# Patient Record
Sex: Female | Born: 1995 | Race: White | Hispanic: No | Marital: Married | Smoking: Former smoker
Health system: Southern US, Community
[De-identification: ages and names within clinical notes are randomized; demographics above are authoritative.]

## PROBLEM LIST (undated history)

## (undated) ENCOUNTER — Inpatient Hospital Stay: Payer: Self-pay

## (undated) DIAGNOSIS — D649 Anemia, unspecified: Secondary | ICD-10-CM

## (undated) DIAGNOSIS — R519 Headache, unspecified: Secondary | ICD-10-CM

## (undated) DIAGNOSIS — G90A Postural orthostatic tachycardia syndrome (POTS): Secondary | ICD-10-CM

## (undated) DIAGNOSIS — Z8489 Family history of other specified conditions: Secondary | ICD-10-CM

## (undated) DIAGNOSIS — O139 Gestational [pregnancy-induced] hypertension without significant proteinuria, unspecified trimester: Secondary | ICD-10-CM

## (undated) DIAGNOSIS — G35D Multiple sclerosis, unspecified: Secondary | ICD-10-CM

## (undated) DIAGNOSIS — F419 Anxiety disorder, unspecified: Secondary | ICD-10-CM

## (undated) DIAGNOSIS — E7800 Pure hypercholesterolemia, unspecified: Secondary | ICD-10-CM

## (undated) DIAGNOSIS — F329 Major depressive disorder, single episode, unspecified: Secondary | ICD-10-CM

## (undated) DIAGNOSIS — Z227 Latent tuberculosis: Secondary | ICD-10-CM

## (undated) DIAGNOSIS — G35 Multiple sclerosis: Secondary | ICD-10-CM

## (undated) HISTORY — PX: NO PAST SURGERIES: SHX2092

## (undated) HISTORY — DX: Gestational (pregnancy-induced) hypertension without significant proteinuria, unspecified trimester: O13.9

---
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
## 2018-01-07 ENCOUNTER — Emergency Department: Payer: Self-pay

## 2018-01-07 ENCOUNTER — Emergency Department
Admission: EM | Admit: 2018-01-07 | Discharge: 2018-01-07 | Disposition: A | Discharge status: Home / Self Care | Payer: Self-pay | Attending: Emergency Medicine | Admitting: Emergency Medicine

## 2018-01-07 DIAGNOSIS — Z87891 Personal history of nicotine dependence: Secondary | ICD-10-CM | POA: Insufficient documentation

## 2018-01-07 DIAGNOSIS — Z79899 Other long term (current) drug therapy: Secondary | ICD-10-CM | POA: Insufficient documentation

## 2018-01-07 DIAGNOSIS — R1031 Right lower quadrant pain: Secondary | ICD-10-CM | POA: Insufficient documentation

## 2018-01-07 DIAGNOSIS — I10 Essential (primary) hypertension: Secondary | ICD-10-CM | POA: Insufficient documentation

## 2018-01-07 LAB — COMPREHENSIVE METABOLIC PANEL
ALT: 29 U/L (ref 14–54)
AST: 23 U/L (ref 15–41)
Albumin: 4.1 g/dL (ref 3.5–5.0)
Alkaline Phosphatase: 75 U/L (ref 38–126)
Anion gap: 7 (ref 5–15)
BILIRUBIN TOTAL: 0.6 mg/dL (ref 0.3–1.2)
BUN: 9 mg/dL (ref 6–20)
CALCIUM: 8.8 mg/dL — AB (ref 8.9–10.3)
CHLORIDE: 106 mmol/L (ref 101–111)
CO2: 25 mmol/L (ref 22–32)
CREATININE: 0.55 mg/dL (ref 0.44–1.00)
Glucose, Bld: 98 mg/dL (ref 65–99)
Potassium: 3.6 mmol/L (ref 3.5–5.1)
Sodium: 138 mmol/L (ref 135–145)
TOTAL PROTEIN: 7.6 g/dL (ref 6.5–8.1)

## 2018-01-07 LAB — URINALYSIS, COMPLETE (UACMP) WITH MICROSCOPIC
Bilirubin Urine: NEGATIVE
GLUCOSE, UA: NEGATIVE mg/dL
HGB URINE DIPSTICK: NEGATIVE
KETONES UR: NEGATIVE mg/dL
NITRITE: NEGATIVE
PH: 6 (ref 5.0–8.0)
PROTEIN: NEGATIVE mg/dL
Specific Gravity, Urine: 1.011 (ref 1.005–1.030)

## 2018-01-07 LAB — CBC
HCT: 39.8 % (ref 35.0–47.0)
Hemoglobin: 13.1 g/dL (ref 12.0–16.0)
MCH: 27.5 pg (ref 26.0–34.0)
MCHC: 33 g/dL (ref 32.0–36.0)
MCV: 83.3 fL (ref 80.0–100.0)
PLATELETS: 295 10*3/uL (ref 150–440)
RBC: 4.77 MIL/uL (ref 3.80–5.20)
RDW: 13.1 % (ref 11.5–14.5)
WBC: 13.2 10*3/uL — AB (ref 3.6–11.0)

## 2018-01-07 LAB — WET PREP, GENITAL
Clue Cells Wet Prep HPF POC: NONE SEEN
SPERM: NONE SEEN
Trich, Wet Prep: NONE SEEN
YEAST WET PREP: NONE SEEN

## 2018-01-07 LAB — LIPASE, BLOOD: LIPASE: 42 U/L (ref 11–51)

## 2018-01-07 LAB — POCT PREGNANCY, URINE: Preg Test, Ur: NEGATIVE

## 2018-01-07 MED ORDER — TRAMADOL HCL 50 MG PO TABS: 50.0000 mg | ORAL_TABLET | Freq: Four times a day (QID) | ORAL | 0 refills | Status: AC | PRN

## 2018-01-07 MED ORDER — IOPAMIDOL (ISOVUE-300) INJECTION 61%
30.0000 mL | Freq: Once | INTRAVENOUS | Status: AC | PRN
  Administered 2018-01-07: 30 mL via ORAL
  Filled 2018-01-07: qty 30

## 2018-01-07 MED ORDER — IOPAMIDOL (ISOVUE-300) INJECTION 61%
100.0000 mL | Freq: Once | INTRAVENOUS | Status: AC | PRN
  Administered 2018-01-07: 100 mL via INTRAVENOUS
  Filled 2018-01-07: qty 100

## 2018-01-07 NOTE — ED Notes (Signed)
Patient transported to CT 

## 2018-01-07 NOTE — ED Provider Notes (Signed)
[facility] Emergency Department Provider Note   ____________________________________________    I have reviewed the triage vital signs and the nursing notes.   HISTORY  Chief Complaint Abdominal Pain     HPI Brenda Fuentes is a 22 y.o. female who presents with complaints of right lower quadrant abdominal pain which began gradually and has been relatively constant over the last 2 weeks but is been mild, today it seemed to be more moderate so the patient presented to the emergency department.  She reports normal stools.  No nausea or vomiting.  No history of abdominal surgery.  No vaginal discharge or dysuria.  Has not taken anything for this.   Past Medical History:  Diagnosis Date  . Anxiety   . Depression 2016  . Hypercholesteremia 2009  . Hypertension     Patient Active Problem List   Diagnosis Date Noted  . Indication for care in labor and delivery, antepartum 07/04/2016  . Indication for care in labor or delivery 07/02/2016  . Gestational hypertension w/o significant proteinuria in 3rd trimester 06/16/2016  . Abdominal pain in pregnancy 04/07/2016    Past Surgical History:  Procedure Laterality Date  . NO PAST SURGERIES      Prior to Admission medications   Medication Sig Start Date End Date Taking? Authorizing Provider  diclofenac (VOLTAREN) 75 MG EC tablet Take 1 tablet (75 mg total) by mouth 2 (two) times daily. 12/12/16   Menshew, Charlesetta Ivory, PA-C  ferrous sulfate 325 (65 FE) MG tablet Take 1 tablet (325 mg total) by mouth daily with breakfast. 07/06/16   Sigmon, Scarlette Slice, CNM  ibuprofen (ADVIL,MOTRIN) 600 MG tablet Take 1 tablet (600 mg total) by mouth every 6 (six) hours. 07/06/16   Karena Addison, CNM  oxyCODONE-acetaminophen (PERCOCET/ROXICET) 5-325 MG tablet Take 1-2 tablets by mouth every 4 (four) hours as needed (pain scale > 7). 07/06/16   Sigmon, Scarlette Slice, CNM  traMADol (ULTRAM) 50 MG tablet Take 1 tablet (50 mg  total) by mouth every 6 (six) hours as needed. 01/07/18 01/07/19  Jene Every, MD     Allergies Penicillins  History reviewed. No pertinent family history.  Social History Social History   Tobacco Use  . Smoking status: Former Smoker    Packs/day: 0.50  . Smokeless tobacco: Never Used  Substance Use Topics  . Alcohol use: No  . Drug use: No    Review of Systems  Constitutional: No fever/chills Eyes: No visual changes.  ENT: No sore throat. Cardiovascular: Denies chest pain. Respiratory: Denies shortness of breath. Gastrointestinal: As described above Genitourinary: Negative for dysuria. Musculoskeletal: Negative for back pain. Skin: Negative for rash. Neurological: Negative for headaches    ____________________________________________   PHYSICAL EXAM:  VITAL SIGNS: ED Triage Vitals  Enc Vitals Group     BP 01/07/18 1743 (!) 146/99     Pulse Rate 01/07/18 1743 (!) 106     Resp 01/07/18 1743 12     Temp 01/07/18 1743 98.5 F (36.9 C)     Temp Source 01/07/18 1743 Oral     SpO2 01/07/18 1743 98 %     Weight 01/07/18 1744 92.1 kg (203 lb)     Height 01/07/18 1744 1.676 m (5\' 6" )     Head Circumference --      Peak Flow --      Pain Score 01/07/18 1744 7     Pain Loc --      Pain Edu? --  Excl. in GC? --     Constitutional: Alert and oriented. No acute distress. Pleasant and interactive Eyes: Conjunctivae are normal.  Head: Atraumatic. Nose: No congestion/rhinnorhea. Mouth/Throat: Mucous membranes are moist.   Neck:  Painless ROM Cardiovascular: Normal rate, regular rhythm.   Good peripheral circulation. Respiratory: Normal respiratory effort.  No retractions.  Gastrointestinal: Tenderness palpation over McBurney's point which is mild, abdomen is soft no distention.  . Genitourinary: Pelvic exam unremarkable, no CMT or cervicitis Musculoskeletal:  Warm and well perfused Neurologic:  Normal speech and language. No gross focal neurologic deficits  are appreciated.  Skin:  Skin is warm, dry and intact. No rash noted. Psychiatric: Mood and affect are normal. Speech and behavior are normal.  ____________________________________________   LABS (all labs ordered are listed, but only abnormal results are displayed)  Labs Reviewed  WET PREP, GENITAL - Abnormal; Notable for the following components:      Result Value   WBC, Wet Prep HPF POC FEW (*)    All other components within normal limits  COMPREHENSIVE METABOLIC PANEL - Abnormal; Notable for the following components:   Calcium 8.8 (*)    All other components within normal limits  CBC - Abnormal; Notable for the following components:   WBC 13.2 (*)    All other components within normal limits  URINALYSIS, COMPLETE (UACMP) WITH MICROSCOPIC - Abnormal; Notable for the following components:   Color, Urine YELLOW (*)    Leukocytes, UA SMALL (*)    Bacteria, UA RARE (*)    Squamous Epithelial / LPF 0-5 (*)    All other components within normal limits  LIPASE, BLOOD  POC URINE PREG, ED  POCT PREGNANCY, URINE   ____________________________________________  EKG  None ____________________________________________  RADIOLOGY  CT abdomen pelvis negative for appendicitis ____________________________________________   PROCEDURES  Procedure(s) performed: No  Procedures   Critical Care performed: No ____________________________________________   INITIAL IMPRESSION / ASSESSMENT AND PLAN / ED COURSE  Pertinent labs & imaging results that were available during my care of the patient were reviewed by me and considered in my medical decision making (see chart for details).  Patient presented with right lower quadrant abdominal pain, time course not critically consistent with appendicitis however she did have mild tenderness to palpation at McBurney's point so CT scan performed which was unremarkable.  Pelvic exam reassuring as well.  Lab work normal.  Will treat with p.o.  analgesics and the patient follow-up with PCP for further workup as needed.  Return precautions discussed.  Mildly elevated white blood cell count nonspecific    ____________________________________________   FINAL CLINICAL IMPRESSION(S) / ED DIAGNOSES  Final diagnoses:  Right lower quadrant abdominal pain        Note:  This document was prepared using Dragon voice recognition software and may include unintentional dictation errors.    Jene Every, MD 01/07/18 2242

## 2018-01-07 NOTE — ED Triage Notes (Signed)
Pt presents via POV c/o RLQ pain x2 weeks. Reports some nausea, denies emesis.

## 2018-12-12 NOTE — L&D Delivery Note (Signed)
Delivery Note At  1041 am a viable and healthy female "[name]"  was delivered via  (Presentation:ROA ;  ).  APGAR: 8,9 ; weight pending skin-to-skin  .   Placenta status: delivered intact with 3 vessel  Cord:  with the following complications: none  Anesthesia:  epidural Episiotomy:  none Lacerations:  none Suture Repair: NA Est. Blood Loss (mL):    Mom to postpartum.  Baby to Couplet care / Skin to Skin.  Melody N Shambley 08/20/2019, 10:55 AM

## 2019-02-04 ENCOUNTER — Ambulatory Visit (INDEPENDENT_AMBULATORY_CARE_PROVIDER_SITE_OTHER): Payer: Medicaid Other | Admitting: Certified Nurse Midwife

## 2019-02-04 ENCOUNTER — Other Ambulatory Visit: Payer: Self-pay | Admitting: Certified Nurse Midwife

## 2019-02-04 VITALS — BP 121/81 | HR 87 | Ht 66.0 in | Wt 208.1 lb

## 2019-02-04 DIAGNOSIS — Z3401 Encounter for supervision of normal first pregnancy, first trimester: Secondary | ICD-10-CM

## 2019-02-04 LAB — OB RESULTS CONSOLE VARICELLA ZOSTER ANTIBODY, IGG: Varicella: NON-IMMUNE/NOT IMMUNE

## 2019-02-04 MED ORDER — DOXYLAMINE-PYRIDOXINE 10-10 MG PO TBEC: DELAYED_RELEASE_TABLET | ORAL | 1 refills | Status: DC

## 2019-02-04 NOTE — Progress Notes (Signed)
Brenda Fuentes presents for NOB nurse interview visit. Pregnancy confirmation done [facility] __1/20/20_______. G-2. P-1001. Pregnancy education material explained and given. __1__ cats in home. NOB labs ordered. TSH/HbgA1c ordered due to BMI 30 or greater. Sickle cell ordered due to patient's race. HIV labs and drug screen were explained and ordered. PNV encouraged. Genetic screening options discussed. Genetic testing:Unsure. Patient may discuss with the provider. Patient to follow up with provider in __2__ weeks for NOB physical. All questions answered. Patient wants to see the midwife side. Patient had hypertension during last pregnancy. She delivered 36 weeks.

## 2019-02-05 LAB — PARVOVIRUS B19 ANTIBODY, IGG AND IGM
Parvovirus B19 IgG: 0.3 index (ref 0.0–0.8)
Parvovirus B19 IgM: 0.2 index (ref 0.0–0.8)

## 2019-02-05 LAB — MONITOR DRUG PROFILE 14(MW)
AMPHETAMINE SCREEN URINE: NEGATIVE ng/mL
BARBITURATE SCREEN URINE: NEGATIVE ng/mL
BENZODIAZEPINE SCREEN, URINE: NEGATIVE ng/mL
BUPRENORPHINE, URINE: NEGATIVE ng/mL
CANNABINOIDS UR QL SCN: NEGATIVE ng/mL
COCAINE(METAB.)SCREEN, URINE: NEGATIVE ng/mL
Creatinine(Crt), U: 178 mg/dL (ref 20.0–300.0)
FENTANYL, URINE: NEGATIVE pg/mL
MEPERIDINE SCREEN, URINE: NEGATIVE ng/mL
Methadone Screen, Urine: NEGATIVE ng/mL
OPIATE SCREEN URINE: NEGATIVE ng/mL
OXYCODONE+OXYMORPHONE UR QL SCN: NEGATIVE ng/mL
PHENCYCLIDINE QUANTITATIVE URINE: NEGATIVE ng/mL
PROPOXYPHENE SCREEN URINE: NEGATIVE ng/mL
Ph of Urine: 6.2 (ref 4.5–8.9)
SPECIFIC GRAVITY: 1.023
TRAMADOL SCREEN, URINE: NEGATIVE ng/mL

## 2019-02-05 LAB — NICOTINE SCREEN, URINE: Cotinine Ql Scrn, Ur: NEGATIVE ng/mL

## 2019-02-05 LAB — URINALYSIS, ROUTINE W REFLEX MICROSCOPIC
BILIRUBIN UA: NEGATIVE
GLUCOSE, UA: NEGATIVE
Ketones, UA: NEGATIVE
LEUKOCYTES UA: NEGATIVE
Nitrite, UA: NEGATIVE
PH UA: 6.5 (ref 5.0–7.5)
RBC UA: NEGATIVE
Specific Gravity, UA: 1.026 (ref 1.005–1.030)
UUROB: 0.2 mg/dL (ref 0.2–1.0)

## 2019-02-05 LAB — CBC
HEMOGLOBIN: 12.8 g/dL (ref 11.1–15.9)
Hematocrit: 39.1 % (ref 34.0–46.6)
MCH: 27.6 pg (ref 26.6–33.0)
MCHC: 32.7 g/dL (ref 31.5–35.7)
MCV: 84 fL (ref 79–97)
PLATELETS: 294 10*3/uL (ref 150–450)
RBC: 4.63 x10E6/uL (ref 3.77–5.28)
RDW: 12.8 % (ref 11.7–15.4)
WBC: 8.6 10*3/uL (ref 3.4–10.8)

## 2019-02-05 LAB — ANTIBODY SCREEN: Antibody Screen: NEGATIVE

## 2019-02-05 LAB — RUBELLA SCREEN: RUBELLA: 1.38 {index} (ref >0.99)

## 2019-02-05 LAB — HEMOGLOBIN A1C
ESTIMATED AVERAGE GLUCOSE: 100 mg/dL
HEMOGLOBIN A1C: 5.1 % (ref 4.8–5.6)

## 2019-02-05 LAB — GC/CHLAMYDIA PROBE AMP
Chlamydia trachomatis, NAA: NEGATIVE
NEISSERIA GONORRHOEAE BY PCR: NEGATIVE

## 2019-02-05 LAB — ABO AND RH: RH TYPE: POSITIVE

## 2019-02-05 LAB — VARICELLA ZOSTER ANTIBODY, IGG: Varicella zoster IgG: <135 index — ABNORMAL LOW (ref >165)

## 2019-02-05 LAB — HEPATITIS B SURFACE ANTIGEN: Hepatitis B Surface Ag: NEGATIVE

## 2019-02-05 LAB — RPR: RPR: NONREACTIVE

## 2019-02-05 LAB — HIV ANTIBODY (ROUTINE TESTING W REFLEX): HIV Screen 4th Generation wRfx: NONREACTIVE

## 2019-02-06 LAB — CULTURE, OB URINE

## 2019-02-06 LAB — URINE CULTURE, OB REFLEX

## 2019-02-18 ENCOUNTER — Ambulatory Visit (INDEPENDENT_AMBULATORY_CARE_PROVIDER_SITE_OTHER): Payer: Medicaid Other | Admitting: Certified Nurse Midwife

## 2019-02-18 ENCOUNTER — Other Ambulatory Visit (HOSPITAL_COMMUNITY)
Admission: RE | Admit: 2019-02-18 | Discharge: 2019-02-18 | Disposition: A | Discharge status: Home / Self Care | Payer: Medicaid Other | Source: Ambulatory Visit | Attending: Certified Nurse Midwife | Admitting: Certified Nurse Midwife

## 2019-02-18 ENCOUNTER — Encounter: Payer: Self-pay | Admitting: Certified Nurse Midwife

## 2019-02-18 VITALS — BP 121/82 | HR 83 | Wt 205.2 lb

## 2019-02-18 DIAGNOSIS — Z8751 Personal history of pre-term labor: Secondary | ICD-10-CM | POA: Insufficient documentation

## 2019-02-18 DIAGNOSIS — Z3401 Encounter for supervision of normal first pregnancy, first trimester: Secondary | ICD-10-CM | POA: Diagnosis present

## 2019-02-18 DIAGNOSIS — O09291 Supervision of pregnancy with other poor reproductive or obstetric history, first trimester: Secondary | ICD-10-CM

## 2019-02-18 DIAGNOSIS — O09211 Supervision of pregnancy with history of pre-term labor, first trimester: Secondary | ICD-10-CM

## 2019-02-18 DIAGNOSIS — Z3A10 10 weeks gestation of pregnancy: Secondary | ICD-10-CM | POA: Diagnosis not present

## 2019-02-18 DIAGNOSIS — O09899 Supervision of other high risk pregnancies, unspecified trimester: Secondary | ICD-10-CM | POA: Insufficient documentation

## 2019-02-18 DIAGNOSIS — Z8759 Personal history of other complications of pregnancy, childbirth and the puerperium: Secondary | ICD-10-CM | POA: Insufficient documentation

## 2019-02-18 LAB — POCT URINALYSIS DIPSTICK OB
BILIRUBIN UA: NEGATIVE
Glucose, UA: NEGATIVE
Leukocytes, UA: NEGATIVE
NITRITE UA: NEGATIVE
PROTEIN: NEGATIVE
SPEC GRAV UA: 1.025 (ref 1.010–1.025)
Urobilinogen, UA: 0.2 E.U./dL
pH, UA: 5 (ref 5.0–8.0)

## 2019-02-18 NOTE — Progress Notes (Signed)
NEW OB HISTORY AND PHYSICAL  SUBJECTIVE:       Brenda Fuentes is a 23 y.o. [id number] female, Patient's last menstrual period was 12/04/2018., Estimated Date of Delivery: 09/10/19, [redacted]w[redacted]d, presents today for establishment of Prenatal Care. She has no unusual complaints    Gynecologic History Patient's last menstrual period was 12/04/2018. Normal Contraception: none Last Pap: n/a.  Body mass index is 33.12 kg/m.  Obstetric History OB History  Gravida Para Term Preterm AB Living  2 1   1   1   SAB TAB Ectopic Multiple Live Births        0 1    # Outcome Date GA Lbr Len/2nd Weight Sex Delivery Anes PTL Lv  2 Current           1 Preterm 07/04/16 [redacted]w[redacted]d 14:25 / 00:23 5 lb 10.3 oz (2.56 kg) M Vag-Spont EPI  LIV    Past Medical History:  Diagnosis Date  . Anxiety   . Depression 2016  . Hypercholesteremia 2009  . Hypertension     Past Surgical History:  Procedure Laterality Date  . NO PAST SURGERIES      Current Outpatient Medications on File Prior to Visit  Medication Sig Dispense Refill  . Doxylamine-Pyridoxine (DICLEGIS) 10-10 MG TBEC Take one daily for nausea. 60 tablet 1  . Prenatal Vit-Fe Fumarate-FA (PRENATAL MULTIVITAMIN) TABS tablet Take 1 tablet by mouth daily at 12 noon.     No current facility-administered medications on file prior to visit.     Allergies  Allergen Reactions  . Penicillins Hives    Social History   Socioeconomic History  . Marital status: Married    Spouse name: Not on file  . Number of children: Not on file  . Years of education: Not on file  . Highest education level: Not on file  Occupational History  . Not on file  Social Needs  . Financial resource strain: Not on file  . Food insecurity:    Worry: Not on file    Inability: Not on file  . Transportation needs:    Medical: Not on file    Non-medical: Not on file  Tobacco Use  . Smoking status: Former Smoker    Packs/day: 0.50  . Smokeless tobacco: Never Used  Substance and  Sexual Activity  . Alcohol use: No  . Drug use: No  . Sexual activity: Yes  Lifestyle  . Physical activity:    Days per week: Not on file    Minutes per session: Not on file  . Stress: Not on file  Relationships  . Social connections:    Talks on phone: Not on file    Gets together: Not on file    Attends religious service: Not on file    Active member of club or organization: Not on file    Attends meetings of clubs or organizations: Not on file    Relationship status: Not on file  . Intimate partner violence:    Fear of current or ex partner: Not on file    Emotionally abused: Not on file    Physically abused: Not on file    Forced sexual activity: Not on file  Other Topics Concern  . Not on file  Social History Narrative  . Not on file    Family History  Problem Relation Age of Onset  . Hyperlipidemia Mother   . Heart failure Father   . Diabetes Father     The following portions of the  patient's history were reviewed and updated as appropriate: allergies, current medications, past OB history, past medical history, past surgical history, past family history, past social history, and problem list.    OBJECTIVE: Initial Physical Exam (New OB)  GENERAL APPEARANCE: alert, well appearing HEAD: normocephalic, atraumatic MOUTH: mucous membranes moist, pharynx normal without lesions THYROID: no thyromegaly or masses present BREASTS: no masses noted, no significant tenderness, no palpable axillary nodes, no skin changes LUNGS: clear to auscultation, no wheezes, rales or rhonchi, symmetric air entry HEART: regular rate and rhythm, no murmurs ABDOMEN: soft, nontender, nondistended, no abnormal masses, no epigastric pain EXTREMITIES: no redness or tenderness in the calves or thighs SKIN: normal coloration and turgor, no rashes LYMPH NODES: no adenopathy palpable NEUROLOGIC: alert, oriented, normal speech, no focal findings or movement disorder noted  PELVIC EXAM EXTERNAL  GENITALIA: normal appearing vulva with no masses, tenderness or lesions VAGINA: no abnormal discharge or lesions CERVIX: no lesions or cervical motion tenderness, pap collected. Contact bleeding  UTERUS: gravid, FHT 160 ADNEXA: no masses palpable and nontender OB EXAM PELVIMETRY: appears adequate RECTUM: exam not indicated  ASSESSMENT: Normal pregnancy  PLAN: New OB counseling: The patient has been given an overview regarding routine prenatal care. Recommendations regarding diet, weight gain, and exercise in pregnancy were given. Prenatal testing, optional genetic testing, carrier screening and ultrasound use in pregnancy were reviewed. Panarama testing today. Discussed 17 p injections, Asprin @ 16 wks, early glucose screening. U/s ordered for dating and viability. Benefits of Breast Feeding were discussed. The patient is encouraged to consider nursing her baby post partum.Follow up 4 wks.   Doreene Burke, CNM

## 2019-02-18 NOTE — Patient Instructions (Signed)

## 2019-02-19 LAB — TSH: TSH: 2.74 u[IU]/mL (ref 0.450–4.500)

## 2019-02-20 LAB — CYTOLOGY - PAP: DIAGNOSIS: NEGATIVE

## 2019-02-21 ENCOUNTER — Encounter: Payer: Self-pay | Admitting: *Deleted

## 2019-02-21 ENCOUNTER — Other Ambulatory Visit: Payer: Self-pay | Admitting: Certified Nurse Midwife

## 2019-02-21 ENCOUNTER — Emergency Department: Payer: Medicaid Other

## 2019-02-21 ENCOUNTER — Emergency Department
Admission: EM | Admit: 2019-02-21 | Discharge: 2019-02-21 | Disposition: A | Discharge status: Home / Self Care | Payer: Medicaid Other | Attending: Emergency Medicine | Admitting: Emergency Medicine

## 2019-02-21 ENCOUNTER — Telehealth: Payer: Self-pay | Admitting: Certified Nurse Midwife

## 2019-02-21 ENCOUNTER — Other Ambulatory Visit: Payer: Self-pay

## 2019-02-21 ENCOUNTER — Ambulatory Visit
Admission: RE | Admit: 2019-02-21 | Discharge: 2019-02-21 | Disposition: A | Discharge status: Home / Self Care | Payer: Medicaid Other | Source: Ambulatory Visit | Attending: Certified Nurse Midwife | Admitting: Certified Nurse Midwife

## 2019-02-21 DIAGNOSIS — R079 Chest pain, unspecified: Secondary | ICD-10-CM | POA: Insufficient documentation

## 2019-02-21 DIAGNOSIS — Z3401 Encounter for supervision of normal first pregnancy, first trimester: Secondary | ICD-10-CM

## 2019-02-21 DIAGNOSIS — Z87891 Personal history of nicotine dependence: Secondary | ICD-10-CM | POA: Insufficient documentation

## 2019-02-21 DIAGNOSIS — Z3A11 11 weeks gestation of pregnancy: Secondary | ICD-10-CM | POA: Diagnosis not present

## 2019-02-21 DIAGNOSIS — Z79899 Other long term (current) drug therapy: Secondary | ICD-10-CM | POA: Diagnosis not present

## 2019-02-21 DIAGNOSIS — O219 Vomiting of pregnancy, unspecified: Secondary | ICD-10-CM | POA: Insufficient documentation

## 2019-02-21 DIAGNOSIS — O26891 Other specified pregnancy related conditions, first trimester: Secondary | ICD-10-CM | POA: Insufficient documentation

## 2019-02-21 LAB — CBC
HEMATOCRIT: 39.4 % (ref 36.0–46.0)
HEMOGLOBIN: 12.9 g/dL (ref 12.0–15.0)
MCH: 27.6 pg (ref 26.0–34.0)
MCHC: 32.7 g/dL (ref 30.0–36.0)
MCV: 84.2 fL (ref 80.0–100.0)
Platelets: 295 10*3/uL (ref 150–400)
RBC: 4.68 MIL/uL (ref 3.87–5.11)
RDW: 12.5 % (ref 11.5–15.5)
WBC: 14.3 10*3/uL — ABNORMAL HIGH (ref 4.0–10.5)
nRBC: 0 % (ref 0.0–0.2)

## 2019-02-21 LAB — BASIC METABOLIC PANEL
Anion gap: 10 (ref 5–15)
BUN: 6 mg/dL (ref 6–20)
CALCIUM: 9.1 mg/dL (ref 8.9–10.3)
CO2: 20 mmol/L — ABNORMAL LOW (ref 22–32)
CREATININE: 0.4 mg/dL — AB (ref 0.44–1.00)
Chloride: 106 mmol/L (ref 98–111)
GFR calc Af Amer: >60 mL/min (ref >60)
GLUCOSE: 98 mg/dL (ref 70–99)
Potassium: 3.8 mmol/L (ref 3.5–5.1)
Sodium: 136 mmol/L (ref 135–145)

## 2019-02-21 LAB — TROPONIN I
Troponin I: <0.03 ng/mL (ref <0.03)
Troponin I: <0.03 ng/mL (ref <0.03)

## 2019-02-21 LAB — FIBRIN DERIVATIVES D-DIMER (ARMC ONLY): Fibrin derivatives D-dimer (ARMC): 284.53 ng/mL (FEU) (ref 0.00–499.00)

## 2019-02-21 MED ORDER — SODIUM CHLORIDE 0.9% FLUSH: 3.0000 mL | Freq: Once | INTRAVENOUS | Status: DC

## 2019-02-21 MED ORDER — LIDOCAINE VISCOUS HCL 2 % MT SOLN
15.0000 mL | Freq: Once | OROMUCOSAL | Status: AC
  Administered 2019-02-21: 15 mL via ORAL
  Filled 2019-02-21: qty 15

## 2019-02-21 MED ORDER — ALUM & MAG HYDROXIDE-SIMETH 200-200-20 MG/5ML PO SUSP
30.0000 mL | Freq: Once | ORAL | Status: AC
Start: 2019-02-21 — End: 2019-02-21
  Administered 2019-02-21: 30 mL via ORAL
  Filled 2019-02-21: qty 30

## 2019-02-21 MED ORDER — ALUM & MAG HYDROXIDE-SIMETH 400-400-40 MG/5ML PO SUSP: 5.0000 mL | Freq: Four times a day (QID) | ORAL | 0 refills | Status: DC | PRN

## 2019-02-21 MED ORDER — FAMOTIDINE 20 MG PO TABS: 20.0000 mg | ORAL_TABLET | Freq: Two times a day (BID) | ORAL | 1 refills | Status: DC

## 2019-02-21 NOTE — Discharge Instructions (Addendum)

## 2019-02-21 NOTE — ED Notes (Signed)
Pt signed hard copy for discharge 

## 2019-02-21 NOTE — ED Provider Notes (Signed)
[facility] Emergency Department Provider Note   ____________________________________________   I have reviewed the triage vital signs and the nursing notes.   HISTORY  Chief Complaint Chest Pain   History limited by: Not Limited   HPI Brenda Fuentes is a 23 y.o. female who presents to the emergency department today because of concerns for chest pain.  The patient states that the pain started this morning.  Located in the center part of her chest.  She describes as pressure-like.  She did vomit this morning with some relief of the pain.  The pain however then returned.  She states it is worse when she lies flat.  Somewhat relieved when she sits up.  The patient denies any fevers.  Denies any leg swelling.  Denies similar pain in the past.  Is roughly [redacted] weeks pregnant.   Per medical record review patient has a history of depression, anxiety.  Past Medical History:  Diagnosis Date  . Anxiety   . Depression 2016  . Gestational hypertension   . Hypercholesteremia 2009    Patient Active Problem List   Diagnosis Date Noted  . History of preterm delivery 02/18/2019  . History of gestational hypertension 02/18/2019    Past Surgical History:  Procedure Laterality Date  . NO PAST SURGERIES      Prior to Admission medications   Medication Sig Start Date End Date Taking? Authorizing Provider  Doxylamine-Pyridoxine (DICLEGIS) 10-10 MG TBEC Take one daily for nausea. 02/04/19   Doreene Burke, CNM  Prenatal Vit-Fe Fumarate-FA (PRENATAL MULTIVITAMIN) TABS tablet Take 1 tablet by mouth daily at 12 noon.    [provider]    Allergies Penicillins  Family History  Problem Relation Age of Onset  . Hyperlipidemia Mother   . Heart failure Father   . Diabetes Father     Social History Social History   Tobacco Use  . Smoking status: Former Smoker    Packs/day: 0.50  . Smokeless tobacco: Never Used  Substance Use Topics  . Alcohol use: No   . Drug use: No    Review of Systems Constitutional: No fever/chills Eyes: No visual changes. ENT: No sore throat. Cardiovascular: Positive for chest pain. Respiratory: Positive for shortness of breath. Gastrointestinal: No abdominal pain. Positive for emesis.  Genitourinary: Negative for dysuria. Musculoskeletal: Negative for back pain. Skin: Negative for rash. Neurological: Negative for headaches, focal weakness or numbness.  ____________________________________________   PHYSICAL EXAM:  VITAL SIGNS: ED Triage Vitals  Enc Vitals Group     BP 02/21/19 1119 132/79     Pulse Rate 02/21/19 1119 87     Resp 02/21/19 1119 16     Temp 02/21/19 1119 98 F (36.7 C)     Temp Source 02/21/19 1119 Oral     SpO2 02/21/19 1119 98 %     Weight 02/21/19 1120 205 lb (93 kg)     Height 02/21/19 1120 5\' 6"  (1.676 m)     Head Circumference --      Peak Flow --      Pain Score 02/21/19 1120 6    Constitutional: Alert and oriented.  Eyes: Conjunctivae are normal.  ENT      Head: Normocephalic and atraumatic.      Nose: No congestion/rhinnorhea.      Mouth/Throat: Mucous membranes are moist.      Neck: No stridor. Hematological/Lymphatic/Immunilogical: No cervical lymphadenopathy. Cardiovascular: Normal rate, regular rhythm.  No murmurs, rubs, or gallops.  Respiratory: Normal respiratory effort  without tachypnea nor retractions. Breath sounds are clear and equal bilaterally. No wheezes/rales/rhonchi. Gastrointestinal: Soft and non tender. No rebound. No guarding.  Genitourinary: Deferred Musculoskeletal: Normal range of motion in all extremities. No lower extremity edema. Neurologic:  Normal speech and language. No gross focal neurologic deficits are appreciated.  Skin:  Skin is warm, dry and intact. No rash noted. Psychiatric: Mood and affect are normal. Speech and behavior are normal. Patient exhibits appropriate insight and  judgment.  ____________________________________________    LABS (pertinent positives/negatives)  Trop <0.03 CBC wbc 14.3, hgb 12.9, plt 295 BMP wnl except co2 20, cr 0.40  ____________________________________________   EKG  I, Phineas Semen, attending physician, personally viewed and interpreted this EKG  EKG Time: 1126 Rate: 92 Rhythm: normal sinus rhythm Axis: normal Intervals: qtc 432 QRS: narrow ST changes: no st elevation, t wave inversion III, V1 Impression: abnormal ekg   ____________________________________________    RADIOLOGY  CXR No acute findings ____________________________________________   PROCEDURES  Procedures  ____________________________________________   INITIAL IMPRESSION / ASSESSMENT AND PLAN / ED COURSE  Pertinent labs & imaging results that were available during my care of the patient were reviewed by me and considered in my medical decision making (see chart for details).   Patient presented to the emergency department today because of concerns for shortness of breath and chest pain which she describes as pressure.  Differential would be broad including esophagitis, gastritis, PE, pneumonia, ACS amongst other etiologies. Discussed some of the concerning diagnosis with the patient. The patient was initially given a GI cocktail without any relief. CXR was performed which did not show any pneumonia or enlarged heart. Blood work without elevation of troponin. Mild leukocytosis. Discussed possibility of blood clot with patient. Given shortness of breath and chest pain will send off d-dimer. Did discuss with patient increased radiation with further testing if positive.    ____________________________________________   FINAL CLINICAL IMPRESSION(S) / ED DIAGNOSES  Chest pain  Shortness of breath  Note: This dictation was prepared with Dragon dictation. Any transcriptional errors that result from this process are unintentional      Phineas Semen, MD 02/21/19 1501

## 2019-02-21 NOTE — ED Notes (Signed)
Pt resting quietly in the bed. Family at the bedside. No concerns at this time. Will continue to assess.

## 2019-02-21 NOTE — ED Provider Notes (Signed)
_________________________ 4:04 PM on 02/21/2019 -----------------------------------------  Assumed care of this patient from Dr. Derrill Kay at 3 PM.  Plan to follow-up results of d-dimer and repeat troponin.  D-dimer is negative.  Repeat troponin is negative.  Patient reports improvement of her symptoms.  At this time she is stable for discharge home.  Discussed standard return precautions and close follow-up with her OB and primary care doctor.   Don Perking, [place], MD 02/21/19 [phone]

## 2019-02-21 NOTE — ED Triage Notes (Signed)
Pt is here for CP.  Pt states that she woke up this am and her chest feels "uncomfortable to lay down and it feels like a huge cinder block is on my chest and it feels like its harder to take a breath".  Pt appears anxious.  No recent travel.  States that she is [redacted] weeks pregnant, she sees OBGYN with [facility].

## 2019-02-21 NOTE — ED Notes (Signed)
Pt reports that she started this am with chest pain and shortness of breath - she reports that she vomited x1 and that seemed to relieve the chest pain - she reports hx of anxiety but not treated currently due to [redacted] wk pregnant - pt reports runny nose, nasal congestion, and diarrhea for the last week - her son had a fever last week - denies any travel or exposure to anyone sick other than son

## 2019-02-21 NOTE — ED Notes (Signed)
Blue top sent to lab with extra label

## 2019-02-21 NOTE — Telephone Encounter (Signed)
The patient called and stated that she needs to be seen today the patient is experiencing elevated BP, a head cold, and her chest is heavy. The patient did not disclose any other information. Please advise.

## 2019-02-27 ENCOUNTER — Telehealth: Payer: Self-pay

## 2019-02-27 ENCOUNTER — Other Ambulatory Visit: Payer: Self-pay | Admitting: Certified Nurse Midwife

## 2019-02-27 ENCOUNTER — Telehealth: Payer: Self-pay | Admitting: Obstetrics and Gynecology

## 2019-02-27 MED ORDER — RANITIDINE HCL 75 MG PO TABS: 75.0000 mg | ORAL_TABLET | Freq: Two times a day (BID) | ORAL | 3 refills | Status: DC

## 2019-02-27 NOTE — Progress Notes (Signed)
Pt having significant heartburn and reflux. Orders placed for zantac.   Doreene Burke, CNM

## 2019-02-27 NOTE — Telephone Encounter (Signed)
Jasmine December , will you let her know I order her zantac.   Thanks,  Pattricia Boss

## 2019-02-27 NOTE — Telephone Encounter (Signed)
The patient states she went to the ER on Thursday and followed up with her PCP, but not her OB, and she states the Pepcid is not working and the Maalox is not working.  Still having chest pain and burning, and wants to speak to her nurse about options, please advise, thanks.

## 2019-02-27 NOTE — Telephone Encounter (Signed)
mychart message sent - ATs order

## 2019-02-27 NOTE — Telephone Encounter (Signed)
pls advise

## 2019-02-28 ENCOUNTER — Telehealth: Payer: Self-pay

## 2019-02-28 MED ORDER — ONDANSETRON 4 MG PO TBDP: 4.0000 mg | ORAL_TABLET | Freq: Three times a day (TID) | ORAL | 1 refills | Status: DC | PRN

## 2019-02-28 NOTE — Telephone Encounter (Signed)
I sent some zofran in for you. Also we got a fax from [facility] the lab that did the genetic testing and there was not enough fetal DNA in the sample we submitted for testing so they will reach out to you to repeat another draw.

## 2019-03-18 ENCOUNTER — Telehealth: Payer: Self-pay

## 2019-03-18 NOTE — Telephone Encounter (Signed)
Coronavirus (COVID-19) Are you at risk?  Are you at risk for the Coronavirus (COVID-19)?  To be considered HIGH RISK for Coronavirus (COVID-19), you have to meet the following criteria:  . Traveled to [place], [place], [place], [place] or [place]; or in the [place] to [place], [place], [place], or [place]; and have fever, cough, and shortness of breath within the last 2 weeks of travel OR . Been in close contact with a person diagnosed with COVID-19 within the last 2 weeks and have fever, cough, and shortness of breath . IF YOU DO NOT MEET THESE CRITERIA, YOU ARE CONSIDERED LOW RISK FOR COVID-19.  What to do if you are HIGH RISK for COVID-19?  . If you are having a medical emergency, call 911. . Seek medical care right away. Before you go to a doctor's office, urgent care or emergency department, call ahead and tell them about your recent travel, contact with someone diagnosed with COVID-19, and your symptoms. You should receive instructions from your physician's office regarding next steps of care.  . When you arrive at healthcare provider, tell the healthcare staff immediately you have returned from visiting [place], [place], [place], [place] or [place]; or traveled in the [place] to [place], [place], [place], or [place]; in the last two weeks or you have been in close contact with a person diagnosed with COVID-19 in the last 2 weeks.   . Tell the health care staff about your symptoms: fever, cough and shortness of breath. . After you have been seen by a medical provider, you will be either: o Tested for (COVID-19) and discharged home on quarantine except to seek medical care if symptoms worsen, and asked to  - Stay home and avoid contact with others until you get your results (4-5 days)  - Avoid travel on public transportation if possible (such as bus, train, or airplane) or o Sent to the Emergency Department by EMS for evaluation, COVID-19 testing, and possible  admission depending on your condition and test results.  What to do if you are LOW RISK for COVID-19?  Reduce your risk of any infection by using the same precautions used for avoiding the common cold or flu:  . Wash your hands often with soap and warm water for at least 20 seconds.  If soap and water are not readily available, use an alcohol-based hand sanitizer with at least 60% alcohol.  . If coughing or sneezing, cover your mouth and nose by coughing or sneezing into the elbow areas of your shirt or coat, into a tissue or into your sleeve (not your hands). . Avoid shaking hands with others and consider head nods or verbal greetings only. . Avoid touching your eyes, nose, or mouth with unwashed hands.  . Avoid close contact with people who are Brenda Fuentes. . Avoid places or events with large numbers of people in one location, like concerts or sporting events. . Carefully consider travel plans you have or are making. . If you are planning any travel outside or inside the [place], visit the CDC's Travelers' Health webpage for the latest health notices. . If you have some symptoms but not all symptoms, continue to monitor at home and seek medical attention if your symptoms worsen. . If you are having a medical emergency, call 911. 03/18/19 SCREENING NEG SLS  ADDITIONAL HEALTHCARE OPTIONS FOR PATIENTS  [facility] / e-Visit: [email or web address]         [facility]: [phone]    [facility]: [phone]                   [facility]: [phone]  

## 2019-03-18 NOTE — Telephone Encounter (Signed)
covid

## 2019-03-19 ENCOUNTER — Other Ambulatory Visit: Payer: Self-pay

## 2019-03-19 ENCOUNTER — Ambulatory Visit (INDEPENDENT_AMBULATORY_CARE_PROVIDER_SITE_OTHER): Payer: Medicaid Other | Admitting: Obstetrics and Gynecology

## 2019-03-19 ENCOUNTER — Encounter: Payer: Self-pay | Admitting: Obstetrics and Gynecology

## 2019-03-19 ENCOUNTER — Other Ambulatory Visit: Payer: Medicaid Other

## 2019-03-19 VITALS — BP 122/80 | HR 96 | Wt 203.2 lb

## 2019-03-19 DIAGNOSIS — Z3492 Encounter for supervision of normal pregnancy, unspecified, second trimester: Secondary | ICD-10-CM

## 2019-03-19 LAB — POCT URINALYSIS DIPSTICK OB
Bilirubin, UA: NEGATIVE
Blood, UA: NEGATIVE
Glucose, UA: NEGATIVE
Ketones, UA: NEGATIVE
Nitrite, UA: NEGATIVE
POC,PROTEIN,UA: NEGATIVE
Spec Grav, UA: 1.015 (ref 1.010–1.025)
Urobilinogen, UA: 0.2 E.U./dL
pH, UA: 6 (ref 5.0–8.0)

## 2019-03-19 NOTE — Progress Notes (Signed)
ROB- pt is doing well 

## 2019-03-19 NOTE — Patient Instructions (Signed)
                                                                                                                 FREQUENTLY ASKED QUESTIONS FOR OBSTETRICS/PEDIATRICS    Q: Why are visitor restrictions different for maternity care areas?  [facility] is restricting visitors for the duration of the patient's hospitalization. The birth of a child involves the mother, considered the patient, and a birthing partner. These are unprecedented times and we are making the exception to allow a birthing partner to be a part of the patient unit. No other guests will be allowed in our [facility] at [facility] and at  [facility].   Q: Are credentialed doulas allowed to support their existing patients?  We acknowledge the value these doula partnerships offer our care teams and many birthing families in our communities. Each laboring mother is allowed one birthing partner of the patient's choosing for her entire hospitalization.   Q: Are visitor restrictions different for hospitalized children?  Pediatric patients (infants and children under 17 years of age), such as those in the Children's Unit, Pediatric ICU and NICU, will be allowed two visitors (parents or legal guardians)   Q: Are pregnant women at an increased risk for COVID-19?  The American College of Obstetricians and Gynecologists (ACOG) is monitoring closely the coronavirus pandemic. With the limited information available, data does not indicate pregnant women are at an increased risk. However, pregnant women are known to be at greater risk for respiratory infections like flu. With that in mind, expectant mothers are considered an at-risk population for COVID-19, according to ACOG.   Q: Are newborns at an increased risk for COVID-19?  A limited sample of COVID-19 data with newborns indicates the virus is not transferred to the infant during pregnancy. However, postpartum separation is recommended by the Centers for  Disease Control (CDC). As a result [facility] recommends and strongly encourages temporary separation of moms and babies who test positive for COVID-19 or are awaiting results to rule out COVID-19 based on CDC guidelines.   Q: If you have a suspected case of COVID-19, is the NICU couplet care room an option?  No. If either patient is considered at-risk for having COVID-19, the [facility] at [facility] will not use the NICU couplet care rooms for that family.   Q: [facility] is urging that elective procedures be postponed. What is considered elective for women's and children's service line?  NOT ELECTIVE: Obstetric procedures, even those with an element of choice on timing, are not considered elective. Circumcisions are considered elective procedures, however, these do not deplete blood products and other resources, which is the spirit in which the COVID-19 postponement of elective procedures was intended. Therefore, circumcisions will be allowed.   ELECTIVE: Postpartum tubal ligations are considered elective and should be postponed. Q&A for Obstetricians, Gynecologists and Pediatricians  Published March 01, 2019   [facility] supports as much as possible the medical care   team working with the patient's individual needs to address timing during these unprecedented times. We seek the support of our medical care team in preserving needed resources throughout our crisis response to COVID-19.   Q: How does COVID-19 impact breastfeeding?  Breastmilk is safe for your baby - even if the mother has tested positive for COVID-19. If a COVID-19+ mother decides to breastfeed while inpatient and after discharge, we suggest proper protective equipment be worn and hand hygiene be performed before and after feeding the infant. The new mother also has the option to pump her milk and have a healthy family member feed the baby to protect the baby from getting the virus.   Q: Should we urge  patients to avoid baby showers and large gatherings?  Yes. As has been recommended for all citizens in our communities, gatherings of 10 or more should be avoided - pregnant or not. Seek creative options for "hosting" baby showers through electronic means that honor the request for social distancing during this time of heightened awareness.   Q: Should patients miss their prenatal appointments?  No. Prenatal visits are NOT elective. While we want to limit contact and exposure, prenatal care is vital right now. Contact your physician's office if you have concerns about your visits. We are limiting outpatient office visits to the patient and one guest in order to reduce the potential for exposure.   Q: What if a pregnant woman feels sick? Should she miss her prenatal visit then?  A pregnant woman experiencing coronavirus-like symptoms (i.e., cough, fever, difficulty breathing, shortness of breath, gastrointestinal issues) should contact her pregnancy care provider by phone. Her medical professional can best determine whether she should use a video visit or possibly go to a collection site to be tested for COVID-19. Contacting her primary care provider or her pregnancy care provider is her first step.   Q: What can I do about childbirth education? All the classes are cancelled.  The [facility] will offer online learning to support mothers on their journey. We currently offer Understanding Childbirth, Understanding Breastfeeding and Understanding Newborn Care as an online class. Please visit our website, [email or web address], to register for an online class.   Q: How can I keep from getting COVID-19? Q&A for Obstetricians, Gynecologists and Pediatricians  Published March 01, 2019   Together, we can reduce the risk of exposure to the virus and help you and your family remain healthy and safe. One of the best ways to protect yourself is to wash your hands frequently using soap and  water. Also, you should avoid touching your eyes, nose and mouth with unwashed hands, avoid physical contact with others and practice social distancing.   Q: How are employees being informed about what to do?  [facility] leaders receive a daily COVID-19 update and share relevant information with their teams. This is a time when health care professionals are called on to lead within our community. We appreciate our staff's engagement with our COVID-19 updates and encourage them to share best practices on reducing the spread of the virus with our patients and community. We are prepared to provide the exceptional COVID-19 care and coordination our community needs, expects and deserves.   Q: Who's in charge of this issue at [facility]?  The leadership structure and process established to address COVID-19 includes Chief Physician Executive Bruce Swords, MD; Infection Prevention Medical Director Cynthia Snider, MD; and Infection Prevention Interim Director Sara Wall, MSN, RN, CIC, CSPDT. A team   of [facility] experts reflecting a broad spectrum of our workforce is meeting daily to evaluate new information we receive about COVID-19 and to adapt policies and practices accordingly.                         Published March 01, 2019    

## 2019-03-19 NOTE — Progress Notes (Signed)
ROB- doing well, dicussed covid-19 & pregnancy, will do glucola @ next visit as lab tech out today. 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 Lives with son and spouse, declined 17-OHP.anatomy scan next visit.

## 2019-03-29 ENCOUNTER — Other Ambulatory Visit: Payer: Medicaid Other

## 2019-04-17 ENCOUNTER — Telehealth: Payer: Self-pay

## 2019-04-17 NOTE — Telephone Encounter (Signed)
Coronavirus (COVID-19) Are you at risk?  Are you at risk for the Coronavirus (COVID-19)?  To be considered HIGH RISK for Coronavirus (COVID-19), you have to meet the following criteria:  . Traveled to [place], [place], [place], [place] or [place]; or in the [place] to [place], [place], [place], or [place]; and have fever, cough, and shortness of breath within the last 2 weeks of travel OR . Been in close contact with a person diagnosed with COVID-19 within the last 2 weeks and have fever, cough, and shortness of breath . IF YOU DO NOT MEET THESE CRITERIA, YOU ARE CONSIDERED LOW RISK FOR COVID-19.  What to do if you are HIGH RISK for COVID-19?  . If you are having a medical emergency, call 911. . Seek medical care right away. Before you go to a doctor's office, urgent care or emergency department, call ahead and tell them about your recent travel, contact with someone diagnosed with COVID-19, and your symptoms. You should receive instructions from your physician's office regarding next steps of care.  . When you arrive at healthcare provider, tell the healthcare staff immediately you have returned from visiting [place], [place], [place], [place] or [place]; or traveled in the [place] to [place], [place], [place], or [place]; in the last two weeks or you have been in close contact with a person diagnosed with COVID-19 in the last 2 weeks.   . Tell the health care staff about your symptoms: fever, cough and shortness of breath. . After you have been seen by a medical provider, you will be either: o Tested for (COVID-19) and discharged home on quarantine except to seek medical care if symptoms worsen, and asked to  - Stay home and avoid contact with others until you get your results (4-5 days)  - Avoid travel on public transportation if possible (such as bus, train, or airplane) or o Sent to the Emergency Department by EMS for evaluation, COVID-19 testing, and possible  admission depending on your condition and test results.  What to do if you are LOW RISK for COVID-19?  Reduce your risk of any infection by using the same precautions used for avoiding the common cold or flu:  . Wash your hands often with soap and warm water for at least 20 seconds.  If soap and water are not readily available, use an alcohol-based hand sanitizer with at least 60% alcohol.  . If coughing or sneezing, cover your mouth and nose by coughing or sneezing into the elbow areas of your shirt or coat, into a tissue or into your sleeve (not your hands). . Avoid shaking hands with others and consider head nods or verbal greetings only. . Avoid touching your eyes, nose, or mouth with unwashed hands.  . Avoid close contact with people who are sick. . Avoid places or events with large numbers of people in one location, like concerts or sporting events. . Carefully consider travel plans you have or are making. . If you are planning any travel outside or inside the [place], visit the CDC's Travelers' Health webpage for the latest health notices. . If you have some symptoms but not all symptoms, continue to monitor at home and seek medical attention if your symptoms worsen. . If you are having a medical emergency, call 911.   ADDITIONAL HEALTHCARE OPTIONS FOR PATIENTS  [facility] / e-Visit: [email or web address]         [facility]: [phone]  [facility]: [phone]                   [facility]: [phone]   Pre-screen negative, DM.   

## 2019-04-18 ENCOUNTER — Encounter: Payer: Self-pay | Admitting: Obstetrics and Gynecology

## 2019-04-18 ENCOUNTER — Other Ambulatory Visit: Payer: Self-pay

## 2019-04-18 ENCOUNTER — Ambulatory Visit (INDEPENDENT_AMBULATORY_CARE_PROVIDER_SITE_OTHER): Payer: Medicaid Other | Admitting: Obstetrics and Gynecology

## 2019-04-18 VITALS — BP 116/86 | HR 98 | Wt 202.4 lb

## 2019-04-18 DIAGNOSIS — F419 Anxiety disorder, unspecified: Secondary | ICD-10-CM

## 2019-04-18 DIAGNOSIS — F32A Depression, unspecified: Secondary | ICD-10-CM

## 2019-04-18 DIAGNOSIS — O99342 Other mental disorders complicating pregnancy, second trimester: Secondary | ICD-10-CM

## 2019-04-18 DIAGNOSIS — Z3492 Encounter for supervision of normal pregnancy, unspecified, second trimester: Secondary | ICD-10-CM

## 2019-04-18 DIAGNOSIS — F329 Major depressive disorder, single episode, unspecified: Secondary | ICD-10-CM

## 2019-04-18 LAB — POCT URINALYSIS DIPSTICK OB
Bilirubin, UA: NEGATIVE
Blood, UA: NEGATIVE
Glucose, UA: NEGATIVE
Ketones, UA: NEGATIVE
Nitrite, UA: NEGATIVE
Spec Grav, UA: 1.01 (ref 1.010–1.025)
Urobilinogen, UA: 0.2 E.U./dL
pH, UA: 7.5 (ref 5.0–8.0)

## 2019-04-18 MED ORDER — CITALOPRAM HYDROBROMIDE 20 MG PO TABS: 20.0000 mg | ORAL_TABLET | Freq: Every day | ORAL | 12 refills | Status: DC

## 2019-04-18 NOTE — Progress Notes (Signed)
Patient here to discuss starting medication to help with anxiety and depression. Previously on Citalopram, stopped taking 06/2018.

## 2019-04-18 NOTE — Progress Notes (Signed)
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  Reports increased stressors with school (got behind in work) and two deaths in the family, and spouse.  Reports fatigue, procrastination, can't sleep, decreasing ADLs, appetite wavering daily, increased agitation/tearfulness.  States spouse not very supportive. States was on citalopram (took about a month or more) before pregnancy, and also took zoloft in the past (but the zoloft made her feel like a zombie).   Is open to counselor (gave phone number for Kathreen Cosier). Restarted citalopram and will do close interval follow up. Counseled on SSRI use in pregnancy.  Depression screen PHQ 2/9 04/18/2019  Decreased Interest 2  Down, Depressed, Hopeless 2  PHQ - 2 Score 4  Altered sleeping 3  Tired, decreased energy 3  Change in appetite 1  Feeling bad or failure about yourself  2  Trouble concentrating 3  Moving slowly or fidgety/restless 1  Suicidal thoughts 1  PHQ-9 Score 18  Difficult doing work/chores Very difficult   GAD 7 : Generalized Anxiety Score 04/18/2019  Nervous, Anxious, on Edge 3  Control/stop worrying 3  Worry too much - different things 3  Trouble relaxing 3  Restless 1  Easily annoyed or irritable 3  Afraid - awful might happen 1  Total GAD 7 Score 17  Anxiety Difficulty Somewhat difficult   Psychiatric Specialty Exam: Physical Exam  ROS  Blood pressure 116/86, pulse 98, weight 202 lb 6.4 oz (91.8 kg), last menstrual period 12/04/2018, unknown if currently breastfeeding.Body mass index is 32.67 kg/m.  General Appearance: Fairly Groomed  Eye Contact:  Good  Speech:  Clear and Coherent and Normal Rate  Volume:  Decreased  Mood:  Worthless and depressed  Affect:  Appropriate  Thought Process:  Disorganized  Orientation:  Full (Time, Place, and Person)  Thought Content:  Logical  Suicidal Thoughts:  No  Homicidal Thoughts:  No  Memory:  Immediate;   Good Recent;   Good Remote;   Good  Judgement:  Fair  Insight:  Fair   Psychomotor Activity:  Normal  Concentration:  Concentration: Good and Attention Span: Good  Recall:  Good  Fund of Knowledge:  Good  Language:  Good  Akathisia:  Negative  Handed:  Right  AIMS (if indicated):     Assets:  Communication Skills Desire for Improvement Financial Resources/Insurance Resilience Transportation Vocational/Educational  ADL's:  Impaired  Cognition:  WNL  Sleep:

## 2019-04-18 NOTE — Patient Instructions (Addendum)
Panic Attack A panic attack is a sudden episode of severe anxiety, fear, or discomfort that causes physical and emotional symptoms. The attack may be in response to something frightening, or it may occur for no known reason. Symptoms of a panic attack can be similar to symptoms of a heart attack or stroke. It is important to see your health care provider when you have a panic attack so that these conditions can be ruled out. A panic attack is a symptom of another condition. Most panic attacks go away with treatment of the underlying problem. If you have panic attacks often, you may have a condition called panic disorder. What are the causes? A panic attack may be caused by:  An extreme, life-threatening situation, such as a war or natural disaster.  An anxiety disorder, such as post-traumatic stress disorder.  Depression.  Certain medical conditions, including heart problems, neurological conditions, and infections.  Certain over-the-counter and prescription medicines.  Illegal drugs that increase heart rate and blood pressure, such as methamphetamine.  Alcohol.  Supplements that increase anxiety.  Panic disorder. What increases the risk? You are more likely to develop this condition if:  You have an anxiety disorder.  You have another mental health condition.  You take certain medicines.  You use alcohol, illegal drugs, or other substances.  You are under extreme stress.  A life event is causing increased feelings of anxiety and depression. What are the signs or symptoms? A panic attack starts suddenly, usually lasts about 20 minutes, and occurs with one or more of the following:  A pounding heart.  A feeling that your heart is beating irregularly or faster than normal (palpitations).  Sweating.  Trembling or shaking.  Shortness of breath or feeling smothered.  Feeling choked.  Chest pain or discomfort.  Nausea or a strange feeling in your  stomach.  Dizziness, feeling lightheaded, or feeling like you might faint.  Chills or hot flashes.  Numbness or tingling in your lips, hands, or feet.  Feeling confused, or feeling that you are not yourself.  Fear of losing control or being emotionally unstable.  Fear of dying. How is this diagnosed? A panic attack is diagnosed with an assessment by your health care provider. During the assessment your health care provider will ask questions about:  Your history of anxiety, depression, and panic attacks.  Your medical history.  Whether you drink alcohol, use illegal drugs, take supplements, or take medicines. Be honest about your substance use. Your health care provider may also:  Order blood tests or other kinds of tests to rule out serious medical conditions.  Refer you to a mental health professional for further evaluation. How is this treated? Treatment depends on the cause of the panic attack:  If the cause is a medical problem, your health care provider will either treat that problem or refer you to a specialist.  If the cause is emotional, you may be given anti-anxiety medicines or referred to a counselor. These medicines may reduce how often attacks happen, reduce how severe the attacks are, and lower anxiety.  If the cause is a medicine, your health care provider may tell you to stop the medicine, change your dose, or take a different medicine.  If the cause is a drug, treatment may involve letting the drug wear off and taking medicine to help the drug leave your body or to counteract its effects. Attacks caused by drug abuse may continue even if you stop using the drug. Follow these instructions   at home:  Take over-the-counter and prescription medicines only as told by your health care provider.  If you feel anxious, limit your caffeine intake.  Take good care of your physical and mental health by: ? Eating a balanced diet that includes plenty of fresh fruits and  vegetables, whole grains, lean meats, and low-fat dairy. ? Getting plenty of rest. Try to get 7-8 hours of uninterrupted sleep each night. ? Exercising regularly. Try to get 30 minutes of physical activity at least 5 days a week. ? Not smoking. Talk to your health care provider if you need help quitting. ? Limiting alcohol intake to no more than 1 drink a day for nonpregnant women and 2 drinks a day for men. One drink equals 12 oz of beer, 5 oz of wine, or 1 oz of hard liquor.  Keep all follow-up visits as told by your health care provider. This is important. Panic attacks may have underlying physical or emotional problems that take time to accurately diagnose. Contact a health care provider if:  Your symptoms do not improve, or they get worse.  You are not able to take your medicine as prescribed because of side effects. Get help right away if:  You have serious thoughts about hurting yourself or others.  You have symptoms of a panic attack. Do not drive yourself to the hospital. Have someone else drive you or call an ambulance. If you ever feel like you may hurt yourself or others, or you have thoughts about taking your own life, get help right away. You can go to your nearest emergency department or call:  Your local emergency services (911 in the [place]  A suicide crisis helpline, such as the National Suicide Prevention Lifeline at 805-172-5360. This is open 24 hours a day. Summary  A panic attack is a sign of a serious health or mental health condition. Get help right away. Do not drive yourself to the hospital. Have someone else drive you or call an ambulance.  Always see a health care provider to have the reasons for the panic attack correctly diagnosed.  If your panic attack was caused by a physical problem, follow your health care provider's suggestions for medicine, referral to a specialist, and lifestyle changes.  If your panic attack was caused by an emotional problem,  follow through with counseling from a qualified mental health specialist.  If you feel like you may hurt yourself or others, call 911 and get help right away. This information is not intended to replace advice given to you by your health care provider. Make sure you discuss any questions you have with your health care provider. Document Released: 11/28/2005 Document Revised: 01/06/2017 Document Reviewed: 01/06/2017 Elsevier Interactive Patient Education  2019 [employer]  Perinatal Depression When a woman feels excessive sadness, anger, or anxiety during pregnancy or during the first 12 months after she gives birth, she has a condition called perinatal depression. Depression can interfere with work, school, relationships, and other everyday activities. If it is not managed properly, it can also cause problems in the mother and her baby. Sometimes, perinatal depression is left untreated because symptoms are thought to be normal mood swings during and right after pregnancy. If you have symptoms of depression, it is important to talk with your health care provider. What are the causes? The exact cause of this condition is not known. Hormonal changes during and after pregnancy may play a role in causing perinatal depression. What increases the risk? You are more  likely to develop this condition if:  You have a personal or family history of depression, anxiety, or mood disorders.  You experience a stressful life event during pregnancy, such as the death of a loved one.  You have a lot of regular life stress.  You do not have support from family members or loved ones, or you are in an abusive relationship. What are the signs or symptoms? Symptoms of this condition include:  Feeling sad or hopeless.  Feelings of guilt.  Feeling irritable or overwhelmed.  Changes in your appetite.  Lack of energy or motivation.  Sleep problems.  Difficulty concentrating or completing tasks.  Loss of  interest in hobbies or relationships.  Headaches or stomach problems that do not go away. How is this diagnosed? This condition is diagnosed based on a physical exam and mental evaluation. In some cases, your health care provider may use a depression screening tool. These tools include a list of questions that can help a health care provider diagnose depression. Your health care provider may refer you to a mental health expert who specializes in depression. How is this treated? This condition may be treated with:  Medicines. Your health care provider will only give you medicines that have been proven safe for pregnancy and breastfeeding.  Talk therapy with a mental health professional to help change your patterns of thinking (cognitive behavioral therapy).  Support groups.  Brain stimulation or light therapies.  Stress reduction therapies, such as mindfulness. Follow these instructions at home: Lifestyle  Do not use any products that contain nicotine or tobacco, such as cigarettes and e-cigarettes. If you need help quitting, ask your health care provider.  Do not use alcohol when you are pregnant. After your baby is born, limit alcohol intake to no more than 1 drink a day. One drink equals 12 oz of beer, 5 oz of wine, or 1 oz of hard liquor.  Consider joining a support group for new mothers. Ask your health care provider for recommendations.  Take good care of yourself. Make sure you: ? Get plenty of sleep. If you are having trouble sleeping, talk with your health care provider. ? Eat a healthy diet. This includes plenty of fruits and vegetables, whole grains, and lean proteins. ? Exercise regularly, as told by your health care provider. Ask your health care provider what exercises are safe for you. General instructions  Take over-the-counter and prescription medicines only as told by your health care provider.  Talk with your partner or family members about your feelings during  pregnancy. Share any concerns or anxieties that you may have.  Ask for help with tasks or chores when you need it. Ask friends and family members to provide meals, watch your children, or help with cleaning.  Keep all follow-up visits as told by your health care provider. This is important. Contact a health care provider if:  You (or people close to you) notice that you have any symptoms of depression.  You have depression and your symptoms get worse.  You experience side effects from medicines, such as nausea or sleep problems. Get help right away if:  You feel like hurting yourself, your baby, or someone else. If you ever feel like you may hurt yourself or others, or have thoughts about taking your own life, get help right away. You can go to your nearest emergency department or call:  Your local emergency services (911 in the [place]  A suicide crisis helpline, such as the [place] Suicide  Prevention Lifeline at 731-517-3943. This is open 24 hours a day. Summary  Perinatal depression is when a woman feels excessive sadness, anger, or anxiety during pregnancy or during the first 12 months after she gives birth.  If perinatal depression is not treated, it can lead to health problems for the mother and her baby.  This condition is treated with medicines, talk therapy, stress reduction therapies, or a combination of two or more treatments.  Talk with your partner or family members about your feelings. Do not be afraid to ask for help. This information is not intended to replace advice given to you by your health care provider. Make sure you discuss any questions you have with your health care provider. Document Released: 01/25/2017 Document Revised: 01/25/2017 Document Reviewed: 01/25/2017 Elsevier Interactive Patient Education  [employer].

## 2019-04-22 ENCOUNTER — Telehealth: Payer: Self-pay | Admitting: *Deleted

## 2019-04-22 NOTE — Telephone Encounter (Signed)
Coronavirus (COVID-19) Are you at risk?  Are you at risk for the Coronavirus (COVID-19)?  To be considered HIGH RISK for Coronavirus (COVID-19), you have to meet the following criteria:  . Traveled to [place], [place], [place], [place] or [place]; or in the [place] to [place], [place], [place], or [place]; and have fever, cough, and shortness of breath within the last 2 weeks of travel OR . Been in close contact with a person diagnosed with COVID-19 within the last 2 weeks and have fever, cough, and shortness of breath . IF YOU DO NOT MEET THESE CRITERIA, YOU ARE CONSIDERED LOW RISK FOR COVID-19.  What to do if you are HIGH RISK for COVID-19?  . If you are having a medical emergency, call 911. . Seek medical care right away. Before you go to a doctor's office, urgent care or emergency department, call ahead and tell them about your recent travel, contact with someone diagnosed with COVID-19, and your symptoms. You should receive instructions from your physician's office regarding next steps of care.  . When you arrive at healthcare provider, tell the healthcare staff immediately you have returned from visiting [place], [place], [place], [place] or [place]; or traveled in the [place] to [place], [place], [place], or [place]; in the last two weeks or you have been in close contact with a person diagnosed with COVID-19 in the last 2 weeks.   . Tell the health care staff about your symptoms: fever, cough and shortness of breath. . After you have been seen by a medical provider, you will be either: o Tested for (COVID-19) and discharged home on quarantine except to seek medical care if symptoms worsen, and asked to  - Stay home and avoid contact with others until you get your results (4-5 days)  - Avoid travel on public transportation if possible (such as bus, train, or airplane) or o Sent to the Emergency Department by EMS for evaluation, COVID-19 testing, and possible  admission depending on your condition and test results.  What to do if you are LOW RISK for COVID-19?  Reduce your risk of any infection by using the same precautions used for avoiding the common cold or flu:  . Wash your hands often with soap and warm water for at least 20 seconds.  If soap and water are not readily available, use an alcohol-based hand sanitizer with at least 60% alcohol.  . If coughing or sneezing, cover your mouth and nose by coughing or sneezing into the elbow areas of your shirt or coat, into a tissue or into your sleeve (not your hands). . Avoid shaking hands with others and consider head nods or verbal greetings only. . Avoid touching your eyes, nose, or mouth with unwashed hands.  . Avoid close contact with people who are sick. . Avoid places or events with large numbers of people in one location, like concerts or sporting events. . Carefully consider travel plans you have or are making. . If you are planning any travel outside or inside the [place], visit the CDC's Travelers' Health webpage for the latest health notices. . If you have some symptoms but not all symptoms, continue to monitor at home and seek medical attention if your symptoms worsen. . If you are having a medical emergency, call 911.   ADDITIONAL HEALTHCARE OPTIONS FOR PATIENTS  [facility] / e-Visit: [email or web address]         [facility]: [phone]  [facility]: [phone]                   [facility]: [phone]   Spoke with pt denies any sx.  Tanice Petre, CMA 

## 2019-04-23 ENCOUNTER — Encounter: Payer: Self-pay | Admitting: Certified Nurse Midwife

## 2019-04-23 ENCOUNTER — Other Ambulatory Visit: Payer: Medicaid Other

## 2019-04-23 ENCOUNTER — Ambulatory Visit (INDEPENDENT_AMBULATORY_CARE_PROVIDER_SITE_OTHER): Payer: Medicaid Other

## 2019-04-23 ENCOUNTER — Ambulatory Visit (INDEPENDENT_AMBULATORY_CARE_PROVIDER_SITE_OTHER): Payer: Medicaid Other | Admitting: Certified Nurse Midwife

## 2019-04-23 ENCOUNTER — Other Ambulatory Visit: Payer: Self-pay

## 2019-04-23 VITALS — BP 124/77 | HR 89 | Wt 202.0 lb

## 2019-04-23 DIAGNOSIS — F419 Anxiety disorder, unspecified: Secondary | ICD-10-CM

## 2019-04-23 DIAGNOSIS — Z3401 Encounter for supervision of normal first pregnancy, first trimester: Secondary | ICD-10-CM

## 2019-04-23 DIAGNOSIS — F32A Depression, unspecified: Secondary | ICD-10-CM

## 2019-04-23 DIAGNOSIS — O09899 Supervision of other high risk pregnancies, unspecified trimester: Secondary | ICD-10-CM | POA: Insufficient documentation

## 2019-04-23 DIAGNOSIS — Z363 Encounter for antenatal screening for malformations: Secondary | ICD-10-CM | POA: Diagnosis not present

## 2019-04-23 DIAGNOSIS — F329 Major depressive disorder, single episode, unspecified: Secondary | ICD-10-CM | POA: Insufficient documentation

## 2019-04-23 DIAGNOSIS — Z3492 Encounter for supervision of normal pregnancy, unspecified, second trimester: Secondary | ICD-10-CM

## 2019-04-23 DIAGNOSIS — O2242 Hemorrhoids in pregnancy, second trimester: Secondary | ICD-10-CM

## 2019-04-23 DIAGNOSIS — O99210 Obesity complicating pregnancy, unspecified trimester: Secondary | ICD-10-CM | POA: Insufficient documentation

## 2019-04-23 DIAGNOSIS — O99342 Other mental disorders complicating pregnancy, second trimester: Secondary | ICD-10-CM

## 2019-04-23 DIAGNOSIS — Z3689 Encounter for other specified antenatal screening: Secondary | ICD-10-CM

## 2019-04-23 DIAGNOSIS — Z283 Underimmunization status: Secondary | ICD-10-CM

## 2019-04-23 DIAGNOSIS — Z2839 Other underimmunization status: Secondary | ICD-10-CM | POA: Insufficient documentation

## 2019-04-23 DIAGNOSIS — Z3A20 20 weeks gestation of pregnancy: Secondary | ICD-10-CM

## 2019-04-23 LAB — POCT URINALYSIS DIPSTICK OB
Bilirubin, UA: NEGATIVE
Blood, UA: NEGATIVE
Glucose, UA: NEGATIVE
Nitrite, UA: NEGATIVE
Spec Grav, UA: 1.02 (ref 1.010–1.025)
Urobilinogen, UA: 0.2 E.U./dL
pH, UA: 6 (ref 5.0–8.0)

## 2019-04-23 MED ORDER — ASPIRIN EC 81 MG PO TBEC: 81.0000 mg | DELAYED_RELEASE_TABLET | Freq: Every day | ORAL | 2 refills | Status: DC

## 2019-04-23 NOTE — Progress Notes (Signed)
ROB-Reports intermittent back pain and rectal discomfort due to hemorrhoids. Discussed home treatment measures. Early glucola today. Started Celexa on Saturday and taking daily, see screening below. Appointment scheduled with Kathreen Cosier on Thursday. Anatomy scan today INCOMPLETE, but normal. Anticipatory guidance regarding course of prenatal care. Reviewed red flag symptoms and when to call. RTC x 4 weeks for follow up anatomy scan and ROB or sooner if needed.   Depression screen [facility] 2/9 04/23/2019 04/18/2019  Decreased Interest 1 2  Down, Depressed, Hopeless 1 2  PHQ - 2 Score 2 4  Altered sleeping 3 3  Tired, decreased energy 3 3  Change in appetite 3 1  Feeling bad or failure about yourself  2 2  Trouble concentrating 2 3  Moving slowly or fidgety/restless 1 1  Suicidal thoughts 0 1  PHQ-9 Score 16 18  Difficult doing work/chores Very difficult Very difficult   GAD 7 : Generalized Anxiety Score 04/23/2019 04/18/2019  Nervous, Anxious, on Edge 3 3  Control/stop worrying 3 3  Worry too much - different things 3 3  Trouble relaxing 3 3  Restless 1 1  Easily annoyed or irritable 1 3  Afraid - awful might happen 1 1  Total GAD 7 Score 15 17  Anxiety Difficulty Somewhat difficult Somewhat difficult   ULTRASOUND REPORT  Location: [facility] Date of Service: 04/23/2019   Indications:Anatomy Ultrasound Findings:  Singleton intrauterine pregnancy is visualized with FHR at 161 BPM. Biometrics give an (U/S) Gestational age of [redacted]w[redacted]d and an (U/S) EDD of 09/09/2019; this correlates with the clinically established Estimated Date of Delivery: 09/10/19  Fetal presentation is Breech.  EFW: 336 g ( 12 oz ). Placenta: anterior. Grade: 1 AFI: subjectively normal.  Anatomic survey is incomplete for Spine; Gender - surprise.    Right Ovary is normal in appearance. Left Ovary is normal appearance. Survey of the adnexa demonstrates no adnexal masses. There is no free peritoneal fluid in the  cul de sac.  Impression: 1. [redacted]w[redacted]d Viable Singleton Intrauterine pregnancy by U/S. 2. (U/S) EDD is consistent with Clinically established Estimated Date of Delivery: 09/10/19 . 3. Anatomic survey is incomplete for Spine.  Recommendations: 1.Clinical correlation with the patient's History and Physical Exam.

## 2019-04-23 NOTE — Progress Notes (Signed)
ROB-Patient c/o intermittent lower back pain "for a while". Also c/o painful hemorrhoids, using wipes and cream with no relief.

## 2019-04-23 NOTE — Patient Instructions (Signed)
Hemorrhoids Hemorrhoids are swollen veins in and around the rectum or anus. There are two types of hemorrhoids:  Internal hemorrhoids. These occur in the veins that are just inside the rectum. They may poke through to the outside and become irritated and painful.  External hemorrhoids. These occur in the veins that are outside the anus and can be felt as a painful swelling or hard lump near the anus. Most hemorrhoids do not cause serious problems, and they can be managed with home treatments such as diet and lifestyle changes. If home treatments do not help the symptoms, procedures can be done to shrink or remove the hemorrhoids. What are the causes? This condition is caused by increased pressure in the anal area. This pressure may result from various things, including:  Constipation.  Straining to have a bowel movement.  Diarrhea.  Pregnancy.  Obesity.  Sitting for long periods of time.  Heavy lifting or other activity that causes you to strain.  Anal sex.  Riding a bike for a long period of time. What are the signs or symptoms? Symptoms of this condition include:  Pain.  Anal itching or irritation.  Rectal bleeding.  Leakage of stool (feces).  Anal swelling.  One or more lumps around the anus. How is this diagnosed? This condition can often be diagnosed through a visual exam. Other exams or tests may also be done, such as:  An exam that involves feeling the rectal area with a gloved hand (digital rectal exam).  An exam of the anal canal that is done using a small tube (anoscope).  A blood test, if you have lost a significant amount of blood.  A test to look inside the colon using a flexible tube with a camera on the end (sigmoidoscopy or colonoscopy). How is this treated? This condition can usually be treated at home. However, various procedures may be done if dietary changes, lifestyle changes, and other home treatments do not help your symptoms. These  procedures can help make the hemorrhoids smaller or remove them completely. Some of these procedures involve surgery, and others do not. Common procedures include:  Rubber band ligation. Rubber bands are placed at the base of the hemorrhoids to cut off their blood supply.  Sclerotherapy. Medicine is injected into the hemorrhoids to shrink them.  Infrared coagulation. A type of light energy is used to get rid of the hemorrhoids.  Hemorrhoidectomy surgery. The hemorrhoids are surgically removed, and the veins that supply them are tied off.  Stapled hemorrhoidopexy surgery. The surgeon staples the base of the hemorrhoid to the rectal wall. Follow these instructions at home: Eating and drinking   Eat foods that have a lot of fiber in them, such as whole grains, beans, nuts, fruits, and vegetables.  Ask your health care provider about taking products that have added fiber (fiber supplements).  Reduce the amount of fat in your diet. You can do this by eating low-fat dairy products, eating less red meat, and avoiding processed foods.  Drink enough fluid to keep your urine pale yellow. Managing pain and swelling   Take warm sitz baths for 20 minutes, 3-4 times a day to ease pain and discomfort. You may do this in a bathtub or using a portable sitz bath that fits over the toilet.  If directed, apply ice to the affected area. Using ice packs between sitz baths may be helpful. ? Put ice in a plastic bag. ? Place a towel between your skin and the bag. ? Leave  the ice on for 20 minutes, 2-3 times a day. General instructions  Take over-the-counter and prescription medicines only as told by your health care provider.  Use medicated creams or suppositories as told.  Get regular exercise. Ask your health care provider how much and what kind of exercise is best for you. In general, you should do moderate exercise for at least 30 minutes on most days of the week (150 minutes each week). This can  include activities such as walking, biking, or yoga.  Go to the bathroom when you have the urge to have a bowel movement. Do not wait.  Avoid straining to have bowel movements.  Keep the anal area dry and clean. Use wet toilet paper or moist towelettes after a bowel movement.  Do not sit on the toilet for long periods of time. This increases blood pooling and pain.  Keep all follow-up visits as told by your health care provider. This is important. Contact a health care provider if you have:  Increasing pain and swelling that are not controlled by treatment or medicine.  Difficulty having a bowel movement, or you are unable to have a bowel movement.  Pain or inflammation outside the area of the hemorrhoids. Get help right away if you have:  Uncontrolled bleeding from your rectum. Summary  Hemorrhoids are swollen veins in and around the rectum or anus.  Most hemorrhoids can be managed with home treatments such as diet and lifestyle changes.  Taking warm sitz baths can help ease pain and discomfort.  In severe cases, procedures or surgery can be done to shrink or remove the hemorrhoids. This information is not intended to replace advice given to you by your health care provider. Make sure you discuss any questions you have with your health care provider. Document Released: 11/25/2000 Document Revised: 04/19/2018 Document Reviewed: 04/19/2018 Elsevier Interactive Patient Education  2019 [place]. Back Pain in Pregnancy Back pain during pregnancy is common. Back pain may be caused by several factors that are related to changes during your pregnancy. Follow these instructions at home: Managing pain, stiffness, and swelling      If directed, for sudden (acute) back pain, put ice on the painful area. ? Put ice in a plastic bag. ? Place a towel between your skin and the bag. ? Leave the ice on for 20 minutes, 2-3 times per day.  If directed, apply heat to the affected area  before you exercise. Use the heat source that your health care provider recommends, such as a moist heat pack or a heating pad. ? Place a towel between your skin and the heat source. ? Leave the heat on for 20-30 minutes. ? Remove the heat if your skin turns bright red. This is especially important if you are unable to feel pain, heat, or cold. You may have a greater risk of getting burned.  If directed, massage the affected area. Activity  Exercise as told by your health care provider. Gentle exercise is the best way to prevent or manage back pain.  Listen to your body when lifting. If lifting hurts, ask for help or bend your knees. This uses your leg muscles instead of your back muscles.  Squat down when picking up something from the floor. Do not bend over.  Only use bed rest for short periods as told by your health care provider. Bed rest should only be used for the most severe episodes of back pain. Standing, sitting, and lying down  Do not stand  in one place for long periods of time.  Use good posture when sitting. Make sure your head rests over your shoulders and is not hanging forward. Use a pillow on your lower back if necessary.  Try sleeping on your side, preferably the left side, with a pregnancy support pillow or 1-2 regular pillows between your legs. ? If you have back pain after a night's rest, your bed may be too soft. ? A firm mattress may provide more support for your back during pregnancy. General instructions  Do not wear high heels.  Eat a healthy diet. Try to gain weight within your health care provider's recommendations.  Use a maternity girdle, elastic sling, or back brace as told by your health care provider.  Take over-the-counter and prescription medicines only as told by your health care provider.  Work with a physical therapist or massage therapist to find ways to manage back pain. Acupuncture or massage therapy may be helpful.  Keep all follow-up  visits as told by your health care provider. This is important. Contact a health care provider if:  Your back pain interferes with your daily activities.  You have increasing pain in other parts of your body. Get help right away if:  You develop numbness, tingling, weakness, or problems with the use of your arms or legs.  You develop severe back pain that is not controlled with medicine.  You have a change in bowel or bladder control.  You develop shortness of breath, dizziness, or you faint.  You develop nausea, vomiting, or sweating.  You have back pain that is a rhythmic, cramping pain similar to labor pains. Labor pain is usually 1-2 minutes apart, lasts for about 1 minute, and involves a bearing down feeling or pressure in your pelvis.  You have back pain and your water breaks or you have vaginal bleeding.  You have back pain or numbness that travels down your leg.  Your back pain developed after you fell.  You develop pain on one side of your back.  You see blood in your urine.  You develop skin blisters in the area of your back pain. Summary  Back pain may be caused by several factors that are related to changes during your pregnancy.  Follow instructions as told by your health care provider for managing pain, stiffness, and swelling.  Exercise as told by your health care provider. Gentle exercise is the best way to prevent or manage back pain.  Take over-the-counter and prescription medicines only as told by your health care provider.  Keep all follow-up visits as told by your health care provider. This is important. This information is not intended to replace advice given to you by your health care provider. Make sure you discuss any questions you have with your health care provider. Document Released: 03/08/2006 Document Revised: 05/16/2018 Document Reviewed: 05/16/2018 Elsevier Interactive Patient Education  2019 [place]. Round Ligament Pain  The round  ligament is a cord of muscle and tissue that helps support the uterus. It can become a source of pain during pregnancy if it becomes stretched or twisted as the baby grows. The pain usually begins in the second trimester (13-28 weeks) of pregnancy, and it can come and go until the baby is delivered. It is not a serious problem, and it does not cause harm to the baby. Round ligament pain is usually a short, sharp, and pinching pain, but it can also be a dull, lingering, and aching pain. The pain is felt  in the lower side of the abdomen or in the groin. It usually starts deep in the groin and moves up to the outside of the hip area. The pain may occur when you:  Suddenly change position, such as quickly going from a sitting to standing position.  Roll over in bed.  Cough or sneeze.  Do physical activity. Follow these instructions at home:   Watch your condition for any changes.  When the pain starts, relax. Then try any of these methods to help with the pain: ? Sitting down. ? Flexing your knees up to your abdomen. ? Lying on your side with one pillow under your abdomen and another pillow between your legs. ? Sitting in a warm bath for 15-20 minutes or until the pain goes away.  Take over-the-counter and prescription medicines only as told by your health care provider.  Move slowly when you sit down or stand up.  Avoid long walks if they cause pain.  Stop or reduce your physical activities if they cause pain.  Keep all follow-up visits as told by your health care provider. This is important. Contact a health care provider if:  Your pain does not go away with treatment.  You feel pain in your back that you did not have before.  Your medicine is not helping. Get help right away if:  You have a fever or chills.  You develop uterine contractions.  You have vaginal bleeding.  You have nausea or vomiting.  You have diarrhea.  You have pain when you urinate. Summary  Round  ligament pain is felt in the lower abdomen or groin. It is usually a short, sharp, and pinching pain. It can also be a dull, lingering, and aching pain.  This pain usually begins in the second trimester (13-28 weeks). It occurs because the uterus is stretching with the growing baby, and it is not harmful to the baby.  You may notice the pain when you suddenly change position, when you cough or sneeze, or during physical activity.  Relaxing, flexing your knees to your abdomen, lying on one side, or taking a warm bath may help to get rid of the pain.  Get help from your health care provider if the pain does not go away or if you have vaginal bleeding, nausea, vomiting, diarrhea, or painful urination. This information is not intended to replace advice given to you by your health care provider. Make sure you discuss any questions you have with your health care provider. Document Released: 09/06/2008 Document Revised: 05/16/2018 Document Reviewed: 05/16/2018 Elsevier Interactive Patient Education  2019 [employer].   Common Medications Safe in Pregnancy  Acne:      Constipation:  Benzoyl Peroxide     Colace  Clindamycin      Dulcolax Suppository  Topica Erythromycin     Fibercon  Salicylic Acid      Metamucil         Miralax AVOID:        Senakot   Accutane    Cough:  Retin-A       Cough Drops  Tetracycline      Phenergan w/ Codeine if Rx  Minocycline      Robitussin (Plain & DM)  Antibiotics:     Crabs/Lice:  Ceclor       RID  Cephalosporins    AVOID:  E-Mycins      Kwell  Keflex  Macrobid/Macrodantin   Diarrhea:  Penicillin      Kao-Pectate  Zithromax  Imodium AD         PUSH FLUIDS AVOID:       Cipro     Fever:  Tetracycline      Tylenol (Regular or Extra  Minocycline       Strength)  Levaquin      Extra Strength-Do not          Exceed 8 tabs/24 hrs Caffeine:        <274m/day (equiv. To 1 cup of coffee or  approx. 3 12 oz sodas)         Gas: Cold/Hayfever:        Gas-X  Benadryl      Mylicon  Claritin       Phazyme  **Claritin-D        Chlor-Trimeton    Headaches:  Dimetapp      ASA-Free Excedrin  Drixoral-Non-Drowsy     Cold Compress  Mucinex (Guaifenasin)     Tylenol (Regular or Extra  Sudafed/Sudafed-12 Hour     Strength)  **Sudafed PE Pseudoephedrine   Tylenol Cold & Sinus     Vicks Vapor Rub  Zyrtec  **AVOID if Problems With Blood Pressure         Heartburn: Avoid lying down for at least 1 hour after meals  Aciphex      Maalox     Rash:  Milk of Magnesia     Benadryl    Mylanta       1% Hydrocortisone Cream  Pepcid  Pepcid Complete   Sleep Aids:  Prevacid      Ambien   Prilosec       Benadryl  Rolaids       Chamomile Tea  Tums (Limit 4/day)     Unisom         Tylenol PM         Warm milk-add vanilla or  Hemorrhoids:       Sugar for taste  Anusol/Anusol H.C.  (RX: Analapram 2.5%)  Sugar Substitutes:  Hydrocortisone OTC     Ok in moderation  Preparation H      Tucks        Vaseline lotion applied to tissue with wiping    Herpes:     Throat:  Acyclovir      Oragel  Famvir  Valtrex     Vaccines:         Flu Shot Leg Cramps:       *Gardasil  Benadryl      Hepatitis A         Hepatitis B Nasal Spray:       Pneumovax  Saline Nasal Spray     Polio Booster         Tetanus Nausea:       Tuberculosis test or PPD  Vitamin B6 25 mg TID   AVOID:    Dramamine      *Gardasil  Emetrol       Live Poliovirus  Ginger Root 250 mg QID    MMR (measles, mumps &  High Complex Carbs @ Bedtime    rebella)  Sea Bands-Accupressure    Varicella (Chickenpox)  Unisom 1/2 tab TID     *No known complications           If received before Pain:         Known pregnancy;   Darvocet       Resume series after  Lortab        Delivery  Percocet  Yeast:   Tramadol      Femstat  Tylenol 3      Gyne-lotrimin  Ultram       Monistat  Vicodin           MISC:         All Sunscreens           Hair Coloring/highlights          Insect  Repellant's          (Including DEET)         Mystic Tans

## 2019-04-24 LAB — GLUCOSE TOLERANCE, 1 HOUR: Glucose, 1Hr PP: 108 mg/dL (ref 65–199)

## 2019-05-02 DIAGNOSIS — F411 Generalized anxiety disorder: Secondary | ICD-10-CM | POA: Insufficient documentation

## 2019-05-02 DIAGNOSIS — F331 Major depressive disorder, recurrent, moderate: Secondary | ICD-10-CM | POA: Insufficient documentation

## 2019-05-07 ENCOUNTER — Telehealth: Payer: Self-pay

## 2019-05-07 NOTE — Telephone Encounter (Signed)
[facility]TRC for screening 

## 2019-05-07 NOTE — Telephone Encounter (Signed)
Coronavirus (COVID-19) Are you at risk?  Are you at risk for the Coronavirus (COVID-19)?  To be considered HIGH RISK for Coronavirus (COVID-19), you have to meet the following criteria:  . Traveled to [place], [place], [place], [place] or [place]; or in the [place] to [place], [place], [place], or [place]; and have fever, cough, and shortness of breath within the last 2 weeks of travel OR . Been in close contact with a person diagnosed with COVID-19 within the last 2 weeks and have fever, cough, and shortness of breath . IF YOU DO NOT MEET THESE CRITERIA, YOU ARE CONSIDERED LOW RISK FOR COVID-19.  What to do if you are HIGH RISK for COVID-19?  . If you are having a medical emergency, call 911. . Seek medical care right away. Before you go to a doctor's office, urgent care or emergency department, call ahead and tell them about your recent travel, contact with someone diagnosed with COVID-19, and your symptoms. You should receive instructions from your physician's office regarding next steps of care.  . When you arrive at healthcare provider, tell the healthcare staff immediately you have returned from visiting [place], [place], [place], [place] or [place]; or traveled in the [place] to [place], [place], [place], or [place]; in the last two weeks or you have been in close contact with a person diagnosed with COVID-19 in the last 2 weeks.   . Tell the health care staff about your symptoms: fever, cough and shortness of breath. . After you have been seen by a medical provider, you will be either: o Tested for (COVID-19) and discharged home on quarantine except to seek medical care if symptoms worsen, and asked to  - Stay home and avoid contact with others until you get your results (4-5 days)  - Avoid travel on public transportation if possible (such as bus, train, or airplane) or o Sent to the Emergency Department by EMS for evaluation, COVID-19 testing, and possible  admission depending on your condition and test results.  What to do if you are LOW RISK for COVID-19?  Reduce your risk of any infection by using the same precautions used for avoiding the common cold or flu:  . Wash your hands often with soap and warm water for at least 20 seconds.  If soap and water are not readily available, use an alcohol-based hand sanitizer with at least 60% alcohol.  . If coughing or sneezing, cover your mouth and nose by coughing or sneezing into the elbow areas of your shirt or coat, into a tissue or into your sleeve (not your hands). . Avoid shaking hands with others and consider head nods or verbal greetings only. . Avoid touching your eyes, nose, or mouth with unwashed hands.  . Avoid close contact with people who are Brenda Fuentes. . Avoid places or events with large numbers of people in one location, like concerts or sporting events. . Carefully consider travel plans you have or are making. . If you are planning any travel outside or inside the [place], visit the CDC's Travelers' Health webpage for the latest health notices. . If you have some symptoms but not all symptoms, continue to monitor at home and seek medical attention if your symptoms worsen. . If you are having a medical emergency, call 911.  05/07/19 SCREENING NEG [facility] ADDITIONAL HEALTHCARE OPTIONS FOR PATIENTS  [facility] / e-Visit: [email or web address]         [facility]: [phone]    [facility]: [phone]                   [facility]: [phone]  

## 2019-05-08 ENCOUNTER — Encounter: Payer: Self-pay | Admitting: Certified Nurse Midwife

## 2019-05-08 ENCOUNTER — Ambulatory Visit (INDEPENDENT_AMBULATORY_CARE_PROVIDER_SITE_OTHER): Payer: Medicaid Other | Admitting: Certified Nurse Midwife

## 2019-05-08 ENCOUNTER — Other Ambulatory Visit: Payer: Self-pay

## 2019-05-08 VITALS — BP 125/81 | HR 98 | Wt 202.0 lb

## 2019-05-08 DIAGNOSIS — Z3492 Encounter for supervision of normal pregnancy, unspecified, second trimester: Secondary | ICD-10-CM

## 2019-05-08 LAB — POCT URINALYSIS DIPSTICK OB
Bilirubin, UA: NEGATIVE
Blood, UA: NEGATIVE
Glucose, UA: NEGATIVE
Ketones, UA: NEGATIVE
Nitrite, UA: NEGATIVE
POC,PROTEIN,UA: NEGATIVE
Spec Grav, UA: 1.015 (ref 1.010–1.025)
Urobilinogen, UA: 0.2 E.U./dL
pH, UA: 6 (ref 5.0–8.0)

## 2019-05-08 NOTE — Patient Instructions (Addendum)
Round Ligament Pain  The round ligament is a cord of muscle and tissue that helps support the uterus. It can become a source of pain during pregnancy if it becomes stretched or twisted as the baby grows. The pain usually begins in the second trimester (13-28 weeks) of pregnancy, and it can come and go until the baby is delivered. It is not a serious problem, and it does not cause harm to the baby. Round ligament pain is usually a short, sharp, and pinching pain, but it can also be a dull, lingering, and aching pain. The pain is felt in the lower side of the abdomen or in the groin. It usually starts deep in the groin and moves up to the outside of the hip area. The pain may occur when you:  Suddenly change position, such as quickly going from a sitting to standing position.  Roll over in bed.  Cough or sneeze.  Do physical activity. Follow these instructions at home:   Watch your condition for any changes.  When the pain starts, relax. Then try any of these methods to help with the pain: ? Sitting down. ? Flexing your knees up to your abdomen. ? Lying on your side with one pillow under your abdomen and another pillow between your legs. ? Sitting in a warm bath for 15-20 minutes or until the pain goes away.  Take over-the-counter and prescription medicines only as told by your health care provider.  Move slowly when you sit down or stand up.  Avoid long walks if they cause pain.  Stop or reduce your physical activities if they cause pain.  Keep all follow-up visits as told by your health care provider. This is important. Contact a health care provider if:  Your pain does not go away with treatment.  You feel pain in your back that you did not have before.  Your medicine is not helping. Get help right away if:  You have a fever or chills.  You develop uterine contractions.  You have vaginal bleeding.  You have nausea or vomiting.  You have diarrhea.  You have pain  when you urinate. Summary  Round ligament pain is felt in the lower abdomen or groin. It is usually a short, sharp, and pinching pain. It can also be a dull, lingering, and aching pain.  This pain usually begins in the second trimester (13-28 weeks). It occurs because the uterus is stretching with the growing baby, and it is not harmful to the baby.  You may notice the pain when you suddenly change position, when you cough or sneeze, or during physical activity.  Relaxing, flexing your knees to your abdomen, lying on one side, or taking a warm bath may help to get rid of the pain.  Get help from your health care provider if the pain does not go away or if you have vaginal bleeding, nausea, vomiting, diarrhea, or painful urination. This information is not intended to replace advice given to you by your health care provider. Make sure you discuss any questions you have with your health care provider. Document Released: 09/06/2008 Document Revised: 05/16/2018 Document Reviewed: 05/16/2018 Elsevier Interactive Patient Education  2019 [employer]  Hypokalemia Hypokalemia means that the amount of potassium in the blood is lower than normal.Potassium is a chemical that helps regulate the amount of fluid in the body (electrolyte). It also stimulates muscle tightening (contraction) and helps nerves work properly.Normally, most of the body's potassium is inside of cells, and only  a very small amount is in the blood. Because the amount in the blood is so small, minor changes to potassium levels in the blood can be life-threatening. What are the causes? This condition may be caused by:  Antibiotic medicine.  Diarrhea or vomiting. Taking too much of a medicine that helps you have a bowel movement (laxative) can cause diarrhea and lead to hypokalemia.  Chronic kidney disease (CKD).  Medicines that help the body get rid of excess fluid (diuretics).  Eating disorders, such as bulimia.  Low  magnesium levels in the body.  Sweating a lot. What are the signs or symptoms? Symptoms of this condition include:  Weakness.  Constipation.  Fatigue.  Muscle cramps.  Mental confusion.  Skipped heartbeats or irregular heartbeat (palpitations).  Tingling or numbness. How is this diagnosed? This condition is diagnosed with a blood test. How is this treated? Hypokalemia can be treated by taking potassium supplements by mouth or adjusting the medicines that you take. Treatment may also include eating more foods that contain a lot of potassium. If your potassium level is very low, you may need to get potassium through an IV tube in one of your veins and be monitored in the hospital. Follow these instructions at home:   Take over-the-counter and prescription medicines only as told by your health care provider. This includes vitamins and supplements.  Eat a healthy diet. A healthy diet includes fresh fruits and vegetables, whole grains, healthy fats, and lean proteins.  If instructed, eat more foods that contain a lot of potassium, such as: ? Nuts, such as peanuts and pistachios. ? Seeds, such as sunflower seeds and pumpkin seeds. ? Peas, lentils, and lima beans. ? Whole grain and bran cereals and breads. ? Fresh fruits and vegetables, such as apricots, avocado, bananas, cantaloupe, kiwi, oranges, tomatoes, asparagus, and potatoes. ? Orange juice. ? Tomato juice. ? Red meats. ? Yogurt.  Keep all follow-up visits as told by your health care provider. This is important. Contact a health care provider if:  You have weakness that gets worse.  You feel your heart pounding or racing.  You vomit.  You have diarrhea.  You have diabetes (diabetes mellitus) and you have trouble keeping your blood sugar (glucose) in your target range. Get help right away if:  You have chest pain.  You have shortness of breath.  You have vomiting or diarrhea that lasts for more than 2 days.   You faint. This information is not intended to replace advice given to you by your health care provider. Make sure you discuss any questions you have with your health care provider. Document Released: 11/28/2005 Document Revised: 07/16/2016 Document Reviewed: 07/16/2016 Elsevier Interactive Patient Education  2019 [employer].

## 2019-05-08 NOTE — Progress Notes (Signed)
Patient here to follow-up on 5/23 ER visit, c/o still having numbness in arms and legs intermittently.

## 2019-05-08 NOTE — Progress Notes (Signed)
PT presents today for follow up after ED visit in [place]  For numbness and tingling in hands and legs. Reviewed Carpal Tunnel syndrome and sciatic back pain in pregnancy . Reassurance given. Discussed esercises for back and hands. Handouts given. Discussed referral to chiropractor if needed. Pt states that they told her her calcium and potassium were low. She was given an IV at the hospital . Not sure if it was with potassium or not. CMP today. Will follow up with result. Return as scheduled with me in 2 wks.   Doreene Burke, CNM

## 2019-05-09 LAB — COMPREHENSIVE METABOLIC PANEL
ALT: 9 IU/L (ref 0–32)
AST: 8 IU/L (ref 0–40)
Albumin/Globulin Ratio: 1.5 (ref 1.2–2.2)
Albumin: 3.6 g/dL — ABNORMAL LOW (ref 3.9–5.0)
Alkaline Phosphatase: 73 IU/L (ref 39–117)
BUN/Creatinine Ratio: 15 (ref 9–23)
BUN: 5 mg/dL — ABNORMAL LOW (ref 6–20)
Bilirubin Total: <0.2 mg/dL (ref 0.0–1.2)
CO2: 19 mmol/L — ABNORMAL LOW (ref 20–29)
Calcium: 9 mg/dL (ref 8.7–10.2)
Chloride: 107 mmol/L — ABNORMAL HIGH (ref 96–106)
Creatinine, Ser: 0.34 mg/dL — ABNORMAL LOW (ref 0.57–1.00)
GFR calc Af Amer: 182 mL/min/{1.73_m2} (ref >59)
GFR calc non Af Amer: 158 mL/min/{1.73_m2} (ref >59)
Globulin, Total: 2.4 g/dL (ref 1.5–4.5)
Glucose: 81 mg/dL (ref 65–99)
Potassium: 4.2 mmol/L (ref 3.5–5.2)
Sodium: 142 mmol/L (ref 134–144)
Total Protein: 6 g/dL (ref 6.0–8.5)

## 2019-05-21 ENCOUNTER — Other Ambulatory Visit: Payer: Self-pay

## 2019-05-21 ENCOUNTER — Encounter: Payer: Self-pay | Admitting: Certified Nurse Midwife

## 2019-05-21 ENCOUNTER — Ambulatory Visit (INDEPENDENT_AMBULATORY_CARE_PROVIDER_SITE_OTHER): Payer: Medicaid Other | Admitting: Certified Nurse Midwife

## 2019-05-21 ENCOUNTER — Ambulatory Visit (INDEPENDENT_AMBULATORY_CARE_PROVIDER_SITE_OTHER): Payer: Medicaid Other

## 2019-05-21 VITALS — BP 117/68 | HR 83 | Wt 203.1 lb

## 2019-05-21 DIAGNOSIS — Z362 Encounter for other antenatal screening follow-up: Secondary | ICD-10-CM | POA: Diagnosis not present

## 2019-05-21 DIAGNOSIS — Z3480 Encounter for supervision of other normal pregnancy, unspecified trimester: Secondary | ICD-10-CM

## 2019-05-21 DIAGNOSIS — Z3689 Encounter for other specified antenatal screening: Secondary | ICD-10-CM

## 2019-05-21 DIAGNOSIS — Z3492 Encounter for supervision of normal pregnancy, unspecified, second trimester: Secondary | ICD-10-CM

## 2019-05-21 NOTE — Patient Instructions (Signed)

## 2019-05-21 NOTE — Progress Notes (Signed)
ROB and u/s for completion of anatomy scan ( see below) results reviewed. PHq9 16, increased from last visit. Reviwed with pt. Pt is seeing a consoler. . Discussed increasing medication dose. She would like to re evaluate at next visit. Discussed exercise 3 x wk to improve mood and eating healthy diet. She verbalizes and agrees to plan. Follow up 4 wks or sooner if needed. Glucose screen next visit -reviewed. Follow up 4 wks with Melody .   Philip Aspen, CNM   Patient Name: Brenda Fuentes DOB: [birth year] MRN: [id number] ULTRASOUND REPORT  Location: [facility] Date of Service: 05/21/2019   Indications: Anatomy follow up ultrasound Findings:  Nelda Marseille intrauterine pregnancy is visualized with FHR at 143 BPM.  Fetal presentation is Cephalic.  Placenta: anterior. Grade: 1 AFI: subjectively normal.  Anatomic survey is complete.   There is no free peritoneal fluid in the cul de sac.  Impression: 1. [redacted]w[redacted]d Viable Singleton Intrauterine pregnancy previously established criteria. 2. Normal Anatomy Scan is now complete  Recommendations: 1.Clinical correlation with the patient's History and Physical Exam.   Jenine M. Albertine Grates    RDMS

## 2019-06-06 DIAGNOSIS — F411 Generalized anxiety disorder: Secondary | ICD-10-CM

## 2019-06-06 DIAGNOSIS — F331 Major depressive disorder, recurrent, moderate: Secondary | ICD-10-CM

## 2019-06-10 ENCOUNTER — Ambulatory Visit: Payer: Medicaid Other | Admitting: Licensed Clinical Social Worker

## 2019-06-10 ENCOUNTER — Other Ambulatory Visit: Payer: Self-pay

## 2019-06-10 DIAGNOSIS — F331 Major depressive disorder, recurrent, moderate: Secondary | ICD-10-CM

## 2019-06-10 DIAGNOSIS — F411 Generalized anxiety disorder: Secondary | ICD-10-CM

## 2019-06-10 NOTE — Progress Notes (Signed)
Counselor/Therapist Progress Note  Patient ID: Brenda Fuentes, MRN: [id number],    Date: 06/10/2019  Time Spent: 45 minutes   Treatment Type: Psychotherapy  Reported Symptoms: depressed mood, anxiousness   Mental Status Exam:  Appearance:   NA     Behavior:  Appropriate  Motor:  NA  Speech/Language:   Normal Rate  Affect:  Depressed  Mood:  anxious and depressed  Thought process:  normal  Thought content:    WNL  Sensory/Perceptual disturbances:    WNL  Orientation:  oriented to person, place and time/date  Attention:  Good  Concentration:  Good  Memory:  WNL  Fund of knowledge:   Good  Insight:    Good  Judgment:   Good  Impulse Control:  Good   Risk Assessment: Danger to Self:  No Self-injurious Behavior: No Danger to Others: No Duty to Warn:no Physical Aggression / Violence:No  Access to Firearms a concern: No  Gang Involvement:No   Subjective: Patient was engaged and cooperative throughout the session using time to process stressors. She continues to voice depressive symptoms and anxiety. Patient reports that sessions are beneficial and remains motivated to decrease symptoms and improve functioning.    Interventions: Cognitive Behavioral Therapy, Assertiveness/Communication and Mindfulness Meditation. Engaged patient in processing current psychosocial stressors, validating patient's feelings. Reviewed previous session regarding the need for self-care while living within values. Using MI discussed assertiveness communication rolling with resistance. Reviewed mindfulness breathing exercises and encouraged patient to download [facility] - phone mindfulness app. Discussed medication compliance. Provided support through active listening, validation of feelings, and highlighted patient's strengths.     Diagnosis:   ICD-10-CM   1. Generalized anxiety disorder  F41.1   2. Major depressive disorder, recurrent episode, moderate (HCC)  F33.1     Plan: Patient to continue  current cadence of every 2 weeks. Continue CBTs.   Milton Ferguson, LCSW

## 2019-06-20 ENCOUNTER — Other Ambulatory Visit: Payer: Self-pay

## 2019-06-20 ENCOUNTER — Other Ambulatory Visit: Payer: Medicaid Other

## 2019-06-20 ENCOUNTER — Ambulatory Visit (INDEPENDENT_AMBULATORY_CARE_PROVIDER_SITE_OTHER): Payer: Medicaid Other | Admitting: Obstetrics and Gynecology

## 2019-06-20 VITALS — BP 115/77 | HR 95 | Wt 205.2 lb

## 2019-06-20 DIAGNOSIS — O9989 Other specified diseases and conditions complicating pregnancy, childbirth and the puerperium: Secondary | ICD-10-CM

## 2019-06-20 DIAGNOSIS — M25551 Pain in right hip: Secondary | ICD-10-CM

## 2019-06-20 DIAGNOSIS — Z3480 Encounter for supervision of other normal pregnancy, unspecified trimester: Secondary | ICD-10-CM

## 2019-06-20 DIAGNOSIS — Z23 Encounter for immunization: Secondary | ICD-10-CM

## 2019-06-20 LAB — POCT URINALYSIS DIPSTICK OB
Bilirubin, UA: NEGATIVE
Blood, UA: NEGATIVE
Glucose, UA: NEGATIVE
Ketones, UA: NEGATIVE
Leukocytes, UA: NEGATIVE
Nitrite, UA: NEGATIVE
POC,PROTEIN,UA: NEGATIVE
Spec Grav, UA: 1.025 (ref 1.010–1.025)
Urobilinogen, UA: 0.2 E.U./dL
pH, UA: 7 (ref 5.0–8.0)

## 2019-06-20 MED ORDER — TETANUS-DIPHTH-ACELL PERTUSSIS 5-2.5-18.5 LF-MCG/0.5 IM SUSP
0.5000 mL | Freq: Once | INTRAMUSCULAR | Status: AC
  Administered 2019-06-20: 0.5 mL via INTRAMUSCULAR
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
## 2019-06-21 LAB — CBC
Hematocrit: 33.4 % — ABNORMAL LOW (ref 34.0–46.6)
Hemoglobin: 11 g/dL — ABNORMAL LOW (ref 11.1–15.9)
MCH: 26.7 pg (ref 26.6–33.0)
MCHC: 32.9 g/dL (ref 31.5–35.7)
MCV: 81 fL (ref 79–97)
Platelets: 259 10*3/uL (ref 150–450)
RBC: 4.12 x10E6/uL (ref 3.77–5.28)
RDW: 13.8 % (ref 11.7–15.4)
WBC: 9.4 10*3/uL (ref 3.4–10.8)

## 2019-06-21 LAB — GLUCOSE, 1 HOUR GESTATIONAL: Gestational Diabetes Screen: 157 mg/dL — ABNORMAL HIGH (ref 65–139)

## 2019-06-21 LAB — RPR: RPR Ser Ql: NONREACTIVE

## 2019-06-24 ENCOUNTER — Ambulatory Visit: Payer: Medicaid Other | Admitting: Licensed Clinical Social Worker

## 2019-06-24 ENCOUNTER — Other Ambulatory Visit: Payer: Self-pay

## 2019-06-24 ENCOUNTER — Other Ambulatory Visit: Payer: Medicaid Other

## 2019-06-24 DIAGNOSIS — Z3480 Encounter for supervision of other normal pregnancy, unspecified trimester: Secondary | ICD-10-CM

## 2019-06-25 LAB — GESTATIONAL GLUCOSE TOLERANCE
Glucose, Fasting: 85 mg/dL (ref 65–94)
Glucose, GTT - 1 Hour: 147 mg/dL (ref 65–179)
Glucose, GTT - 2 Hour: 113 mg/dL (ref 65–154)
Glucose, GTT - 3 Hour: 118 mg/dL (ref 65–139)

## 2019-06-27 ENCOUNTER — Other Ambulatory Visit: Payer: Self-pay

## 2019-06-27 ENCOUNTER — Ambulatory Visit: Payer: Medicaid Other | Attending: Obstetrics and Gynecology

## 2019-06-27 DIAGNOSIS — M62838 Other muscle spasm: Secondary | ICD-10-CM | POA: Diagnosis not present

## 2019-06-27 DIAGNOSIS — R293 Abnormal posture: Secondary | ICD-10-CM | POA: Diagnosis present

## 2019-06-27 NOTE — Therapy (Addendum)
[facility] [address] [place], [place], [address] Phone: [phone]   Fax:  [phone]  Physical Therapy Evaluation  The patient has been informed of current processes in place at Outpatient Rehab to protect patients from Covid-19 exposure including social distancing, schedule modifications, and new cleaning procedures. After discussing their particular risk with a therapist based on the patient's personal risk factors, the patient has decided to proceed with in-person therapy.   Patient Details  Name: Brenda Fuentes MRN: [id number] Date of Birth: [birth year] Referring Provider (PT): CisneShambley, [place]   Encounter Date: 06/27/2019  PT End of Session - 06/28/19 0918    Visit Number  1    Number of Visits  10    Date for PT Re-Evaluation  09/06/19    Authorization Type  MCA[employer]    Authorization Time Period  07/18/2019    Authorization - Visit Number  1    Authorization - Number of Visits  4    PT Start Time  1035    PT Stop Time  1130    PT Time Calculation (min)  55 min       Past Medical History:  Diagnosis Date  . Anxiety   . Depression 2016  . Gestational hypertension   . Hypercholesteremia 2009    Past Surgical History:  Procedure Laterality Date  . NO PAST SURGERIES      There were no vitals filed for this visit.    Pelvic Floor Physical Therapy Evaluation and Assessment  SCREENING  Falls in last 6 mo: no     Red Flags:  Have you had any night sweats? no Unexplained weight loss? no Saddle anesthesia? no Unexplained changes in bowel or bladder habits? no  SUBJECTIVE  Patient reports: Has had pain for ~ 2 months, nothing seems to make it feel much better, including tylenol. Has tried stretches, not getting relief, if gets worse when walking for long distances, cleaning, laying in bed.  Precautions:  [redacted] weeks pregnant  Social/Family/Vocational History:   Full time mommy  Recent  Procedures/Tests/Findings:  none  Obstetrical History: G2P1, [redacted] weeks pregnant  Gynecological History: no  Urinary History: Frequency ~ 10 min apart. Feels small amounts of leakage.  Gastrointestinal History: Has a BM ~ 7-8 times per day and it is diarrhea.   Sexual activity/pain: Pain with deep thrusting radiating into R hip  Location of pain: R hip Current pain:  6/10  Max pain:  10/10 Least pain:  5/10 Nature of pain: stabbing  Patient Goals: Get relief from pain   OBJECTIVE  Posture/Observations:  Sitting: WNL Standing: L facing pelvis, posterior pelvic tilt/ kyphosis. R PSIS high Supine: L [place] high?  Supine to long sit: L long in sitting, even in lying   (R up-slip)  Palpation/Segmental Motion/Joint Play: TTP through R abdominals, B hip-flexors.  Special tests:   Leg-length: L:87cm, R 86.5   Range of Motion/Flexibilty:  Spine: L SB WNL for motion but tight and painful on R, R SB WNL Hips:   Strength/MMT: Deferred to follow-up LE MMT  LE MMT Left Right  Hip flex:  (L2) /5 /5  Hip ext: /5 /5  Hip abd: /5 /5  Hip add: /5 /5  Hip IR /5 /5  Hip ER /5 /5     Abdominal:  Palpation: TTP through R Hip-flexors and Pectineus. Diastasis:none  Pelvic Floor External Exam: Deferred Until deemed appropriate Introitus Appears:  Skin integrity:  Palpation: Cough: Prolapse  visible?: Scar mobility:  Internal Vaginal Exam: Strength (PERF):  Symmetry: Palpation: Prolapse:   Internal Rectal Exam: Strength (PERF): Symmetry: Palpation: Prolapse:   Gait Analysis: Deferred to follow up   Pelvic Floor Outcome Measures: PDI: 47/60 (78%), PGQ: 55/75 (73%)  INTERVENTIONS THIS SESSION: Self-care: Educated on the structure and function of the pelvic floor in relation to their symptoms as well as the POC, and initial HEP in order to set patient expectations and understanding from which we will build on in the future sessions. Educated on and given  heel-lift to decrease pain and improve pelvic stability and alignment.   Total time: 55 min.      [facility] PT Assessment - 06/28/19 0001      Assessment   Medical Diagnosis  Hip Pain in Pregnancy    Referring Provider (PT)  Aura CampsShambley, Melody    Prior Therapy  none      Precautions   Precautions  --   pregnant     Restrictions   Weight Bearing Restrictions  No      Balance Screen   Has the patient fallen in the past 6 months  No      Home Environment   Living Environment  Private residence    Living Arrangements  Spouse/significant other;Children    Type of Home  Mobile home    Home Access  Stairs to enter    Entrance Stairs-Number of Steps  4    Entrance Stairs-Rails  Right;Left    Home Layout  One level      Prior Function   Level of Independence  Independent      Cognition   Overall Cognitive Status  Within Functional Limits for tasks assessed                Objective measurements completed on examination: See above findings.                PT Short Term Goals - 06/28/19 1702      PT SHORT TERM GOAL #1   Title  Patient will demonstrate improved pelvic alignment and balance of musculature surrounding the pelvis to facilitate decreased PFM spasms and decrease pelvic pain.    Baseline  R up-slip and long LE, spasms surrounding pelvis    Time  5    Period  Weeks    Status  New    Target Date  08/02/19      PT SHORT TERM GOAL #2   Title  Patient will report a reduction in pain to no greater than 5/10 over the prior week to demonstrate symptom improvement.    Baseline  10/10 max    Time  5    Period  Weeks    Status  New    Target Date  08/02/19      PT SHORT TERM GOAL #3   Title  Patient will demonstrate HEP x1 in the clinic to demonstrate understanding and proper form to allow for further improvement.    Baseline  Pt. lacks knowledge of therepeutic exercises that can decrease her pain.    Time  5    Period  Weeks    Status  New     Target Date  08/02/19        PT Long Term Goals - 06/28/19 1711      PT LONG TERM GOAL #1   Title  Patient will report urinating 6-8 times per day over the course of the prior week to demonstrate decreased  frequency.    Baseline  Urinating every ~ 10 min.    Time  10    Period  Weeks    Status  New    Target Date  09/06/19      PT LONG TERM GOAL #2   Title  Patient will report no pain with intercourse to demonstrate improved functional ability.    Baseline  Pain with deep thrusting radiating into R hip    Time  10    Period  Weeks    Status  New    Target Date  09/06/19      PT LONG TERM GOAL #3   Title  Patient will describe pain no greater than 2/10 during ADL's to demonstrate improved functional ability.    Baseline  Pain as high as 10/10    Time  10    Period  Weeks    Status  New    Target Date  09/06/19             Plan - 06/28/19 1646    Clinical Impression Statement  Pt. is a 23 y/o female who is G2P1 and [redacted] weeks pregnant who presents with cheif c/o pelvic pain during pregnancy as well as urinary frequency and pain with intercourse. Her PMH is significant for Anxiety, Depression, and gestational hypertension. Her Clinical exam revealed a R innominate up-slip and longer LE as well as spasms surrounding the pelvis. She will benefit from skilled pelvic PT to address the noted defecits and continue to assess for and address other potential causes of pain and urinary frequency.    Personal Factors and Comorbidities  Comorbidity 3+    Comorbidities  pregnancy, leg-length discrepancy, Anxiety, Depression.    Examination-Activity Limitations  Carry;Lift;Sit;Stand;Locomotion Level;Transfers;Squat;Caring for Others;Stairs    Examination-Participation Restrictions  Interpersonal Relationship;Laundry;Yard Work    [employer]  Evolving/Moderate complexity    Clinical Decision Making  Moderate    Rehab Potential  Good    PT Frequency  1x / week    PT  Duration  --   10 weeks   PT Treatment/Interventions  ADLs/Self Care Home Management;Biofeedback;Gait training;Therapeutic exercise;Therapeutic activities;Functional mobility training;Balance training;Neuromuscular re-education;Patient/family education;Manual techniques;Passive range of motion;Dry needling;Taping;Spinal Manipulations;Joint Manipulations    PT Next Visit Plan  correct for R up-slip ? rotation, give 3-way stretches    PT Home Exercise Plan  heel-lift    Consulted and Agree with Plan of Care  Patient       Patient will benefit from skilled therapeutic intervention in order to improve the following deficits and impairments:  Decreased activity tolerance, Decreased strength, Increased fascial restricitons, Improper body mechanics, Pain, Obesity, Postural dysfunction, Decreased mobility, Impaired flexibility, Increased muscle spasms, Difficulty walking, Decreased coordination  Visit Diagnosis: 1. Other muscle spasm   2. Abnormal posture        Problem List Patient Active Problem List   Diagnosis Date Noted  . Generalized anxiety disorder 05/02/2019  . Major depressive disorder, recurrent episode, moderate ([place]) 05/02/2019  . Maternal varicella, non-immune 04/23/2019  . Anxiety and depression 04/23/2019  . Obesity in pregnancy 04/23/2019  . History of preterm delivery 02/18/2019  . History of gestational hypertension 02/18/2019   Brenda Fuentes DPT, ATC Brenda Fuentes 06/28/2019, 5:18 PM  [place] MAIN [facility] SERVICES [address] [place], [place], [address] Phone: [phone]   Fax:  [phone]  Name: Brenda Fuentes MRN: [id number] Date of Birth: 06/06/1996

## 2019-06-27 NOTE — Patient Instructions (Signed)

## 2019-06-28 NOTE — Addendum Note (Signed)
Addended by: Letitia Libra T on: 06/28/2019 05:21 PM   Modules accepted: Orders

## 2019-07-02 ENCOUNTER — Ambulatory Visit: Payer: Medicaid Other

## 2019-07-02 ENCOUNTER — Other Ambulatory Visit: Payer: Self-pay

## 2019-07-02 ENCOUNTER — Telehealth: Payer: Self-pay | Admitting: Licensed Clinical Social Worker

## 2019-07-02 ENCOUNTER — Ambulatory Visit: Payer: Medicaid Other | Admitting: Licensed Clinical Social Worker

## 2019-07-02 DIAGNOSIS — M62838 Other muscle spasm: Secondary | ICD-10-CM | POA: Diagnosis not present

## 2019-07-02 DIAGNOSIS — R293 Abnormal posture: Secondary | ICD-10-CM

## 2019-07-02 NOTE — Patient Instructions (Signed)
Pelvic Tilt With Pelvic Floor (Hook-Lying)        Lie with hips and knees bent. Squeeze pelvic floor and flatten low back while breathing out so that pelvis tilts. Repeat _3x10__ times. Do _1-2__ times a day.  If you feel light-headed sit up!!!!     Bring both knees up to your chest and then hold the one farthest from the edge of the table/bed and let the other relax toward the floor until stretch is felt through the front of the hip.  Hold for __5__ deep belly breaths. Relax. Repeat __2-3__ times per side.   Do this __1-2__ times per day.

## 2019-07-02 NOTE — Therapy (Signed)
[place] MAIN [facility] SERVICES [address] [place], [place], [address] Phone: [phone]   Fax:  [phone]  Physical Therapy Treatment  The patient has been informed of current processes in place at Outpatient Rehab to protect patients from Covid-19 exposure including social distancing, schedule modifications, and new cleaning procedures. After discussing their particular risk with a therapist based on the patient's personal risk factors, the patient has decided to proceed with in-person therapy.   Patient Details  Name: Brenda Fuentes MRN: [id number] Date of Birth: 25-Jun-1996 Referring Provider (PT): [place], [place]   Encounter Date: 07/02/2019  PT End of Session - 07/02/19 1339    Visit Number  2    Number of Visits  10    Date for PT Re-Evaluation  09/06/19    Authorization Type  MCAID    Authorization Time Period  07/18/2019    Authorization - Visit Number  2    Authorization - Number of Visits  4    PT Start Time  1230    PT Stop Time  1325    PT Time Calculation (min)  55 min    Activity Tolerance  Patient tolerated treatment well;No increased pain       Past Medical History:  Diagnosis Date  . Anxiety   . Depression 2016  . Gestational hypertension   . Hypercholesteremia 2009    Past Surgical History:  Procedure Laterality Date  . NO PAST SURGERIES      There were no vitals filed for this visit.   Pelvic Floor Physical Therapy Treatment Note  SCREENING  Changes in medications, allergies, or medical history?: none    SUBJECTIVE  Patient reports: Pain is greatest with motion, especially when standing from sitting or standing very straight.  Precautions:  [redacted] weeks pregnant  Location of pain: lower abdomen/groin Current pain:  1/10  Max pain:  5/10 Least pain:  0/10 Nature of pain: sharp  Patient Goals: Get relief from pain   OBJECTIVE  Changes in: Posture/Observations:  Hyperlordotic, RLE  long  Range of Motion/Flexibilty:    Strength/MMT:  LE MMT:  Pelvic floor:  Abdominal:   Palpation:  Gait Analysis:  INTERVENTIONS THIS SESSION:   Total time:                              PT Short Term Goals - 06/28/19 1702      PT SHORT TERM GOAL #1   Title  Patient will demonstrate improved pelvic alignment and balance of musculature surrounding the pelvis to facilitate decreased PFM spasms and decrease pelvic pain.    Baseline  R up-slip and long LE, spasms surrounding pelvis    Time  5    Period  Weeks    Status  New    Target Date  08/02/19      PT SHORT TERM GOAL #2   Title  Patient will report a reduction in pain to no greater than 5/10 over the prior week to demonstrate symptom improvement.    Baseline  10/10 max    Time  5    Period  Weeks    Status  New    Target Date  08/02/19      PT SHORT TERM GOAL #3   Title  Patient will demonstrate HEP x1 in the clinic to demonstrate understanding and proper form to allow for further improvement.    Baseline  Pt. lacks  knowledge of therepeutic exercises that can decrease her pain.    Time  5    Period  Weeks    Status  New    Target Date  08/02/19        PT Long Term Goals - 06/28/19 1711      PT LONG TERM GOAL #1   Title  Patient will report urinating 6-8 times per day over the course of the prior week to demonstrate decreased frequency.    Baseline  Urinating every ~ 10 min.    Time  10    Period  Weeks    Status  New    Target Date  09/06/19      PT LONG TERM GOAL #2   Title  Patient will report no pain with intercourse to demonstrate improved functional ability.    Baseline  Pain with deep thrusting radiating into R hip    Time  10    Period  Weeks    Status  New    Target Date  09/06/19      PT LONG TERM GOAL #3   Title  Patient will describe pain no greater than 2/10 during ADL's to demonstrate improved functional ability.    Baseline  Pain as high as 10/10    Time   10    Period  Weeks    Status  New    Target Date  09/06/19      PT LONG TERM GOAL #4   Title  Patient will score at or below 39% on the PGQ and 36% on the [facility] to demonstrate a clinically significant decrease in disability and improved functional ability.    Baseline  PDI: 47/60 (78%), PGQ: 55/75 (73%)    Time  10    Period  Weeks    Status  New    Target Date  09/06/19            Plan - 07/02/19 1340    Clinical Impression Statement  Pt. Responded well to all interventions today, demonstrating improved ability to stand without increased pain and decreased pain and spasm through hip-flexors as well as understanding and correct performance of all education and exercises provided today. They will continue to benefit from skilled physical therapy to work toward remaining goals and maximize function as well as decrease likelihood of symptom increase or recurrence.     Personal Factors and Comorbidities  Comorbidity 3+    Comorbidities  pregnancy, leg-length discrepancy, Anxiety, Depression.    Examination-Activity Limitations  Carry;Lift;Sit;Stand;Locomotion Level;Transfers;Squat;Caring for Others;Stairs    Examination-Participation Restrictions  Interpersonal Relationship;Laundry;Yard Work    Conservation officer, historic buildings  Evolving/Moderate complexity    Rehab Potential  Good    PT Frequency  1x / week    PT Duration  --   10 weeks   PT Treatment/Interventions  ADLs/Self Care Home Management;Biofeedback;Gait training;Therapeutic exercise;Therapeutic activities;Functional mobility training;Balance training;Neuromuscular re-education;Patient/family education;Manual techniques;Passive range of motion;Dry needling;Taping;Spinal Manipulations;Joint Manipulations    PT Next Visit Plan  correct for R up-slip ? rotation, give 3-way stretches    PT Home Exercise Plan  heel-lift    Consulted and Agree with Plan of Care  Patient       Patient will benefit from skilled therapeutic  intervention in order to improve the following deficits and impairments:  Decreased activity tolerance, Decreased strength, Increased fascial restricitons, Improper body mechanics, Pain, Obesity, Postural dysfunction, Decreased mobility, Impaired flexibility, Increased muscle spasms, Difficulty walking, Decreased coordination  Visit Diagnosis: 1. Other  muscle spasm   2. Abnormal posture        Problem List Patient Active Problem List   Diagnosis Date Noted  . Generalized anxiety disorder 05/02/2019  . Major depressive disorder, recurrent episode, moderate (HCC) 05/02/2019  . Maternal varicella, non-immune 04/23/2019  . Anxiety and depression 04/23/2019  . Obesity in pregnancy 04/23/2019  . History of preterm delivery 02/18/2019  . History of gestational hypertension 02/18/2019   Cleophus MoltKeeli T. Shaylin Blatt DPT, ATC Cleophus MoltKeeli T Medea Deines 07/02/2019, 1:41 PM   [facility] [address] [place], [place], [id number] Phone: [phone]   Fax:  [phone]  Name: Brenda Fuentes MRN: [id number] Date of Birth: 07/19/1996

## 2019-07-02 NOTE — Telephone Encounter (Signed)
Attempted to call patient for scheduled phone-based session. LCSW called 4x and each time the call did not connect.

## 2019-07-03 ENCOUNTER — Telehealth: Payer: Self-pay

## 2019-07-03 NOTE — Telephone Encounter (Signed)
Coronavirus (COVID-19) Are you at risk?  Are you at risk for the Coronavirus (COVID-19)?  To be considered HIGH RISK for Coronavirus (COVID-19), you have to meet the following criteria:  . Traveled to [place], [place], [place], [place] or [place]; or in the [place] to [place], [place], [place], or [place]; and have fever, cough, and shortness of breath within the last 2 weeks of travel OR . Been in close contact with a person diagnosed with COVID-19 within the last 2 weeks and have fever, cough, and shortness of breath . IF YOU DO NOT MEET THESE CRITERIA, YOU ARE CONSIDERED LOW RISK FOR COVID-19.  What to do if you are HIGH RISK for COVID-19?  . If you are having a medical emergency, call 911. . Seek medical care right away. Before you go to a doctor's office, urgent care or emergency department, call ahead and tell them about your recent travel, contact with someone diagnosed with COVID-19, and your symptoms. You should receive instructions from your physician's office regarding next steps of care.  . When you arrive at healthcare provider, tell the healthcare staff immediately you have returned from visiting [place], [place], [place], [place] or [place]; or traveled in the [place] to [place], [place], [place], or [place]; in the last two weeks or you have been in close contact with a person diagnosed with COVID-19 in the last 2 weeks.   . Tell the health care staff about your symptoms: fever, cough and shortness of breath. . After you have been seen by a medical provider, you will be either: o Tested for (COVID-19) and discharged home on quarantine except to seek medical care if symptoms worsen, and asked to  - Stay home and avoid contact with others until you get your results (4-5 days)  - Avoid travel on public transportation if possible (such as bus, train, or airplane) or o Sent to the Emergency Department by EMS for evaluation, COVID-19 testing, and possible  admission depending on your condition and test results.  What to do if you are LOW RISK for COVID-19?  Reduce your risk of any infection by using the same precautions used for avoiding the common cold or flu:  . Wash your hands often with soap and warm water for at least 20 seconds.  If soap and water are not readily available, use an alcohol-based hand sanitizer with at least 60% alcohol.  . If coughing or sneezing, cover your mouth and nose by coughing or sneezing into the elbow areas of your shirt or coat, into a tissue or into your sleeve (not your hands). . Avoid shaking hands with others and consider head nods or verbal greetings only. . Avoid touching your eyes, nose, or mouth with unwashed hands.  . Avoid close contact with people who are Annette Bertelson. . Avoid places or events with large numbers of people in one location, like concerts or sporting events. . Carefully consider travel plans you have or are making. . If you are planning any travel outside or inside the [place], visit the CDC's Travelers' Health webpage for the latest health notices. . If you have some symptoms but not all symptoms, continue to monitor at home and seek medical attention if your symptoms worsen. . If you are having a medical emergency, call 911.  07/03/19 SCREENING NEG SLS ADDITIONAL HEALTHCARE OPTIONS FOR PATIENTS   Telehealth / e-Visit: [email or web address]         [facility]: [phone]    [facility]: [phone]                   [facility]: [phone]  

## 2019-07-04 ENCOUNTER — Ambulatory Visit (INDEPENDENT_AMBULATORY_CARE_PROVIDER_SITE_OTHER): Payer: Medicaid Other | Admitting: Certified Nurse Midwife

## 2019-07-04 ENCOUNTER — Other Ambulatory Visit: Payer: Self-pay

## 2019-07-04 VITALS — BP 122/76 | HR 84 | Wt 204.0 lb

## 2019-07-04 DIAGNOSIS — O99343 Other mental disorders complicating pregnancy, third trimester: Secondary | ICD-10-CM

## 2019-07-04 DIAGNOSIS — Z3493 Encounter for supervision of normal pregnancy, unspecified, third trimester: Secondary | ICD-10-CM

## 2019-07-04 DIAGNOSIS — F329 Major depressive disorder, single episode, unspecified: Secondary | ICD-10-CM

## 2019-07-04 DIAGNOSIS — F419 Anxiety disorder, unspecified: Secondary | ICD-10-CM

## 2019-07-04 LAB — POCT URINALYSIS DIPSTICK OB
Bilirubin, UA: NEGATIVE
Blood, UA: NEGATIVE
Glucose, UA: NEGATIVE
Ketones, UA: NEGATIVE
Nitrite, UA: NEGATIVE
POC,PROTEIN,UA: NEGATIVE
Spec Grav, UA: 1.01 (ref 1.010–1.025)
Urobilinogen, UA: 0.2 E.U./dL
pH, UA: 7.5 (ref 5.0–8.0)

## 2019-07-04 NOTE — Progress Notes (Signed)
Brenda Fuentes-Reports doing well, restarted Celexa last week after "taking a break". Encouraged daily Prilosec use for treatment of reflux. Notes relief with PT visits. Spinning Babies and Illustrated Birth Choices handouts provided. Reviewed red flag symptoms and when to call. RTC x 2 weeks for Brenda Fuentes or sooner if needed,   Depression screen [facility] 2/9 07/04/2019 06/20/2019 05/21/2019 05/08/2019 04/23/2019  Decreased Interest 1 2 2 1 1   Down, Depressed, Hopeless 2 1 2 1 1   PHQ - 2 Score 3 3 4 2 2   Altered sleeping 3 3 3 2 3   Tired, decreased energy 2 3 3 3 3   Change in appetite 2 3 3 3 3   Feeling bad or failure about yourself  2 1 2 1 2   Trouble concentrating 3 3 1 2 2   Moving slowly or fidgety/restless 1 1 0 1 1  Suicidal thoughts 1 0 0 0 0  PHQ-9 Score 17 17 16 14 16   Difficult doing work/chores Very difficult Very difficult Very difficult Somewhat difficult Very difficult   GAD 7 : Generalized Anxiety Score 07/04/2019 06/20/2019 05/21/2019 05/08/2019  Nervous, Anxious, on Edge 2 1 2 2   Control/stop worrying 1 3 3 3   Worry too much - different things 2 3 3 3   Trouble relaxing 2 3 3 3   Restless 1 1 2 1   Easily annoyed or irritable 3 3 2 2   Afraid - awful might happen 2 1 2 2   Total GAD 7 Score 13 15 17 16   Anxiety Difficulty Very difficult Somewhat difficult Somewhat difficult Not difficult at all

## 2019-07-04 NOTE — Patient Instructions (Signed)

## 2019-07-04 NOTE — Progress Notes (Signed)
ROB-Patient c/o intermittent pelvic pressure, using belly band and doing PT with some relief.

## 2019-07-10 NOTE — Telephone Encounter (Signed)
Attempted to follow up with patient to offer appointment - left vm encouraging pt. To return call if she desires to reschedule missed appointment.

## 2019-07-11 ENCOUNTER — Ambulatory Visit: Payer: Medicaid Other

## 2019-07-11 ENCOUNTER — Other Ambulatory Visit: Payer: Self-pay

## 2019-07-11 DIAGNOSIS — M62838 Other muscle spasm: Secondary | ICD-10-CM | POA: Diagnosis not present

## 2019-07-11 DIAGNOSIS — R293 Abnormal posture: Secondary | ICD-10-CM

## 2019-07-11 NOTE — Therapy (Signed)
[place] MAIN [facility] SERVICES [address] [place], [place], [address] Phone: [phone]   Fax:  [phone]  Physical Therapy Treatment  The patient has been informed of current processes in place at Outpatient Rehab to protect patients from Covid-19 exposure including social distancing, schedule modifications, and new cleaning procedures. After discussing their particular risk with a therapist based on the patient's personal risk factors, the patient has decided to proceed with in-person therapy.   Patient Details  Name: Brenda Fuentes MRN: [id number] Date of Birth: [birth year] Referring Provider (PT): [place], [place]   Encounter Date: 07/11/2019  PT End of Session - 07/12/19 1200    Visit Number  3    Number of Visits  10    Date for PT Re-Evaluation  09/06/19    Authorization Type  MCAID    Authorization Time Period  07/18/2019    Authorization - Visit Number  3    Authorization - Number of Visits  4    PT Start Time  1030    PT Stop Time  1130    PT Time Calculation (min)  60 min    Activity Tolerance  Patient tolerated treatment well;No increased pain       Past Medical History:  Diagnosis Date  . Anxiety   . Depression 2016  . Gestational hypertension   . Hypercholesteremia 2009    Past Surgical History:  Procedure Laterality Date  . NO PAST SURGERIES      There were no vitals filed for this visit.    Pelvic Floor Physical Therapy Treatment Note  SCREENING  Changes in medications, allergies, or medical history?: none    SUBJECTIVE  Patient reports: She is still having pain in the lower abdomen, not having issues with urination any more, able to fully empty.  Precautions:  [redacted] weeks pregnant  Location of pain: lower abdomen/groin Current pain:  2/10  Max pain:  8/10 Least pain:  0/10 Nature of pain: sharp  * no pain following treatment  Patient Goals: Get relief from pain   OBJECTIVE  Changes  in: Posture/Observations:  Hyperlordotic, RLE long  Range of Motion/Flexibilty:  Decreased sacral mobility on L border  Abdominal:  Weak TA but able to coordinate appropriately with focus/cue for HEP  Palpation: TTP to B lumbar paraspinals and multifidus as well as L QL, Piriformis and Glute min.   INTERVENTIONS THIS SESSION: Manual: Performed TP release through B lumbar paraspinals and multifidus as well as L QL, Piriformis and Glute min. to decrease spasm and pain and allow for improved balance of musculature for improved function and decreased symptoms. Performed grade 3-4 PA mobs to L sacral border to improve mobility of joint and surrounding connective tissue and decrease pressure on nerve roots for improved conductivity and function of down-stream tissues.  Therex: educated on and practiced TA in mod-quad and pelvic tilts in seated to improve strength of muscles opposing tight musculature to allow reciprocal inhibition to improve balance of musculature surrounding the pelvis and improve overall posture for optimal musculature length-tension relationship and function.   Total time: 60 min.                                    PT Short Term Goals - 06/28/19 1702      PT SHORT TERM GOAL #1   Title  Patient will demonstrate improved pelvic alignment and balance of  musculature surrounding the pelvis to facilitate decreased PFM spasms and decrease pelvic pain.    Baseline  R up-slip and long LE, spasms surrounding pelvis    Time  5    Period  Weeks    Status  New    Target Date  08/02/19      PT SHORT TERM GOAL #2   Title  Patient will report a reduction in pain to no greater than 5/10 over the prior week to demonstrate symptom improvement.    Baseline  10/10 max    Time  5    Period  Weeks    Status  New    Target Date  08/02/19      PT SHORT TERM GOAL #3   Title  Patient will demonstrate HEP x1 in the clinic to demonstrate understanding and  proper form to allow for further improvement.    Baseline  Pt. lacks knowledge of therepeutic exercises that can decrease her pain.    Time  5    Period  Weeks    Status  New    Target Date  08/02/19        PT Long Term Goals - 06/28/19 1711      PT LONG TERM GOAL #1   Title  Patient will report urinating 6-8 times per day over the course of the prior week to demonstrate decreased frequency.    Baseline  Urinating every ~ 10 min.    Time  10    Period  Weeks    Status  New    Target Date  09/06/19      PT LONG TERM GOAL #2   Title  Patient will report no pain with intercourse to demonstrate improved functional ability.    Baseline  Pain with deep thrusting radiating into R hip    Time  10    Period  Weeks    Status  New    Target Date  09/06/19      PT LONG TERM GOAL #3   Title  Patient will describe pain no greater than 2/10 during ADL's to demonstrate improved functional ability.    Baseline  Pain as high as 10/10    Time  10    Period  Weeks    Status  New    Target Date  09/06/19      PT LONG TERM GOAL #4   Title  Patient will score at or below 39% on the PGQ and 36% on the [facility] to demonstrate a clinically significant decrease in disability and improved functional ability.    Baseline  PDI: 47/60 (78%), PGQ: 55/75 (73%)    Time  10    Period  Weeks    Status  New    Target Date  09/06/19            Plan - 07/12/19 1201    Clinical Impression Statement  Pt. Responded well to all interventions today, demonstrating improved sacral mobility, decreased spasms and pain, improved TA recruitment, as well as understanding and correct performance of all education and exercises provided today. They will continue to benefit from skilled physical therapy to work toward remaining goals and maximize function as well as decrease likelihood of symptom increase or recurrence.     Personal Factors and Comorbidities  Comorbidity 3+    Comorbidities  pregnancy, leg-length  discrepancy, Anxiety, Depression.    Examination-Activity Limitations  Carry;Lift;Sit;Stand;Locomotion Level;Transfers;Squat;Caring for Others;Stairs    Examination-Participation Restrictions  Interpersonal Relationship;Laundry;[employer]  Work    Stability/Clinical Decision Making  Evolving/Moderate complexity    Rehab Potential  Good    PT Frequency  1x / week    PT Duration  --   10 weeks   PT Treatment/Interventions  ADLs/Self Care Home Management;Biofeedback;Gait training;Therapeutic exercise;Therapeutic activities;Functional mobility training;Balance training;Neuromuscular re-education;Patient/family education;Manual techniques;Passive range of motion;Dry needling;Taping;Spinal Manipulations;Joint Manipulations    PT Next Visit Plan  give 3-way stretch alternatives, assess PFM and treat PRN.    PT Home Exercise Plan  heel-lift, pelvic tilt in seated, TA in mod-quad    Consulted and Agree with Plan of Care  Patient       Patient will benefit from skilled therapeutic intervention in order to improve the following deficits and impairments:  Decreased activity tolerance, Decreased strength, Increased fascial restricitons, Improper body mechanics, Pain, Obesity, Postural dysfunction, Decreased mobility, Impaired flexibility, Increased muscle spasms, Difficulty walking, Decreased coordination  Visit Diagnosis: 1. Other muscle spasm   2. Abnormal posture        Problem List Patient Active Problem List   Diagnosis Date Noted  . Generalized anxiety disorder 05/02/2019  . Major depressive disorder, recurrent episode, moderate (HCC) 05/02/2019  . Maternal varicella, non-immune 04/23/2019  . Anxiety and depression 04/23/2019  . Obesity in pregnancy 04/23/2019  . History of preterm delivery 02/18/2019  . History of gestational hypertension 02/18/2019   Cleophus Molt DPT, ATC Cleophus Molt 07/12/2019, 12:03 PM   [facility] MAIN [facility] SERVICES [address] [place], [place], [address] Phone: [phone]   Fax:  [phone]  Name: KALIANNAH CALVA MRN: [id number] Date of Birth: 1996/12/05

## 2019-07-11 NOTE — Patient Instructions (Signed)
   Do 2 sets of 15 tilts per day. Breathe in when you tilt forward (A) and out when you tuck under (B). 1-2x/day    Breathe in, let belly relax down toward the floor and then breathe out, pulling the lower belly in toward the backbone.   Repeat this _2x10__ times __2-3_ times per day

## 2019-07-16 ENCOUNTER — Ambulatory Visit: Payer: Medicaid Other | Admitting: Licensed Clinical Social Worker

## 2019-07-16 DIAGNOSIS — F411 Generalized anxiety disorder: Secondary | ICD-10-CM

## 2019-07-16 DIAGNOSIS — F331 Major depressive disorder, recurrent, moderate: Secondary | ICD-10-CM

## 2019-07-16 NOTE — Progress Notes (Signed)
Counselor/Therapist Progress Note  Patient ID: Brenda Fuentes, MRN: [id number],    Date: 07/16/2019  Time Spent: 45 minutes    Treatment Type: Psychotherapy  Reported Symptoms: Fatigue, Physical aches and pain and irritability, frustration   Mental Status Exam:  Appearance:   NA     Behavior:  Appropriate  Motor:  NA  Speech/Language:   Normal Rate  Affect:  Appropriate  Mood:  normal  Thought process:  normal  Thought content:    WNL  Sensory/Perceptual disturbances:    WNL  Orientation:  oriented to person, place and time/date  Attention:  Good  Concentration:  Good  Memory:  WNL  Fund of knowledge:   Good  Insight:    Good  Judgment:   Good  Impulse Control:  Good   Risk Assessment: Danger to Self:  No Self-injurious Behavior: No Danger to Others: No Duty to Warn:no Physical Aggression / Violence:No  Access to Firearms a concern: No  Gang Involvement:No   Subjective: Patient was engaged and cooperative throughout the session using time effectively to discuss thoughts, feelings, and coping strategies. Patient voices increased stressors and symptoms since last session. She remains motivated for treatment and voices understanding of mood and anxiety symptoms. Patient is likely to benefit from future treatment because she remains motivated to decrease symptoms and reports benefit of regular sessions in addressing her mood and anxiety symptoms.   Interventions: Cognitive Behavioral Therapy Established psychological safety. Engaged patient in processing current psychosocial stressors- multiple stressors including physical pain. Facilitated a discussion on patient's challenges asking for help, identifying unhelpful thoughts, challenged thoughts leading to reluctance to seek support. Brained stormed options for increasing support. Reviewed mindfulness breathing and encouraged patient to practice daily. Provided support through active listening, validation of feelings, and  highlighted patient's strengths.   Diagnosis:   ICD-10-CM   1. Generalized anxiety disorder  F41.1   2. Major depressive disorder, recurrent episode, moderate (HCC)  F33.1     Plan: Increase session to a weekly cadance. Continue using CBTs to address symptoms or depressed mood and anxiety. Patient to continue to take medication as prescribed.   Interpreter used: NA   Milton Ferguson, LCSW

## 2019-07-18 ENCOUNTER — Other Ambulatory Visit: Payer: Self-pay

## 2019-07-18 ENCOUNTER — Ambulatory Visit: Payer: Medicaid Other | Attending: Obstetrics and Gynecology

## 2019-07-18 DIAGNOSIS — R293 Abnormal posture: Secondary | ICD-10-CM | POA: Diagnosis present

## 2019-07-18 DIAGNOSIS — M62838 Other muscle spasm: Secondary | ICD-10-CM | POA: Insufficient documentation

## 2019-07-18 NOTE — Therapy (Signed)
[place] MAIN [facility] SERVICES [address] [place], [place], [address] Phone: [phone]   Fax:  [phone]  Physical Therapy Treatment  The patient has been informed of current processes in place at Outpatient Rehab to protect patients from Covid-19 exposure including social distancing, schedule modifications, and new cleaning procedures. After discussing their particular risk with a therapist based on the patient's personal risk factors, the patient has decided to proceed with in-person therapy.   Patient Details  Name: Brenda Fuentes MRN: [id number] Date of Birth: 03-29-1996 Referring Provider (PT): [place], [place]   Encounter Date: 07/18/2019  PT End of Session - 07/18/19 1344    Visit Number  4    Number of Visits  10    Date for PT Re-Evaluation  09/06/19    Authorization Type  [employer]    Authorization Time Period  07/18/2019    Authorization - Visit Number  4    Authorization - Number of Visits  4    PT Start Time  1030    PT Stop Time  1130    PT Time Calculation (min)  60 min    Activity Tolerance  Patient tolerated treatment well;No increased pain       Past Medical History:  Diagnosis Date  . Anxiety   . Depression 2016  . Gestational hypertension   . Hypercholesteremia 2009    Past Surgical History:  Procedure Laterality Date  . NO PAST SURGERIES      There were no vitals filed for this visit.    Pelvic Floor Physical Therapy Treatment Note  SCREENING  Changes in medications, allergies, or medical history?: none    SUBJECTIVE  Patient reports: Pain had been doing better but has increased this morning with exiting the car and did not improve as she walked to the building like it normally does. Exercises are going well, getting easier.  Precautions:  [redacted] weeks pregnant  Location of pain: lower abdomen Current pain:  8/10  Max pain:  8/10 Least pain:  0/10 Nature of pain: sharp  *0/10 pain following  treatment.   Patient Goals: Get relief from pain   OBJECTIVE  Changes in: Posture/Observations:  Hyperlordotic, RLE long  Pelvic Floor: External Exam: Introitus Appears: normal Skin integrity: darkened skin surrounding vulva, likely pregnancy related  Palpation: TTP to R STP only Cough: paroxoxical Prolapse visible?: no Scar mobility: not assessed.  Internal Vaginal Exam: Strength (PERF): 3+/5, 8 sec, 1 time Symmetry: R>L for TTP Palpation: TTP throughout Prolapse: none  Palpation: TTP to  to R pectineus, B rectus abdominus   INTERVENTIONS THIS SESSION: Manual: Performed TP release to R pectineus, B rectus abdominus, and internally through B anterior PR to decrease spasm and pain and allow for improved balance of musculature for improved function and decreased symptoms. Performed grade 3-4 PA mobs to L sacral border to improve mobility of joint and surrounding connective tissue and decrease pressure on nerve roots for improved conductivity and function of down-stream tissues.  Therex: educated on and practiced seated hip flexor stretch and adductor stretch as well as happy-baby To maintain and improve muscle length and allow for improved balance of musculature for long-term symptom relief.   Total time: 60 min.                             PT Short Term Goals - 07/18/19 1349      PT SHORT TERM GOAL #  1   Title  Patient will demonstrate improved pelvic alignment and balance of musculature surrounding the pelvis to facilitate decreased PFM spasms and decrease pelvic pain.    Baseline  R up-slip and long LE, spasms surrounding pelvis    Time  5    Period  Weeks    Status  Achieved    Target Date  08/02/19      PT SHORT TERM GOAL #2   Title  Patient will report a reduction in pain to no greater than 5/10 over the prior week to demonstrate symptom improvement.    Baseline  10/10 max    Time  5    Period  Weeks    Status  On-going    Target  Date  08/02/19      PT SHORT TERM GOAL #3   Title  Patient will demonstrate HEP x1 in the clinic to demonstrate understanding and proper form to allow for further improvement.    Baseline  Pt. lacks knowledge of therepeutic exercises that can decrease her pain.    Time  5    Period  Weeks    Status  Achieved    Target Date  08/02/19        PT Long Term Goals - 07/18/19 1350      PT LONG TERM GOAL #1   Title  Patient will report urinating 6-8 times per day over the course of the prior week to demonstrate decreased frequency.    Baseline  Urinating every ~ 10 min.    Time  10    Period  Weeks    Status  Achieved    Target Date  09/06/19      PT LONG TERM GOAL #2   Title  Patient will report no pain with intercourse to demonstrate improved functional ability.    Baseline  Pain with deep thrusting radiating into R hip    Time  10    Period  Weeks    Status  On-going    Target Date  09/06/19      PT LONG TERM GOAL #3   Title  Patient will describe pain no greater than 2/10 during ADL's to demonstrate improved functional ability.    Baseline  Pain as high as 10/10    Time  10    Period  Weeks    Status  On-going    Target Date  09/06/19      PT LONG TERM GOAL #4   Title  Patient will score at or below 39% on the PGQ and 36% on the [facility] to demonstrate a clinically significant decrease in disability and improved functional ability.    Baseline  [place]: 47/60 (78%), PGQ: 55/75 (73%)    Time  10    Period  Weeks    Status  On-going    Target Date  09/06/19            Plan - 07/18/19 1345    Clinical Impression Statement  Pt. responded well to all interventions today, demonstrating a pain reduction from 8/10 to 0/10 following treatment. Further examination of PFM today revealed the already suspected spasms which were contributing via referred pain to the lower abdominal pain Pt. presented with and are also causing pain with intercourse and increased urinary frequency. She has  made progress toward or met all goals but continues to have PFM spasms and variable pain that will continue to benefit from further PT intervention. We will continue for 6  more weeks at 1x/wk to continue to progress toward goals and strengthen the deep core to prevent return of pain and spasm.    Personal Factors and Comorbidities  Comorbidity 3+    Comorbidities  pregnancy, leg-length discrepancy, Anxiety, Depression.    Examination-Activity Limitations  Carry;Lift;Sit;Stand;Locomotion Level;Transfers;Squat;Caring for Others;Stairs    Examination-Participation Restrictions  Interpersonal Relationship;Laundry;Yard Work    [employer]  Evolving/Moderate complexity    Clinical Decision Making  Moderate    Rehab Potential  Good    PT Frequency  1x / week    PT Duration  6 weeks   10 weeks   PT Treatment/Interventions  ADLs/Self Care Home Management;Biofeedback;Gait training;Therapeutic exercise;Therapeutic activities;Functional mobility training;Balance training;Neuromuscular re-education;Patient/family education;Manual techniques;Passive range of motion;Dry needling;Taping;Spinal Manipulations;Joint Manipulations    PT Next Visit Plan  TP release internally and deep-core strengthening.    PT Home Exercise Plan  heel-lift, pelvic tilt in seated, TA in mod-quad, seated hip-flexor and adductor stretches, happy baby stretch with strap.    Consulted and Agree with Plan of Care  Patient       Patient will benefit from skilled therapeutic intervention in order to improve the following deficits and impairments:  Decreased activity tolerance, Decreased strength, Increased fascial restricitons, Improper body mechanics, Pain, Obesity, Postural dysfunction, Decreased mobility, Impaired flexibility, Increased muscle spasms, Difficulty walking, Decreased coordination  Visit Diagnosis: 1. Other muscle spasm   2. Abnormal posture        Problem List Patient Active Problem List    Diagnosis Date Noted  . Generalized anxiety disorder 05/02/2019  . Major depressive disorder, recurrent episode, moderate ([place]) 05/02/2019  . Maternal varicella, non-immune 04/23/2019  . Anxiety and depression 04/23/2019  . Obesity in pregnancy 04/23/2019  . History of preterm delivery 02/18/2019  . History of gestational hypertension 02/18/2019   Willa Rough DPT, ATC Willa Rough 07/18/2019, 1:52 PM  [place] MAIN [facility] SERVICES [address] [place], [place], [address] Phone: [phone]   Fax:  [phone]  Name: NADEAN MONTANARO MRN: [id number] Date of Birth: [birth year]

## 2019-07-19 ENCOUNTER — Ambulatory Visit (INDEPENDENT_AMBULATORY_CARE_PROVIDER_SITE_OTHER): Payer: Medicaid Other | Admitting: Certified Nurse Midwife

## 2019-07-19 ENCOUNTER — Other Ambulatory Visit: Payer: Self-pay

## 2019-07-19 ENCOUNTER — Encounter: Payer: Self-pay | Admitting: Certified Nurse Midwife

## 2019-07-19 VITALS — BP 119/71 | HR 85 | Wt 204.5 lb

## 2019-07-19 DIAGNOSIS — Z3480 Encounter for supervision of other normal pregnancy, unspecified trimester: Secondary | ICD-10-CM

## 2019-07-19 LAB — POCT URINALYSIS DIPSTICK OB
Bilirubin, UA: NEGATIVE
Blood, UA: NEGATIVE
Glucose, UA: NEGATIVE
Ketones, UA: NEGATIVE
Leukocytes, UA: NEGATIVE
Nitrite, UA: NEGATIVE
POC,PROTEIN,UA: NEGATIVE
Spec Grav, UA: 1.01 (ref 1.010–1.025)
Urobilinogen, UA: 0.2 E.U./dL
pH, UA: 8.5 — AB (ref 5.0–8.0)

## 2019-07-19 MED ORDER — CITALOPRAM HYDROBROMIDE 40 MG PO TABS: 40.0000 mg | ORAL_TABLET | Freq: Every day | ORAL | 2 refills | Status: DC

## 2019-07-19 NOTE — Progress Notes (Signed)
Routine Prenatal from 07/19/2019 in [facility]  PHQ-9 Total Score  18     ROB , pt states she has been taking her celexa  Daily and does not feel like it is helping . Her phq9 score increased by 1 pt since last visit. Discussed increasing her dose. Reviewed risks  And benefits. She would like to try it. Order placed. Reviewed birth plan, plans medications /epidural. Plans Paragaurd for birth control. Follow up 2 wks.   Philip Aspen, CNM

## 2019-07-19 NOTE — Addendum Note (Signed)
Addended by: Raliegh Ip on: 07/19/2019 11:58 AM   Modules accepted: Orders

## 2019-07-19 NOTE — Patient Instructions (Signed)
How a Baby Grows During Pregnancy  Pregnancy begins when a female's sperm enters a female's egg (fertilization). Fertilization usually happens in one of the tubes (fallopian tubes) that connect the ovaries to the womb (uterus). The fertilized egg moves down the fallopian tube to the uterus. Once it reaches the uterus, it implants into the lining of the uterus and begins to grow. For the first 10 weeks, the fertilized egg is called an embryo. After 10 weeks, it is called a fetus. As the fetus continues to grow, it receives oxygen and nutrients through tissue (placenta) that grows to support the developing baby. The placenta is the life support system for the baby. It provides oxygen and nutrition and removes waste. Learning as much as you can about your pregnancy and how your baby is developing can help you enjoy the experience. It can also make you aware of when there might be a problem and when to ask questions. How long does a typical pregnancy last? A pregnancy usually lasts 280 days, or about 40 weeks. Pregnancy is divided into three periods of growth, also called trimesters:  First trimester: 0-12 weeks.  Second trimester: 13-27 weeks.  Third trimester: 28-40 weeks. The day when your baby is ready to be born (full term) is your estimated date of delivery. How does my baby develop month by month? First month  The fertilized egg attaches to the inside of the uterus.  Some cells will form the placenta. Others will form the fetus.  The arms, legs, brain, spinal cord, lungs, and heart begin to develop.  At the end of the first month, the heart begins to beat. Second month  The bones, inner ear, eyelids, hands, and feet form.  The genitals develop.  By the end of 8 weeks, all major organs are developing. Third month  All of the internal organs are forming.  Teeth develop below the gums.  Bones and muscles begin to grow. The spine can flex.  The skin is transparent.  Fingernails  and toenails begin to form.  Arms and legs continue to grow longer, and hands and feet develop.  The fetus is about 3 inches (7.6 cm) long. Fourth month  The placenta is completely formed.  The external sex organs, neck, outer ear, eyebrows, eyelids, and fingernails are formed.  The fetus can hear, swallow, and move its arms and legs.  The kidneys begin to produce urine.  The skin is covered with a white, waxy coating (vernix) and very fine hair (lanugo). Fifth month  The fetus moves around more and can be felt for the first time (quickening).  The fetus starts to sleep and wake up and may begin to suck its finger.  The nails grow to the end of the fingers.  The organ in the digestive system that makes bile (gallbladder) functions and helps to digest nutrients.  If your baby is a girl, eggs are present in her ovaries. If your baby is a boy, testicles start to move down into his scrotum. Sixth month  The lungs are formed.  The eyes open. The brain continues to develop.  Your baby has fingerprints and toe prints. Your baby's hair grows thicker.  At the end of the second trimester, the fetus is about 9 inches (22.9 cm) long. Seventh month  The fetus kicks and stretches.  The eyes are developed enough to sense changes in light.  The hands can make a grasping motion.  The fetus responds to sound. Eighth month  All   organs and body systems are fully developed and functioning.  Bones harden, and taste buds develop. The fetus may hiccup.  Certain areas of the brain are still developing. The skull remains soft. Ninth month  The fetus gains about  lb (0.23 kg) each week.  The lungs are fully developed.  Patterns of sleep develop.  The fetus's head typically moves into a head-down position (vertex) in the uterus to prepare for birth.  The fetus weighs 6-9 lb (2.72-4.08 kg) and is 19-20 inches (48.26-50.8 cm) long. What can I do to have a healthy pregnancy and help  my baby develop? General instructions  Take prenatal vitamins as directed by your health care provider. These include vitamins such as folic acid, iron, calcium, and vitamin D. They are important for healthy development.  Take medicines only as directed by your health care provider. Read labels and ask a pharmacist or your health care provider whether over-the-counter medicines, supplements, and prescription drugs are safe to take during pregnancy.  Keep all follow-up visits as directed by your health care provider. This is important. Follow-up visits include prenatal care and screening tests. How do I know if my baby is developing well? At each prenatal visit, your health care provider will do several different tests to check on your health and keep track of your baby's development. These include:  Fundal height and position. ? Your health care provider will measure your growing belly from your pubic bone to the top of the uterus using a tape measure. ? Your health care provider will also feel your belly to determine your baby's position.  Heartbeat. ? An ultrasound in the first trimester can confirm pregnancy and show a heartbeat, depending on how far along you are. ? Your health care provider will check your baby's heart rate at every prenatal visit.  Second trimester ultrasound. ? This ultrasound checks your baby's development. It also may show your baby's gender. What should I do if I have concerns about my baby's development? Always talk with your health care provider about any concerns that you may have about your pregnancy and your baby. Summary  A pregnancy usually lasts 280 days, or about 40 weeks. Pregnancy is divided into three periods of growth, also called trimesters.  Your health care provider will monitor your baby's growth and development throughout your pregnancy.  Follow your health care provider's recommendations about taking prenatal vitamins and medicines during  your pregnancy.  Talk with your health care provider if you have any concerns about your pregnancy or your developing baby. This information is not intended to replace advice given to you by your health care provider. Make sure you discuss any questions you have with your health care provider. Document Released: 05/16/2008 Document Revised: 03/21/2019 Document Reviewed: 10/11/2017 Elsevier Patient Education  2020 [employer] Inc[employer]  

## 2019-07-23 ENCOUNTER — Ambulatory Visit: Payer: Medicaid Other | Admitting: Licensed Clinical Social Worker

## 2019-07-23 DIAGNOSIS — F411 Generalized anxiety disorder: Secondary | ICD-10-CM

## 2019-07-23 DIAGNOSIS — F331 Major depressive disorder, recurrent, moderate: Secondary | ICD-10-CM

## 2019-07-23 NOTE — Progress Notes (Signed)
Counselor/Therapist Progress Note  Patient ID: Brenda Fuentes, MRN: [id number],    Date: 07/23/2019  Time Spent: 45 minutes    Treatment Type: Psychotherapy  Reported Symptoms: Sleep disturbance, Physical aches and pain and mild mood improvement   Mental Status Exam:  Appearance:   NA     Behavior:  Appropriate and Sharing  Motor:  NA  Speech/Language:   Normal Rate  Affect:  NA  Mood:  normal  Thought process:  normal  Thought content:    WNL  Sensory/Perceptual disturbances:    WNL  Orientation:  oriented to person, place and time/date  Attention:  Good  Concentration:  Good  Memory:  WNL  Fund of knowledge:   Good  Insight:    Good  Judgment:   Good  Impulse Control:  Good   Risk Assessment: Danger to Self:  No Self-injurious Behavior: No Danger to Others: No Duty to Warn:no Physical Aggression / Violence:No  Access to Firearms a concern: No  Gang Involvement:No   Subjective: Patient was engaged and cooperative throughout the session using time effectively to discuss thoughts and feelings. Patient voices overall mood improvement, but continues to share concerns of physical pain that she is addressing through physical therapy. Patient voices continued motivation for treatment and understanding of depression and anxiety issues. Patient is likely to benefit from future treatment because she remains motivated to decrease symptoms and reports benefit of medication management in combination with regular sessions in addressing these symptoms.    Interventions: Cognitive Behavioral Therapy  Established psychological safety. Encouraged patient to explore what triggers her symptoms and what reduces symptoms. Reviewed mindfulness and taught patient about using the 5 senses and how to generalize use into every day activities. Encouraged patient to practice mindfulness techniques daily and to continue to seek support as needed. Provided support through active listening, validation  of feelings, and highlighted patient's strengths.    Diagnosis:   ICD-10-CM   1. Generalized anxiety disorder  F41.1   2. Major depressive disorder, recurrent episode, moderate ([facility])  F33.1     Plan: Continue CBTs though weekly sessions to mange symptoms of anxiety and depression. Patient to continue medication management.   Interpreter used: NA    Milton Ferguson, LCSW

## 2019-07-25 ENCOUNTER — Encounter: Payer: Self-pay | Admitting: Certified Nurse Midwife

## 2019-07-25 ENCOUNTER — Ambulatory Visit: Payer: Medicaid Other

## 2019-07-25 ENCOUNTER — Other Ambulatory Visit: Payer: Self-pay | Admitting: Certified Nurse Midwife

## 2019-07-25 ENCOUNTER — Other Ambulatory Visit: Payer: Self-pay

## 2019-07-25 DIAGNOSIS — Z8759 Personal history of other complications of pregnancy, childbirth and the puerperium: Secondary | ICD-10-CM

## 2019-07-25 DIAGNOSIS — O99210 Obesity complicating pregnancy, unspecified trimester: Secondary | ICD-10-CM

## 2019-07-25 DIAGNOSIS — R293 Abnormal posture: Secondary | ICD-10-CM

## 2019-07-25 DIAGNOSIS — Z8751 Personal history of pre-term labor: Secondary | ICD-10-CM

## 2019-07-25 DIAGNOSIS — M62838 Other muscle spasm: Secondary | ICD-10-CM

## 2019-07-25 DIAGNOSIS — Z79899 Other long term (current) drug therapy: Secondary | ICD-10-CM

## 2019-07-25 NOTE — Telephone Encounter (Signed)
Please contact patient and add growth ultrasound to visit with Melody next week. Thanks, JML

## 2019-07-25 NOTE — Therapy (Signed)
[place] [facility]IN [facility] [address] [place], [place], [address] Phone: [phone]   Fax:  [phone]  Physical Therapy Treatment  The patient has been informed of current processes in place at Outpatient Rehab to protect patients from Covid-19 exposure including social distancing, schedule modifications, and new cleaning procedures. After discussing their particular risk with a therapist based on the patient's personal risk factors, the patient has decided to proceed with in-person therapy.   Patient Details  Name: Brenda Fuentes MRN: [id number] Date of Birth: 10-28-1996 Referring Provider (PT): [place], [place]   Encounter Date: 07/25/2019  PT End of Session - 07/25/19 1325    Visit Number  5    Number of Visits  10    Date for PT Re-Evaluation  09/06/19    Authorization Type  MCAID    Authorization Time Period  10/02/2019    Authorization - Visit Number  1    Authorization - Number of Visits  10    PT Start Time  1033    PT Stop Time  1126    PT Time Calculation (min)  53 min    Activity Tolerance  Patient tolerated treatment well;No increased pain       Past Medical History:  Diagnosis Date  . Anxiety   . Depression 2016  . Gestational hypertension   . Hypercholesteremia 2009    Past Surgical History:  Procedure Laterality Date  . NO PAST SURGERIES      There were no vitals filed for this visit.    Pelvic Floor Physical Therapy Treatment Note  SCREENING  Changes in medications, allergies, or medical history?: none    SUBJECTIVE  Patient reports: She is doing much better as far as pain but having to urinate ~ every 10 min.  Precautions:  [redacted] weeks pregnant  Location of pain: lower abdomen/LB Current pain:  2/10  Max pain:  3/10 Least pain:  0/10 Nature of pain: sharp  *0/10 pain following treatment.   Patient Goals: Get relief from pain   OBJECTIVE  Changes in: Posture/Observations:   Anterior pelvic tilt, genu recurvatum, forward shoulders.  Pelvic Floor: TTP throughout R>L internally. Resolved following treatment.   INTERVENTIONS THIS SESSION: Manual: Performed TP release to all PFM internally to decrease spasm and pain and allow for improved balance of musculature for improved function and decreased symptoms.  Theact: educated on standing posture and keeping the TA activated when walking or standing to decrease tension on low back and prevent return of spasms/pain. Applied K-tape and educated on where to buy/how to use at home to improve TA engagement through the day since she cannot afford belly-brace at this time.  Total time: 53 min.                           PT Short Term Goals - 07/18/19 1349      PT SHORT TERM GOAL #1   Title  Patient will demonstrate improved pelvic alignment and balance of musculature surrounding the pelvis to facilitate decreased PFM spasms and decrease pelvic pain.    Baseline  R up-slip and long LE, spasms surrounding pelvis    Time  5    Period  Weeks    Status  Achieved    Target Date  08/02/19      PT SHORT TERM GOAL #2   Title  Patient will report a reduction in pain to no greater than 5/10  over the prior week to demonstrate symptom improvement.    Baseline  10/10 max    Time  5    Period  Weeks    Status  On-going    Target Date  08/02/19      PT SHORT TERM GOAL #3   Title  Patient will demonstrate HEP x1 in the clinic to demonstrate understanding and proper form to allow for further improvement.    Baseline  Pt. lacks knowledge of therepeutic exercises that can decrease her pain.    Time  5    Period  Weeks    Status  Achieved    Target Date  08/02/19        PT Long Term Goals - 07/18/19 1350      PT LONG TERM GOAL #1   Title  Patient will report urinating 6-8 times per day over the course of the prior week to demonstrate decreased frequency.    Baseline  Urinating every ~ 10 min.    Time   10    Period  Weeks    Status  Achieved    Target Date  09/06/19      PT LONG TERM GOAL #2   Title  Patient will report no pain with intercourse to demonstrate improved functional ability.    Baseline  Pain with deep thrusting radiating into R hip    Time  10    Period  Weeks    Status  On-going    Target Date  09/06/19      PT LONG TERM GOAL #3   Title  Patient will describe pain no greater than 2/10 during ADL's to demonstrate improved functional ability.    Baseline  Pain as high as 10/10    Time  10    Period  Weeks    Status  On-going    Target Date  09/06/19      PT LONG TERM GOAL #4   Title  Patient will score at or below 39% on the PGQ and 36% on the [facility] to demonstrate a clinically significant decrease in disability and improved functional ability.    Baseline  PDI: 47/60 (78%), PGQ: 55/75 (73%)    Time  10    Period  Weeks    Status  On-going    Target Date  09/06/19            Plan - 07/25/19 1335    Clinical Impression Statement  Pt. Responded well to all interventions today, demonstrating decreased spasms and tenderness to palpation, decreased tension and improved posture as well as understanding and correct performance of all education and exercises provided today. They will continue to benefit from skilled physical therapy to work toward remaining goals and maximize function as well as decrease likelihood of symptom increase or recurrence.     Personal Factors and Comorbidities  Comorbidity 3+    Comorbidities  pregnancy, leg-length discrepancy, Anxiety, Depression.    Examination-Activity Limitations  Carry;Lift;Sit;Stand;Locomotion Level;Transfers;Squat;Caring for Others;Stairs    Examination-Participation Restrictions  Interpersonal Relationship;Laundry;Yard Work    Conservation officer, historic buildings  Evolving/Moderate complexity    Rehab Potential  Good    PT Frequency  1x / week    PT Duration  6 weeks   10 weeks   PT Treatment/Interventions   ADLs/Self Care Home Management;Biofeedback;Gait training;Therapeutic exercise;Therapeutic activities;Functional mobility training;Balance training;Neuromuscular re-education;Patient/family education;Manual techniques;Passive range of motion;Dry needling;Taping;Spinal Manipulations;Joint Manipulations    PT Next Visit Plan  progress deep-core strengtheneing and give hip  EXT and ABD strengthening.    PT Home Exercise Plan  heel-lift, pelvic tilt in seated, TA in mod-quad, seated hip-flexor and adductor stretches, happy baby stretch with strap.    Consulted and Agree with Plan of Care  Patient       Patient will benefit from skilled therapeutic intervention in order to improve the following deficits and impairments:  Decreased activity tolerance, Decreased strength, Increased fascial restricitons, Improper body mechanics, Pain, Obesity, Postural dysfunction, Decreased mobility, Impaired flexibility, Increased muscle spasms, Difficulty walking, Decreased coordination  Visit Diagnosis: 1. Other muscle spasm   2. Abnormal posture        Problem List Patient Active Problem List   Diagnosis Date Noted  . Generalized anxiety disorder 05/02/2019  . Major depressive disorder, recurrent episode, moderate ([place]) 05/02/2019  . Maternal varicella, non-immune 04/23/2019  . Anxiety and depression 04/23/2019  . Obesity in pregnancy 04/23/2019  . History of preterm delivery 02/18/2019  . History of gestational hypertension 02/18/2019   Willa Rough DPT, ATC Willa Rough 07/25/2019, 1:37 PM  [place] MAIN [facility] SERVICES [address] [place], [place], [address] Phone: [phone]   Fax:  [phone]  Name: Brenda Fuentes MRN: [id number] Date of Birth: 04/16/1996

## 2019-07-30 ENCOUNTER — Ambulatory Visit: Payer: Medicaid Other | Admitting: Licensed Clinical Social Worker

## 2019-07-30 DIAGNOSIS — F411 Generalized anxiety disorder: Secondary | ICD-10-CM

## 2019-07-30 DIAGNOSIS — F331 Major depressive disorder, recurrent, moderate: Secondary | ICD-10-CM

## 2019-07-30 NOTE — Progress Notes (Signed)
Counselor/Therapist Progress Note  Patient ID: LYBERTI THRUSH, MRN: [id number],    Date: 07/30/2019  Time Spent: 20 minutes    Treatment Type: Psychotherapy  Reported Symptoms: anxiety, feeling overwhelmed, anxious thoughts  Mental Status Exam:  Appearance:   NA     Behavior:  Appropriate and Sharing  Motor:  NA  Speech/Language:   Normal Rate  Affect:  NA  Mood:  normal  Thought process:  normal  Thought content:    WNL  Sensory/Perceptual disturbances:    WNL  Orientation:  oriented to person, place and time/date  Attention:  Good  Concentration:  Good  Memory:  WNL  Fund of knowledge:   Good  Insight:    Good  Judgment:   Good  Impulse Control:  Good   Risk Assessment: Danger to Self:  No Self-injurious Behavior: No Danger to Others: No Duty to Warn:no Physical Aggression / Violence:No  Access to Firearms a concern: No  Gang Involvement:No   Subjective: Patient was engaged and cooperative throughout the session using time effectively to discuss thoughts and feelings. Patient continues to voice anxiety/depression symptoms. She reports feeling overwhelmed, difficulties concentrating, and staying on task due to many distractions that need her attention. Patient voices continued motivation for treatment and is likely to benefit from future therapy in combination with continued medication management.    Interventions: Cognitive Behavioral Therapy  Established psychological safety. Engaged patient in processing current psychosocial stressors. Explored patient's priorities/goals. Assisted patient in making a plan to help with focus/concentration, including deep breathing exercises, taking short breaks to stretch, get water and/or a snack, using a timer. Provided support through active listening, validation of feelings, and highlighted patient's strengths. Notified patient of LCSW's paid time off (PTO).    Diagnosis:   ICD-10-CM   1. Generalized anxiety disorder  F41.1    2. Major depressive disorder, recurrent episode, moderate ([place])  F33.1     Plan: Follow up with patient regarding completion of school semester. Continue CBTs to build patient's skills to manage symptoms of anxiety/depression.   Interpreter used: NA   Milton Ferguson, LCSW

## 2019-08-01 ENCOUNTER — Ambulatory Visit: Payer: Medicaid Other

## 2019-08-01 ENCOUNTER — Ambulatory Visit (INDEPENDENT_AMBULATORY_CARE_PROVIDER_SITE_OTHER): Payer: Medicaid Other | Admitting: Obstetrics and Gynecology

## 2019-08-01 ENCOUNTER — Ambulatory Visit (INDEPENDENT_AMBULATORY_CARE_PROVIDER_SITE_OTHER): Payer: Medicaid Other

## 2019-08-01 ENCOUNTER — Other Ambulatory Visit: Payer: Self-pay

## 2019-08-01 VITALS — BP 120/72 | HR 85 | Wt 207.6 lb

## 2019-08-01 DIAGNOSIS — Z113 Encounter for screening for infections with a predominantly sexual mode of transmission: Secondary | ICD-10-CM

## 2019-08-01 DIAGNOSIS — M62838 Other muscle spasm: Secondary | ICD-10-CM | POA: Diagnosis not present

## 2019-08-01 DIAGNOSIS — Z3685 Encounter for antenatal screening for Streptococcus B: Secondary | ICD-10-CM

## 2019-08-01 DIAGNOSIS — Z79899 Other long term (current) drug therapy: Secondary | ICD-10-CM

## 2019-08-01 DIAGNOSIS — Z3493 Encounter for supervision of normal pregnancy, unspecified, third trimester: Secondary | ICD-10-CM

## 2019-08-01 DIAGNOSIS — Z8759 Personal history of other complications of pregnancy, childbirth and the puerperium: Secondary | ICD-10-CM

## 2019-08-01 DIAGNOSIS — Z8751 Personal history of pre-term labor: Secondary | ICD-10-CM

## 2019-08-01 DIAGNOSIS — O99210 Obesity complicating pregnancy, unspecified trimester: Secondary | ICD-10-CM

## 2019-08-01 DIAGNOSIS — R293 Abnormal posture: Secondary | ICD-10-CM

## 2019-08-01 DIAGNOSIS — Z362 Encounter for other antenatal screening follow-up: Secondary | ICD-10-CM | POA: Diagnosis not present

## 2019-08-01 DIAGNOSIS — Z3A34 34 weeks gestation of pregnancy: Secondary | ICD-10-CM

## 2019-08-01 LAB — POCT URINALYSIS DIPSTICK OB
Bilirubin, UA: NEGATIVE
Blood, UA: NEGATIVE
Glucose, UA: NEGATIVE
Ketones, UA: NEGATIVE
Nitrite, UA: NEGATIVE
POC,PROTEIN,UA: NEGATIVE
Spec Grav, UA: 1.015 (ref 1.010–1.025)
Urobilinogen, UA: 0.2 E.U./dL
pH, UA: 6 (ref 5.0–8.0)

## 2019-08-01 LAB — OB RESULTS CONSOLE GBS: GBS: NEGATIVE

## 2019-08-01 LAB — OB RESULTS CONSOLE GC/CHLAMYDIA: Gonorrhea: NEGATIVE

## 2019-08-01 NOTE — Patient Instructions (Addendum)
Hip Abduction (Standing)    Stand with support. Squeeze deep core and hold. Start with leg to the side/back on your toe and gently squeeze the outer hip to lift the toe off of the ground. Hold for 1 second then repeat. You should feel this in the outer hip, not the front, not your side,  And not your back. Repeat __10_ times. Do _3__ sets  a day.  HIP Extension - Standing    Stand with support. Squeeze deep core and hold. Start with leg to the back on your toe and gently squeeze the buttock to lift the toe off of the ground. Hold for 1 second then repeat. You should feel this in the outer hip, not the front, not your side,  And not your back. Repeat __10_ times. Do _3__ sets  a day.   Kegel exercises:    With neutral spine, tighten pelvic floor by imagining you are stopping the flow of urine, squeezing only around the vagina and anus.  Quick-Flicks: Pull up and in quickly and then relax allowing just enough time for the muscles to full lengthen before the next contraction. Do _10__ repetitions in a row, stopping if the pelvic floor muscle gets tired and other muscles try to take over.  Long-Holds: Hold for _4__ seconds and then fully release, repeat _4__ times. (only hold until you can feel the muscles "give up")  Repeat both of these exercises _4__ times throughout the day     If this is difficult, start by performing while lying down as shown. As you get stronger, you can try in sitting, standing, etc. Make sure that you are NOT contracting your buttocks or inner thigh muscles!  Tip: If you are having a hard time feeling the muscles, try using a hand-held mirror to observe the muscles lift and draw in or feel with your hand to make sure you are squeezing when you think you are squeezing, not bearing down. Some people find it helpful to pretend you are "picking blueberries" with the vagina to feel the drawing in or try to make the clitoris "nod" and pull your sitz bones in toward the  middle.

## 2019-08-01 NOTE — Therapy (Signed)
[place] [facility] MAIN [facility] SERVICES [address] [place], [place], [address] Phone: [phone]   Fax:  [phone]  Physical Therapy Treatment  The patient has been informed of current processes in place at Outpatient Rehab to protect patients from Covid-19 exposure including social distancing, schedule modifications, and new cleaning procedures. After discussing their particular risk with a therapist based on the patient's personal risk factors, the patient has decided to proceed with in-person therapy.   Patient Details  Name: Brenda Fuentes MRN: [id number] Date of Birth: [birth year] Referring Provider (PT): [place], [place]   Encounter Date: 08/01/2019  PT End of Session - 08/01/19 1123    Visit Number  6    Number of Visits  14    Date for PT Re-Evaluation  09/06/19    Authorization Type  MCAID    Authorization Time Period  10/02/2019    Authorization - Visit Number  2    Authorization - Number of Visits  10    PT Start Time  1031    PT Stop Time  1121    PT Time Calculation (min)  50 min    Activity Tolerance  Patient tolerated treatment well;No increased pain    Behavior During Therapy  WFL for tasks assessed/performed       Past Medical History:  Diagnosis Date  . Anxiety   . Depression 2016  . Gestational hypertension   . Hypercholesteremia 2009    Past Surgical History:  Procedure Laterality Date  . NO PAST SURGERIES      There were no vitals filed for this visit.    Pelvic Floor Physical Therapy Treatment Note  SCREENING  Changes in medications, allergies, or medical history?: none    SUBJECTIVE  Patient reports: She is urinating every 20 min. To 1 hr. Less pain with intercourse.  Precautions:  [redacted] weeks pregnant  Location of pain: lower abdomen Current pain:  0/10  Max pain:  5/10 Least pain:  0/10 Nature of pain: sharp  *0/10 pain following treatment. Reports feels "a lot better" even though stated  pain was 0/10 at beginning of session.  Patient Goals: Get relief from pain   OBJECTIVE  Changes in: Strength:  Greater difficulty stabilizing for hip EXT/ABD when standing on RLE.   Pelvic Floor: Decreased TTP compared to last visit, resolved quickly but continue to be present throughout all except L posterior PR.   INTERVENTIONS THIS SESSION: Manual: Performed TP release to all PFM internally except L posterior PR to decrease spasm and pain and allow for improved balance of musculature for improved function and decreased symptoms.  Therex: educated on and practiced standing hip EXT and ABD to strengthen pelvic girdle and allof for PFM relaxation. Educated on and practiced kegels with posterior pelvic tilt in hook-lying to improve coordination and strength of muscles within and surrounding the pelvis to create balance and prevent return of spasms. Theact: Re-applied K-tape to improve TA engagement through the day since she cannot afford belly-brace at this time.  Total time: 50 min.                                   PT Short Term Goals - 07/18/19 1349      PT SHORT TERM GOAL #1   Title  Patient will demonstrate improved pelvic alignment and balance of musculature surrounding the pelvis to facilitate decreased PFM spasms and decrease pelvic  pain.    Baseline  R up-slip and long LE, spasms surrounding pelvis    Time  5    Period  Weeks    Status  Achieved    Target Date  08/02/19      PT SHORT TERM GOAL #2   Title  Patient will report a reduction in pain to no greater than 5/10 over the prior week to demonstrate symptom improvement.    Baseline  10/10 max    Time  5    Period  Weeks    Status  On-going    Target Date  08/02/19      PT SHORT TERM GOAL #3   Title  Patient will demonstrate HEP x1 in the clinic to demonstrate understanding and proper form to allow for further improvement.    Baseline  Pt. lacks knowledge of therepeutic exercises  that can decrease her pain.    Time  5    Period  Weeks    Status  Achieved    Target Date  08/02/19        PT Long Term Goals - 07/18/19 1350      PT LONG TERM GOAL #1   Title  Patient will report urinating 6-8 times per day over the course of the prior week to demonstrate decreased frequency.    Baseline  Urinating every ~ 10 min.    Time  10    Period  Weeks    Status  Achieved    Target Date  09/06/19      PT LONG TERM GOAL #2   Title  Patient will report no pain with intercourse to demonstrate improved functional ability.    Baseline  Pain with deep thrusting radiating into R hip    Time  10    Period  Weeks    Status  On-going    Target Date  09/06/19      PT LONG TERM GOAL #3   Title  Patient will describe pain no greater than 2/10 during ADL's to demonstrate improved functional ability.    Baseline  Pain as high as 10/10    Time  10    Period  Weeks    Status  On-going    Target Date  09/06/19      PT LONG TERM GOAL #4   Title  Patient will score at or below 39% on the PGQ and 36% on the [facility] to demonstrate a clinically significant decrease in disability and improved functional ability.    Baseline  PDI: 47/60 (78%), PGQ: 55/75 (73%)    Time  10    Period  Weeks    Status  On-going    Target Date  09/06/19            Plan - 08/01/19 1124    Clinical Impression Statement  Pt. Responded well to all interventions today, demonstrating improved PFM recruitment and decreased spasms and discomfort as well as understanding and correct performance of all education and exercises provided today. They will continue to benefit from skilled physical therapy to work toward remaining goals and maximize function as well as decrease likelihood of symptom increase or recurrence.     Personal Factors and Comorbidities  Comorbidity 3+    Comorbidities  pregnancy, leg-length discrepancy, Anxiety, Depression.    Examination-Activity Limitations  Carry;Lift;Sit;Stand;Locomotion  Level;Transfers;Squat;Caring for Others;Stairs    Examination-Participation Restrictions  Interpersonal Relationship;Laundry;Yard Work    Nurse, learning disability  Potential  Good    PT Frequency  1x / week    PT Duration  6 weeks   10 weeks   PT Treatment/Interventions  ADLs/Self Care Home Management;Biofeedback;Gait training;Therapeutic exercise;Therapeutic activities;Functional mobility training;Balance training;Neuromuscular re-education;Patient/family education;Manual techniques;Passive range of motion;Dry needling;Taping;Spinal Manipulations;Joint Manipulations    PT Next Visit Plan  review pelvic tilt with kegel (give supine pelvic tilt handout)    PT Home Exercise Plan  heel-lift, pelvic tilt in seated, TA in mod-quad, seated hip-flexor and adductor stretches, happy baby stretch with strap kegels, standing hip EXT and ABD.    Consulted and Agree with Plan of Care  Patient       Patient will benefit from skilled therapeutic intervention in order to improve the following deficits and impairments:  Decreased activity tolerance, Decreased strength, Increased fascial restricitons, Improper body mechanics, Pain, Obesity, Postural dysfunction, Decreased mobility, Impaired flexibility, Increased muscle spasms, Difficulty walking, Decreased coordination  Visit Diagnosis: Other muscle spasm  Abnormal posture     Problem List Patient Active Problem List   Diagnosis Date Noted  . Generalized anxiety disorder 05/02/2019  . Major depressive disorder, recurrent episode, moderate ([facility]) 05/02/2019  . Maternal varicella, non-immune 04/23/2019  . Anxiety and depression 04/23/2019  . Obesity in pregnancy 04/23/2019  . History of preterm delivery 02/18/2019  . History of gestational hypertension 02/18/2019   Cleophus MoltKeeli T. Gailes DPT, ATC Cleophus MoltKeeli T Gailes 08/01/2019, 11:34 AM  [place] [facility] [address] [place], [place], [address] Phone: [phone]   Fax:  [phone]  Name: Brenda Fuentes MRN: [id number] Date of Birth: [birth year]

## 2019-08-01 NOTE — Progress Notes (Signed)
ROB and growth scan- cultures obtained due to h/o PTD at 36 weeks.reassured of normal mucus.discussed PTL precautions. States since increased celexa depression and anxiety are 50% better. Has already stopped baby aspirin as she kept forgetting to take them. Indications:growth/afi Findings:  Brenda Fuentes intrauterine pregnancy is visualized with FHR at 153 BPM. Biometrics give an (U/S) Gestational age of [redacted]w[redacted]d and an (U/S) EDD of 09/11/2019; this correlates with the clinically established Estimated Date of Delivery: 09/10/19.  Fetal presentation is Cephalic.  Placenta: anterior. Grade: 1 AFI: 15.7 cm  Growth percentile is 41. EFW: 2347 g ( 5 lbs 3 oz)   Impression: 1. [redacted]w[redacted]d Viable Singleton Intrauterine pregnancy previously established criteria. 2. Growth is 41 %ile.  AFI is 15.7 cm.

## 2019-08-01 NOTE — Addendum Note (Signed)
Addended by: Keturah Barre L on: 08/01/2019 04:12 PM   Modules accepted: Orders

## 2019-08-01 NOTE — Progress Notes (Signed)
ROB- pt had growth scan, pt is having a lot of vaginal d/c, and pelvic pressure

## 2019-08-03 LAB — STREP GP B NAA+RFLX: Strep Gp B NAA+Rflx: NEGATIVE

## 2019-08-04 ENCOUNTER — Other Ambulatory Visit: Payer: Self-pay

## 2019-08-04 ENCOUNTER — Observation Stay
Admission: EM | Admit: 2019-08-04 | Discharge: 2019-08-05 | Disposition: A | Discharge status: Home / Self Care | Payer: Medicaid Other | Attending: Obstetrics and Gynecology | Admitting: Obstetrics and Gynecology

## 2019-08-04 DIAGNOSIS — R030 Elevated blood-pressure reading, without diagnosis of hypertension: Secondary | ICD-10-CM | POA: Insufficient documentation

## 2019-08-04 DIAGNOSIS — O26893 Other specified pregnancy related conditions, third trimester: Principal | ICD-10-CM | POA: Insufficient documentation

## 2019-08-04 DIAGNOSIS — Z3A34 34 weeks gestation of pregnancy: Secondary | ICD-10-CM | POA: Insufficient documentation

## 2019-08-04 LAB — GC/CHLAMYDIA PROBE AMP
Chlamydia trachomatis, NAA: NEGATIVE
Neisseria Gonorrhoeae by PCR: NEGATIVE

## 2019-08-04 NOTE — OB Triage Note (Signed)
23 yo, G2P1, [redacted]w[redacted]d checked blood pressure at home 139/90, came to unit for PIH eval. Reports she had gestational hypertension with first pregnancy, but BP has been ok so far during this one. Reports headache 6/10, right sided pain in ribcage as 9/10, and lower abdominal pressure with mild cramping. Reports blurred vision and floaters. +1 non pitting edema in lower extremities. Reflexes +2, no clonus. Denies LOF, vaginal bleeding, says she feels baby move ok. Monitors applied, BP 135/77, 122/66, 131/71.

## 2019-08-05 DIAGNOSIS — Z3A34 34 weeks gestation of pregnancy: Secondary | ICD-10-CM

## 2019-08-05 DIAGNOSIS — R030 Elevated blood-pressure reading, without diagnosis of hypertension: Secondary | ICD-10-CM

## 2019-08-05 DIAGNOSIS — O26893 Other specified pregnancy related conditions, third trimester: Secondary | ICD-10-CM

## 2019-08-05 LAB — PROTEIN / CREATININE RATIO, URINE
Creatinine, Urine: 24 mg/dL
Total Protein, Urine: <6 mg/dL

## 2019-08-05 MED ORDER — ACETAMINOPHEN 325 MG PO TABS
650.0000 mg | ORAL_TABLET | Freq: Four times a day (QID) | ORAL | Status: DC | PRN
  Administered 2019-08-05: 650 mg via ORAL
  Filled 2019-08-05: qty 2

## 2019-08-05 NOTE — Discharge Summary (Signed)
L&D OB Triage Note  Brenda Fuentes is a 23 y.o. G71P0101 female at [redacted]w[redacted]d, EDD Estimated Date of Delivery: 09/10/19 who presented to triage for complaints of elevated blood pressure readings at home, and rib pain.  She was evaluated by the nurses with no significant findings/findings significant for PIH. Vital signs stable. An NST was performed and has been reviewed by myself. She was treated with po tylenol and hydrated.  Reassured no signs of PIH at this time.   NST INTERPRETATION: Indications: patient reassurance  Mode: External Baseline Rate (A): 125 bpm Variability: Moderate Accelerations: 15 x 15 Decelerations: None     Contraction Frequency (min): irregular with UI  Impression: reactive   Plan: NST performed was reviewed and was found to be reactive. She was discharged home with PIH/bleeding/labor precautions.  Continue routine prenatal care. Follow up with OB/GYN as previously scheduled.     Melody Rockney Ghee, CNM

## 2019-08-08 ENCOUNTER — Ambulatory Visit: Payer: Medicaid Other

## 2019-08-09 ENCOUNTER — Ambulatory Visit (INDEPENDENT_AMBULATORY_CARE_PROVIDER_SITE_OTHER): Payer: Medicaid Other | Admitting: Certified Nurse Midwife

## 2019-08-09 ENCOUNTER — Other Ambulatory Visit: Payer: Self-pay

## 2019-08-09 VITALS — BP 125/88 | HR 74 | Wt 209.3 lb

## 2019-08-09 DIAGNOSIS — Z3493 Encounter for supervision of normal pregnancy, unspecified, third trimester: Secondary | ICD-10-CM

## 2019-08-09 LAB — POCT URINALYSIS DIPSTICK OB
Bilirubin, UA: NEGATIVE
Blood, UA: NEGATIVE
Glucose, UA: NEGATIVE
Ketones, UA: NEGATIVE
Nitrite, UA: NEGATIVE
POC,PROTEIN,UA: NEGATIVE
Spec Grav, UA: 1.02 (ref 1.010–1.025)
Urobilinogen, UA: 0.2 E.U./dL
pH, UA: 6 (ref 5.0–8.0)

## 2019-08-09 NOTE — Progress Notes (Signed)
ROB-Reports irregular contractions otherwise doing fine. Ready to have baby. History of preterm labor and 36 week birth. Cultures collected on 08/01/19 negative-recollected at 38 weeks. Anticipatory guidance regarding course of prenatal care. Reviewed red flag symptoms, signs of labor, and when to call. RTC x 1 week for ROB or sooner if needed.   Depression screen [facility] 2/9 08/09/2019 07/19/2019 07/04/2019 06/20/2019 05/21/2019  Decreased Interest 1 2 1 2 2   Down, Depressed, Hopeless 1 2 2 1 2   PHQ - 2 Score 2 4 3 3 4   Altered sleeping 3 3 3 3 3   Tired, decreased energy 2 2 2 3 3   Change in appetite 2 3 2 3 3   Feeling bad or failure about yourself  1 2 2 1 2   Trouble concentrating 1 2 3 3 1   Moving slowly or fidgety/restless 1 1 1 1  0  Suicidal thoughts 0 1 1 0 0  PHQ-9 Score 12 18 17 17 16   Difficult doing work/chores Somewhat difficult Very difficult Very difficult Very difficult Very difficult   GAD 7 : Generalized Anxiety Score 08/09/2019 07/19/2019 07/04/2019 06/20/2019  Nervous, Anxious, on Edge 1 2 2 1   Control/stop worrying 2 3 1 3   Worry too much - different things 2 3 2 3   Trouble relaxing 2 1 2 3   Restless 1 1 1 1   Easily annoyed or irritable 2 2 3 3   Afraid - awful might happen 1 1 2 1   Total GAD 7 Score 11 13 13 15   Anxiety Difficulty Somewhat difficult Very difficult Very difficult Somewhat difficult

## 2019-08-09 NOTE — Patient Instructions (Signed)

## 2019-08-09 NOTE — Progress Notes (Signed)
ROB-No complaints.  

## 2019-08-12 ENCOUNTER — Observation Stay
Admission: EM | Admit: 2019-08-12 | Discharge: 2019-08-12 | Disposition: A | Discharge status: Home / Self Care | Payer: Medicaid Other | Attending: Certified Nurse Midwife | Admitting: Certified Nurse Midwife

## 2019-08-12 ENCOUNTER — Other Ambulatory Visit: Payer: Self-pay

## 2019-08-12 DIAGNOSIS — Z79899 Other long term (current) drug therapy: Secondary | ICD-10-CM | POA: Insufficient documentation

## 2019-08-12 DIAGNOSIS — Z3A35 35 weeks gestation of pregnancy: Secondary | ICD-10-CM | POA: Diagnosis not present

## 2019-08-12 DIAGNOSIS — O4703 False labor before 37 completed weeks of gestation, third trimester: Secondary | ICD-10-CM | POA: Diagnosis not present

## 2019-08-12 LAB — URINALYSIS, ROUTINE W REFLEX MICROSCOPIC
Bilirubin Urine: NEGATIVE
Glucose, UA: NEGATIVE mg/dL
Hgb urine dipstick: NEGATIVE
Ketones, ur: NEGATIVE mg/dL
Nitrite: NEGATIVE
Protein, ur: NEGATIVE mg/dL
Specific Gravity, Urine: 1.011 (ref 1.005–1.030)
pH: 6 (ref 5.0–8.0)

## 2019-08-12 MED ORDER — LACTATED RINGERS IV SOLN: 500.0000 mL | INTRAVENOUS | Status: DC | PRN

## 2019-08-12 MED ORDER — TERBUTALINE SULFATE 1 MG/ML IJ SOLN
INTRAMUSCULAR | Status: AC
  Filled 2019-08-12: qty 1

## 2019-08-12 MED ORDER — TERBUTALINE SULFATE 1 MG/ML IJ SOLN
0.2500 mg | Freq: Once | INTRAMUSCULAR | Status: AC
  Administered 2019-08-12: 18:00:00 0.25 mg via SUBCUTANEOUS

## 2019-08-12 MED ORDER — LACTATED RINGERS IV BOLUS
1000.0000 mL | Freq: Once | INTRAVENOUS | Status: AC
  Administered 2019-08-12: 1000 mL via INTRAVENOUS

## 2019-08-12 NOTE — OB Triage Note (Signed)
    L&D OB Triage Note  SUBJECTIVE Brenda Fuentes is a 23 y.o. G45P0101 female at [redacted]w[redacted]d, EDD Estimated Date of Delivery: 09/10/19 who presented to triage with complaints of contractions in her lower back and abdomen. She denies LOF or vaginal bleeding and feels good fetal movement.   OB History  Gravida Para Term Preterm AB Living  2 1 0 1 0 1  SAB TAB Ectopic Multiple Live Births  0 0 0 0 1    # Outcome Date GA Lbr Len/2nd Weight Sex Delivery Anes PTL Lv  2 Current           1 Preterm 07/04/16 [redacted]w[redacted]d 14:25 / 00:23 2560 g M Vag-Spont EPI  LIV     Name: ATKINS,BOY Lilee     Apgar1: 8  Apgar5: 9    Medications Prior to Admission  Medication Sig Dispense Refill Last Dose  . citalopram (CELEXA) 40 MG tablet Take 1 tablet (40 mg total) by mouth daily. 30 tablet 2   . Doxylamine-Pyridoxine (DICLEGIS) 10-10 MG TBEC Take 10 mg by mouth as needed.     Marland Kitchen omeprazole (PRILOSEC) 20 MG capsule Take 20 mg by mouth as needed.     . Prenatal Vit-Fe Fumarate-FA (PRENATAL MULTIVITAMIN) TABS tablet Take 1 tablet by mouth daily at 12 noon.        OBJECTIVE  Nursing Evaluation:   BP 137/82 (BP Location: Right Arm)   Pulse (!) 107   Temp 98.4 F (36.9 C) (Oral)   Resp 14   Wt 94.8 kg   LMP 12/04/2018   BMI 33.73 kg/m    Findings:   Preterm contractions  NST was performed and has been reviewed by me.  NST INTERPRETATION: Category I  Mode: External Baseline Rate (A): 140 bpm Variability: Moderate Accelerations: 15 x 15 Decelerations: None     Contractions: occasional   ASSESSMENT Impression:  1.  Pregnancy:  G2P0101 at [redacted]w[redacted]d , EDD Estimated Date of Delivery: 09/10/19 2.  NST:  Category I  3. Preterm contractions that resolved with IV hydration and terbutaline  PLAN 1. Reassurance given 2. Discharge home with standard labor precautions given to return to L&D or call the office for problems. 3. Continue routine prenatal care.    Philip Aspen, CNM

## 2019-08-12 NOTE — OB Triage Note (Signed)
Pt c/o a pain in her lower back and lower abdomen that started about an hour ago and it is constant. No LOF, No vaginal bleeding, she still feels the baby move.

## 2019-08-13 ENCOUNTER — Ambulatory Visit: Payer: Medicaid Other | Admitting: Licensed Clinical Social Worker

## 2019-08-13 ENCOUNTER — Observation Stay
Admission: EM | Admit: 2019-08-13 | Discharge: 2019-08-14 | Disposition: A | Discharge status: Home / Self Care | Payer: Medicaid Other | Attending: Certified Nurse Midwife | Admitting: Certified Nurse Midwife

## 2019-08-13 DIAGNOSIS — F419 Anxiety disorder, unspecified: Secondary | ICD-10-CM

## 2019-08-13 DIAGNOSIS — Z3A36 36 weeks gestation of pregnancy: Secondary | ICD-10-CM | POA: Diagnosis not present

## 2019-08-13 DIAGNOSIS — O99210 Obesity complicating pregnancy, unspecified trimester: Secondary | ICD-10-CM

## 2019-08-13 DIAGNOSIS — F331 Major depressive disorder, recurrent, moderate: Secondary | ICD-10-CM

## 2019-08-13 DIAGNOSIS — F411 Generalized anxiety disorder: Secondary | ICD-10-CM

## 2019-08-13 DIAGNOSIS — F329 Major depressive disorder, single episode, unspecified: Secondary | ICD-10-CM

## 2019-08-13 MED ORDER — TERBUTALINE SULFATE 1 MG/ML IJ SOLN
0.2500 mg | Freq: Once | INTRAMUSCULAR | Status: AC
  Administered 2019-08-13: 22:00:00 0.25 mg via SUBCUTANEOUS
  Filled 2019-08-13: qty 1

## 2019-08-13 MED ORDER — LIDOCAINE HCL (PF) 1 % IJ SOLN: 30.0000 mL | INTRAMUSCULAR | Status: DC | PRN

## 2019-08-13 MED ORDER — ONDANSETRON HCL 4 MG/2ML IJ SOLN
4.0000 mg | Freq: Four times a day (QID) | INTRAMUSCULAR | Status: DC
  Administered 2019-08-13: 4 mg via INTRAVENOUS
  Filled 2019-08-13: qty 2

## 2019-08-13 MED ORDER — BUTORPHANOL TARTRATE 1 MG/ML IJ SOLN
1.0000 mg | Freq: Once | INTRAMUSCULAR | Status: AC
  Administered 2019-08-14: 1 mg via INTRAVENOUS
  Filled 2019-08-13: qty 1

## 2019-08-13 MED ORDER — LACTATED RINGERS IV SOLN
500.0000 mL | INTRAVENOUS | Status: DC | PRN
  Administered 2019-08-13: 1000 mL via INTRAVENOUS

## 2019-08-13 MED ORDER — LACTATED RINGERS IV SOLN
INTRAVENOUS | Status: DC
  Administered 2019-08-13: 23:00:00 via INTRAVENOUS

## 2019-08-13 MED ORDER — ACETAMINOPHEN 325 MG PO TABS: 650.0000 mg | ORAL_TABLET | ORAL | Status: DC | PRN

## 2019-08-13 NOTE — OB Triage Note (Signed)
Pt presents to unit c/o ctx that started around 1900 this afternoon and are coming every 2-3 min. Pt reports coming to hospital yesterday for preterm labor as well. Pt reports positive FM, denies vaginal bleeding and LOF. Pt also denies sexual intercourse in the past 24 hours. Initial FHT 155, vital signs WDL. Will continue to monitor.

## 2019-08-13 NOTE — Progress Notes (Signed)
Counselor/Therapist Progress Note  Patient ID: Brenda Fuentes, MRN: [id number],    Date: 08/13/2019  Time Spent: 45 minutes   Treatment Type: Psychotherapy  Reported Symptoms: anxious thoughts, low mood, frustrations   Mental Status Exam:  Appearance:   NA     Behavior:  Appropriate and Sharing  Motor:  NA  Speech/Language:   Normal Rate  Affect:  NA  Mood:  sad  Thought process:  normal  Thought content:    WNL  Sensory/Perceptual disturbances:    WNL  Orientation:  oriented to person, place and time/date  Attention:  Good  Concentration:  Good  Memory:  WNL  Fund of knowledge:   Good  Insight:    Good  Judgment:   Good  Impulse Control:  Good   Risk Assessment: Danger to Self:  No Self-injurious Behavior: No Danger to Others: No Duty to Warn:no Physical Aggression / Violence:No  Access to Firearms a concern: No  Gang Involvement:No   Subjective: Patient was engaged and cooperative throughout the session using time effectively to discuss thoughts and feelings. Patient continues to express concerns of anxiety and depression. She reports feeling overwhelmed by daily tasks. Patient is likely to benefit from future treatment because she remains motivated to decrease symptoms and improve functioning.       Interventions: Cognitive Behavioral Therapy  Established psychological safety. Engaged patient in processing current psychosocial stressors - continued anxiety/depression symptoms due to multiple stressors and impending delivery. Checked in with patient regarding previous session and school. Assisted patient in identifying and prioritizing most pressing tasks, discussed options on how to best complete these tasks. Explored patient's perception of being judged by family, highlighting these thoughts and using cognitive reframeing. Provided support through active listening, validation of feelings, and highlighted patient's strengths.    Diagnosis:   ICD-10-CM   1.  Generalized anxiety disorder  F41.1   2. Major depressive disorder, recurrent episode, moderate (HCC)  F33.1     Plan: Continue use of CBTs assisting patient with cognitive distortions and skill building, including problem solving. Patient is encouraged to continue medication management.   Interpreter used: NA   Milton Ferguson, LCSW

## 2019-08-14 DIAGNOSIS — F419 Anxiety disorder, unspecified: Secondary | ICD-10-CM

## 2019-08-14 DIAGNOSIS — Z3A36 36 weeks gestation of pregnancy: Secondary | ICD-10-CM

## 2019-08-14 DIAGNOSIS — O4703 False labor before 37 completed weeks of gestation, third trimester: Secondary | ICD-10-CM

## 2019-08-14 DIAGNOSIS — O99343 Other mental disorders complicating pregnancy, third trimester: Secondary | ICD-10-CM | POA: Diagnosis not present

## 2019-08-14 DIAGNOSIS — F329 Major depressive disorder, single episode, unspecified: Secondary | ICD-10-CM

## 2019-08-14 NOTE — OB Triage Note (Signed)
    L&D OB Triage Note  SUBJECTIVE Brenda Fuentes is a 23 y.o. G25P0101 female at [redacted]w[redacted]d, EDD Estimated Date of Delivery: 09/10/19 who presented to triage with complaints of contractions. Denies LOF or vaginal bleeding. Feels good movement.   OB History  Gravida Para Term Preterm AB Living  2 1 0 1 0 1  SAB TAB Ectopic Multiple Live Births  0 0 0 0 1    # Outcome Date GA Lbr Len/2nd Weight Sex Delivery Anes PTL Lv  2 Current           1 Preterm 07/04/16 [redacted]w[redacted]d 14:25 / 00:23 2560 g M Vag-Spont EPI  LIV     Name: ATKINS,BOY Kaysia     Apgar1: 8  Apgar5: 9    Medications Prior to Admission  Medication Sig Dispense Refill Last Dose  . citalopram (CELEXA) 40 MG tablet Take 1 tablet (40 mg total) by mouth daily. 30 tablet 2   . Doxylamine-Pyridoxine (DICLEGIS) 10-10 MG TBEC Take 10 mg by mouth as needed.     Marland Kitchen omeprazole (PRILOSEC) 20 MG capsule Take 20 mg by mouth as needed.     . Prenatal Vit-Fe Fumarate-FA (PRENATAL MULTIVITAMIN) TABS tablet Take 1 tablet by mouth daily at 12 noon.        OBJECTIVE  Nursing Evaluation:   BP 133/80   Pulse (!) 102   Temp 98.4 F (36.9 C) (Oral)   Resp 18   LMP 12/04/2018    Findings:  False labor  NST was performed and has been reviewed by me.  NST INTERPRETATION: Category I  Mode: External Baseline Rate (A): 135 bpm(fht) Variability: Moderate Accelerations: 15 x 15 Decelerations: None     Contraction Frequency (min): occasional  ASSESSMENT Impression:  1.  Pregnancy:  G2P0101 at [redacted]w[redacted]d , EDD Estimated Date of Delivery: 09/10/19 2.  NST:  Category I 3. No cervical change in 4 hrs.   PLAN 1. Reassurance given 2. Discharge home with standard labor precautions given to return to L&D or call the office for problems. 3. Continue routine prenatal care.    Philip Aspen, CNM

## 2019-08-15 ENCOUNTER — Ambulatory Visit: Payer: Medicaid Other

## 2019-08-16 ENCOUNTER — Other Ambulatory Visit: Payer: Self-pay

## 2019-08-16 ENCOUNTER — Encounter: Payer: Medicaid Other | Admitting: Certified Nurse Midwife

## 2019-08-16 ENCOUNTER — Observation Stay
Admission: EM | Admit: 2019-08-16 | Discharge: 2019-08-16 | Disposition: A | Discharge status: Home / Self Care | Payer: Medicaid Other | Attending: Certified Nurse Midwife | Admitting: Certified Nurse Midwife

## 2019-08-16 DIAGNOSIS — O4703 False labor before 37 completed weeks of gestation, third trimester: Secondary | ICD-10-CM | POA: Diagnosis not present

## 2019-08-16 DIAGNOSIS — Z79899 Other long term (current) drug therapy: Secondary | ICD-10-CM | POA: Insufficient documentation

## 2019-08-16 DIAGNOSIS — Z3A36 36 weeks gestation of pregnancy: Secondary | ICD-10-CM | POA: Diagnosis not present

## 2019-08-16 NOTE — Progress Notes (Signed)
Spoke with [employer] CNM. Update given on vaginal exam, uterine activity, reactive NST, patient pain level, and patient history of vaginal delivery at 36 weeks with G1.  Orders received for patient to PO hydrate and ambulate in hallways if she wishes.  Per CNM patient may be off EFM while she ambulates.

## 2019-08-16 NOTE — Progress Notes (Signed)
Spoke with A.Grandville Silos, CNM.  Update given.  Cervical recheck remains unchanged from this am assessment at 0650.  Contractions continue to be every 2-4 minutes and lasting 50-70 seconds, they palpate mild.  FHR baseline is 135-140 with 15x15 accelerations present and no decelerations noted.  Orders received to discharge patient home with labor precautions.

## 2019-08-16 NOTE — OB Triage Note (Signed)
Patient came in for observation for preterm labor evaluation. Patient reports uterine contractions every five minutes since 0100. Patient denies leaking of fluid and denies vaginal bleeding and spotting. Vital signs stable and patient afebrile. Patient reports + FM. FHR baseline 140 with moderate variability with accelerations 15 x 15 and no decelerations. Significant other at bedside. Will continue to monitor.

## 2019-08-16 NOTE — OB Triage Note (Signed)
    L&D OB Triage Note  SUBJECTIVE Brenda Fuentes is a 23 y.o. G25P0101 female at [redacted]w[redacted]d, EDD Estimated Date of Delivery: 09/10/19 who presented to triage with complaints of regular contractions. She denies leaking of fluid, vaginal bleeding, and feels good movement.    OB History  Gravida Para Term Preterm AB Living  2 1 0 1 0 1  SAB TAB Ectopic Multiple Live Births  0 0 0 0 1    # Outcome Date GA Lbr Len/2nd Weight Sex Delivery Anes PTL Lv  2 Current           1 Preterm 07/04/16 [redacted]w[redacted]d 14:25 / 00:23 2560 g M Vag-Spont EPI  LIV     Name: Brenda Fuentes     Apgar1: 8  Apgar5: 9    Medications Prior to Admission  Medication Sig Dispense Refill Last Dose  . citalopram (CELEXA) 40 MG tablet Take 1 tablet (40 mg total) by mouth daily. 30 tablet 2 Past Week at Unknown time  . omeprazole (PRILOSEC) 20 MG capsule Take 20 mg by mouth as needed.   Past Week at Unknown time  . Prenatal Vit-Fe Fumarate-FA (PRENATAL MULTIVITAMIN) TABS tablet Take 1 tablet by mouth daily at 12 noon.   Past Week at Unknown time  . Doxylamine-Pyridoxine (DICLEGIS) 10-10 MG TBEC Take 10 mg by mouth as needed.   Not Taking at Unknown time     OBJECTIVE  Nursing Evaluation:   BP 132/83   Pulse (!) 114   Temp 98.3 F (36.8 C) (Oral)   Resp 18   Ht 5\' 6"  (1.676 m)   Wt 94.8 kg   LMP 12/04/2018   BMI 33.73 kg/m    Findings:  False labor, no cervical change  NST was performed and has been reviewed by me.  NST INTERPRETATION: Category I  Mode: External Baseline Rate (A): 145 bpm Variability: Moderate Accelerations: 15 x 15 Decelerations: None     Contraction Frequency (min): 2-4  ASSESSMENT Impression:  1.  Pregnancy:  G2P0101 at [redacted]w[redacted]d , EDD Estimated Date of Delivery: 09/10/19 2.  NST:  Category I  PLAN 1. Reassurance given 2. Discharge home with standard labor precautions given to return to L&D or call the office for problems. 3. Continue routine prenatal care.    Philip Aspen, CNM

## 2019-08-16 NOTE — Discharge Summary (Signed)
Discharge instructions reviewed with patient and significant other who both verbalized understanding.  Labor precautions reviewed.  Patient denies any further questions at this time.  Patient stable and ambulatory. Discharged home with significant other.

## 2019-08-20 ENCOUNTER — Inpatient Hospital Stay
Admission: EM | Admit: 2019-08-20 | Discharge: 2019-08-22 | DRG: 807 | Disposition: A | Discharge status: Home / Self Care | Payer: Medicaid Other | Attending: Obstetrics and Gynecology | Admitting: Obstetrics and Gynecology

## 2019-08-20 ENCOUNTER — Inpatient Hospital Stay: Payer: Medicaid Other | Admitting: Certified Registered"

## 2019-08-20 ENCOUNTER — Other Ambulatory Visit: Payer: Self-pay

## 2019-08-20 DIAGNOSIS — F329 Major depressive disorder, single episode, unspecified: Secondary | ICD-10-CM | POA: Diagnosis present

## 2019-08-20 DIAGNOSIS — E669 Obesity, unspecified: Secondary | ICD-10-CM | POA: Diagnosis present

## 2019-08-20 DIAGNOSIS — F419 Anxiety disorder, unspecified: Secondary | ICD-10-CM | POA: Diagnosis present

## 2019-08-20 DIAGNOSIS — Z87891 Personal history of nicotine dependence: Secondary | ICD-10-CM | POA: Diagnosis not present

## 2019-08-20 DIAGNOSIS — O99344 Other mental disorders complicating childbirth: Secondary | ICD-10-CM | POA: Diagnosis present

## 2019-08-20 DIAGNOSIS — O4202 Full-term premature rupture of membranes, onset of labor within 24 hours of rupture: Secondary | ICD-10-CM

## 2019-08-20 DIAGNOSIS — O99214 Obesity complicating childbirth: Secondary | ICD-10-CM | POA: Diagnosis present

## 2019-08-20 DIAGNOSIS — Z3A37 37 weeks gestation of pregnancy: Secondary | ICD-10-CM

## 2019-08-20 DIAGNOSIS — O26893 Other specified pregnancy related conditions, third trimester: Secondary | ICD-10-CM | POA: Diagnosis present

## 2019-08-20 DIAGNOSIS — Z880 Allergy status to penicillin: Secondary | ICD-10-CM | POA: Diagnosis not present

## 2019-08-20 DIAGNOSIS — O99210 Obesity complicating pregnancy, unspecified trimester: Secondary | ICD-10-CM

## 2019-08-20 LAB — CBC
HCT: 35.2 % — ABNORMAL LOW (ref 36.0–46.0)
Hemoglobin: 11.2 g/dL — ABNORMAL LOW (ref 12.0–15.0)
MCH: 25.2 pg — ABNORMAL LOW (ref 26.0–34.0)
MCHC: 31.8 g/dL (ref 30.0–36.0)
MCV: 79.1 fL — ABNORMAL LOW (ref 80.0–100.0)
Platelets: 236 10*3/uL (ref 150–400)
RBC: 4.45 MIL/uL (ref 3.87–5.11)
RDW: 14.9 % (ref 11.5–15.5)
WBC: 11.1 10*3/uL — ABNORMAL HIGH (ref 4.0–10.5)
nRBC: 0 % (ref 0.0–0.2)

## 2019-08-20 LAB — RUPTURE OF MEMBRANE (ROM)PLUS: Rom Plus: POSITIVE

## 2019-08-20 LAB — TYPE AND SCREEN
ABO/RH(D): B POS
Antibody Screen: NEGATIVE

## 2019-08-20 LAB — SARS CORONAVIRUS 2 BY RT PCR (HOSPITAL ORDER, PERFORMED IN ~~LOC~~ HOSPITAL LAB): SARS Coronavirus 2: NEGATIVE

## 2019-08-20 MED ORDER — DOCUSATE SODIUM 100 MG PO CAPS
100.0000 mg | ORAL_CAPSULE | Freq: Two times a day (BID) | ORAL | Status: DC
  Administered 2019-08-20 – 2019-08-22 (×4): 100 mg via ORAL
  Filled 2019-08-20 (×4): qty 1

## 2019-08-20 MED ORDER — ACETAMINOPHEN 325 MG PO TABS
650.0000 mg | ORAL_TABLET | ORAL | Status: DC | PRN
  Administered 2019-08-20 – 2019-08-21 (×3): 650 mg via ORAL
  Filled 2019-08-20 (×3): qty 2

## 2019-08-20 MED ORDER — ONDANSETRON HCL 4 MG/2ML IJ SOLN: 4.0000 mg | INTRAMUSCULAR | Status: DC | PRN

## 2019-08-20 MED ORDER — WITCH HAZEL-GLYCERIN EX PADS: 1.0000 "application " | MEDICATED_PAD | CUTANEOUS | Status: DC | PRN

## 2019-08-20 MED ORDER — OXYTOCIN BOLUS FROM INFUSION
500.0000 mL | Freq: Once | INTRAVENOUS | Status: AC
  Administered 2019-08-20: 500 mL via INTRAVENOUS

## 2019-08-20 MED ORDER — LACTATED RINGERS IV SOLN
INTRAVENOUS | Status: DC
  Administered 2019-08-20: 08:00:00 via INTRAVENOUS

## 2019-08-20 MED ORDER — SOD CITRATE-CITRIC ACID 500-334 MG/5ML PO SOLN: 30.0000 mL | ORAL | Status: DC | PRN

## 2019-08-20 MED ORDER — FENTANYL CITRATE (PF) 100 MCG/2ML IJ SOLN
50.0000 ug | INTRAMUSCULAR | Status: DC | PRN
  Administered 2019-08-20 (×2): 50 ug via INTRAVENOUS

## 2019-08-20 MED ORDER — FENTANYL 2.5 MCG/ML W/ROPIVACAINE 0.15% IN NS 100 ML EPIDURAL (ARMC)
EPIDURAL | Status: DC | PRN
  Administered 2019-08-20: 12 mL/h via EPIDURAL

## 2019-08-20 MED ORDER — COCONUT OIL OIL
1.0000 "application " | TOPICAL_OIL | Status: DC | PRN
  Filled 2019-08-20: qty 120

## 2019-08-20 MED ORDER — MISOPROSTOL 200 MCG PO TABS
ORAL_TABLET | ORAL | Status: AC
  Filled 2019-08-20: qty 4

## 2019-08-20 MED ORDER — IBUPROFEN 600 MG PO TABS
600.0000 mg | ORAL_TABLET | Freq: Four times a day (QID) | ORAL | Status: DC
  Administered 2019-08-20 – 2019-08-22 (×8): 600 mg via ORAL
  Filled 2019-08-20 (×11): qty 1

## 2019-08-20 MED ORDER — FENTANYL 2.5 MCG/ML W/ROPIVACAINE 0.15% IN NS 100 ML EPIDURAL (ARMC)
EPIDURAL | Status: AC
  Filled 2019-08-20: qty 100

## 2019-08-20 MED ORDER — OXYTOCIN 40 UNITS IN NORMAL SALINE INFUSION - SIMPLE MED
2.5000 [IU]/h | INTRAVENOUS | Status: DC
  Filled 2019-08-20 (×2): qty 1000

## 2019-08-20 MED ORDER — LACTATED RINGERS IV SOLN: INTRAVENOUS | Status: DC

## 2019-08-20 MED ORDER — LIDOCAINE HCL (PF) 1 % IJ SOLN
INTRAMUSCULAR | Status: DC | PRN
  Administered 2019-08-20: 3 mL

## 2019-08-20 MED ORDER — CITALOPRAM HYDROBROMIDE 20 MG PO TABS
40.0000 mg | ORAL_TABLET | Freq: Every day | ORAL | Status: DC
  Administered 2019-08-20 – 2019-08-22 (×3): 40 mg via ORAL
  Filled 2019-08-20 (×3): qty 2

## 2019-08-20 MED ORDER — ACETAMINOPHEN 325 MG PO TABS: 650.0000 mg | ORAL_TABLET | ORAL | Status: DC | PRN

## 2019-08-20 MED ORDER — VARICELLA VIRUS VACCINE LIVE 1350 PFU/0.5ML IJ SUSR
0.5000 mL | Freq: Once | INTRAMUSCULAR | Status: AC
  Administered 2019-08-22: 0.5 mL via SUBCUTANEOUS
  Filled 2019-08-20 (×2): qty 0.5

## 2019-08-20 MED ORDER — ONDANSETRON HCL 4 MG PO TABS: 4.0000 mg | ORAL_TABLET | ORAL | Status: DC | PRN

## 2019-08-20 MED ORDER — LIDOCAINE HCL (PF) 1 % IJ SOLN
30.0000 mL | INTRAMUSCULAR | Status: DC | PRN
  Filled 2019-08-20: qty 30

## 2019-08-20 MED ORDER — PRENATAL MULTIVITAMIN CH
1.0000 | ORAL_TABLET | Freq: Every day | ORAL | Status: DC
  Administered 2019-08-20 – 2019-08-21 (×2): 1 via ORAL
  Filled 2019-08-20 (×2): qty 1

## 2019-08-20 MED ORDER — SIMETHICONE 80 MG PO CHEW: 80.0000 mg | CHEWABLE_TABLET | ORAL | Status: DC | PRN

## 2019-08-20 MED ORDER — SODIUM CHLORIDE 0.9 % IV SOLN
INTRAVENOUS | Status: DC | PRN
  Administered 2019-08-20 (×2): 5 mL via EPIDURAL
  Administered 2019-08-20: 3 mL via EPIDURAL

## 2019-08-20 MED ORDER — DIPHENHYDRAMINE HCL 25 MG PO CAPS: 25.0000 mg | ORAL_CAPSULE | Freq: Four times a day (QID) | ORAL | Status: DC | PRN

## 2019-08-20 MED ORDER — ONDANSETRON HCL 4 MG/2ML IJ SOLN: 4.0000 mg | Freq: Four times a day (QID) | INTRAMUSCULAR | Status: DC | PRN

## 2019-08-20 MED ORDER — DIBUCAINE (PERIANAL) 1 % EX OINT: 1.0000 "application " | TOPICAL_OINTMENT | CUTANEOUS | Status: DC | PRN

## 2019-08-20 MED ORDER — BENZOCAINE-MENTHOL 20-0.5 % EX AERO
1.0000 "application " | INHALATION_SPRAY | CUTANEOUS | Status: DC | PRN
  Administered 2019-08-21: 1 via TOPICAL
  Filled 2019-08-20: qty 56

## 2019-08-20 MED ORDER — LACTATED RINGERS IV SOLN
500.0000 mL | INTRAVENOUS | Status: DC | PRN
  Administered 2019-08-20: 500 mL via INTRAVENOUS

## 2019-08-20 MED ORDER — FENTANYL CITRATE (PF) 100 MCG/2ML IJ SOLN
INTRAMUSCULAR | Status: AC
  Administered 2019-08-20: 50 ug via INTRAVENOUS
  Filled 2019-08-20: qty 2

## 2019-08-20 NOTE — Anesthesia Preprocedure Evaluation (Signed)
Anesthesia Evaluation  Patient identified by MRN, date of birth, ID band Patient awake    Reviewed: Allergy & Precautions, H&P , NPO status , Patient's Chart, lab work & pertinent test results  Airway Mallampati: II  TM Distance: >3 FB     Dental  (+) Dental Advidsory Given   Pulmonary former smoker,           Cardiovascular hypertension,      Neuro/Psych Anxiety Depression negative neurological ROS     GI/Hepatic Neg liver ROS, GERD  ,  Endo/Other  negative endocrine ROS  Renal/GU negative Renal ROS  negative genitourinary   Musculoskeletal   Abdominal   Peds  Hematology negative hematology ROS (+)   Anesthesia Other Findings   Reproductive/Obstetrics (+) Pregnancy                             Anesthesia Physical Anesthesia Plan  ASA: III  Anesthesia Plan: Epidural   Post-op Pain Management:    Induction:   PONV Risk Score and Plan:   Airway Management Planned:   Additional Equipment:   Intra-op Plan:   Post-operative Plan:   Informed Consent: I have reviewed the patients History and Physical, chart, labs and discussed the procedure including the risks, benefits and alternatives for the proposed anesthesia with the patient or authorized representative who has indicated his/her understanding and acceptance.       Plan Discussed with: Anesthesiologist and CRNA  Anesthesia Plan Comments:         Anesthesia Quick Evaluation

## 2019-08-20 NOTE — Progress Notes (Signed)
HARLEEN FINEBERG is a 23 y.o. G2P0101 at [redacted]w[redacted]d by LMP admitted for active labor, rupture of membranes  Subjective: Screaming with contractions, states IV fentanyl not helping contractions, awaiting anesthesia for epidural.  Objective: BP (!) 137/96   Pulse 87   Temp 98.8 F (37.1 C) (Oral)   Resp 20   Ht 5\' 6"  (1.676 m)   Wt 94.8 kg   LMP 12/04/2018   BMI 33.73 kg/m  No intake/output data recorded. No intake/output data recorded.  FHT:  FHR: 148 bpm, variability: moderate,  accelerations:  Present,  decelerations:  Absent UC:   regular, every 2-3 minutes, moderate to palpation SVE:   Dilation: 5 Effacement (%): 80 Station: -1 Exam by:: Laurell Roof, RN  Labs: Lab Results  Component Value Date   WBC 11.1 (H) 08/20/2019   HGB 11.2 (L) 08/20/2019   HCT 35.2 (L) 08/20/2019   MCV 79.1 (L) 08/20/2019   PLT 236 08/20/2019    Assessment / Plan: Spontaneous labor, progressing normally  Labor: Progressing normally Preeclampsia:  labs stable Fetal Wellbeing:  Category I Pain Control:  IV pain meds I/D:  n/a Anticipated MOD:  NSVD  Tharon Kitch N Markeis Allman 08/20/2019, 8:47 AM

## 2019-08-20 NOTE — Lactation Note (Signed)
This note was copied from a baby's chart. Lactation Consultation Note  Patient Name: Brenda Fuentes [id number] Date: 08/20/2019 Reason for consult: Initial assessment   LC introduced to mom once moved to MB unit. Mom reports breastfeeding in L&D with baby Brenda Vita Erm, and that it went well. She plans to pursue breastfeeding, to "see how it goes".  She attempted breastfeeding with her first child, a boy, and reported numerous problems possibility related to him being born at 57 weeks. Mom is reporting that her nipples are sore and tender. Discussed transient nipple soreness, position and latch of baby at the breast, provided coconut oil, and comfort gels, explaining the care/use of each. Reviewed newborn feeding patterns, stomach size, and early hunger cues. Encouraged mom to put baby to the breast at the earliest sign to promote a good latch.    Maternal Data Formula Feeding for Exclusion: No Has patient been taught Hand Expression?: Yes Does the patient have breastfeeding experience prior to this delivery?: Yes  Feeding Feeding Type: Breast Fed  LATCH Score                   Interventions Interventions: Coconut oil;Comfort gels  Lactation Tools Discussed/Used     Consult Status Consult Status: Follow-up Date: 08/20/19 Follow-up type: In-patient    Lavonia Drafts 08/20/2019, 3:58 PM

## 2019-08-20 NOTE — OB Triage Note (Signed)
Pt reports to unit c/o of leakage of clear fluid that began around 0545 this am. Per pt, ctx started coming soon after. Pt reports positive fetal movement, denies vaginal bleeding. Initial FHT 145. Vital signs WDL. Will continue to monitor.

## 2019-08-20 NOTE — Progress Notes (Signed)
Brenda Fuentes is a 23 y.o. G2P0101 at [redacted]w[redacted]d by LMP admitted for active labor, rupture of membranes  Subjective: Reports rectal pressure with conttractions since epidural placed, but no pain  Objective: BP 137/87 (BP Location: Right Arm)   Pulse 85   Temp 98.8 F (37.1 C) (Oral)   Resp 20   Ht 5\' 6"  (1.676 m)   Wt 94.8 kg   LMP 12/04/2018   SpO2 100%   BMI 33.73 kg/m  No intake/output data recorded. No intake/output data recorded.  FHT:  FHR: 148 bpm, variability: moderate,  accelerations:  Present,  decelerations:  Present early UC:   regular, every 3-4 minutes SVE:   Dilation: 9 Effacement (%): 100 Station: -1 Exam by:: [employer], CNM  Labs: Lab Results  Component Value Date   WBC 11.1 (H) 08/20/2019   HGB 11.2 (L) 08/20/2019   HCT 35.2 (L) 08/20/2019   MCV 79.1 (L) 08/20/2019   PLT 236 08/20/2019    Assessment / Plan: Spontaneous labor, progressing normally  Labor: Progressing normally Preeclampsia:  labs stable Fetal Wellbeing:  Category I Pain Control:  Epidural I/D:  n/a Anticipated MOD:  NSVD  Melody N Shambley 08/20/2019, 9:54 AM

## 2019-08-20 NOTE — Anesthesia Procedure Notes (Addendum)
Epidural Patient location during procedure: OB Start time: 08/20/2019 9:27 AM End time: 08/20/2019 9:34 AM  Staffing Anesthesiologist: Emmie Niemann, MD Resident/CRNA: Jerrye Noble, CRNA Performed: resident/CRNA   Preanesthetic Checklist Completed: patient identified, site marked, surgical consent, pre-op evaluation, timeout performed, IV checked, risks and benefits discussed and monitors and equipment checked  Epidural Patient position: sitting Prep: ChloraPrep Patient monitoring: continuous pulse ox and blood pressure Approach: midline Location: L3-L4 Injection technique: LOR saline  Needle:  Needle type: Tuohy  Needle gauge: 17 G Needle length: 9 cm Needle insertion depth: 8 cm Catheter size: 19 Gauge Catheter at skin depth: 13 cm Test dose: negative and Other  Assessment Sensory level: T10  Additional Notes Reason for block:procedure for pain

## 2019-08-20 NOTE — H&P (Signed)
Obstetric History and Physical  Brenda Fuentes is a 23 y.o. G2P0101 with IUP at [redacted]w[redacted]d presenting with complaints of leaking fluid and contractions. Patient states she has been having  irregular, every 3-5 minutes contractions, none vaginal bleeding, ruptured, clear fluid membranes, with active fetal movement.    Prenatal Course Source [facility] Care: [facility]  Pregnancy complications or risks:BMI >30, H/O PTD at 36 weeks  Prenatal labs and studies: ABO, Rh: B/Positive/-- (02/24 4944) Antibody: Negative (02/24 0923) Rubella: 1.38 (02/24 0923) RPR: Non Reactive (07/09 0932)  HBsAg: Negative (02/24 9675)  HIV: Non Reactive (02/24 0923)  GBS: negative 1 hr Glucola  normal Genetic screening normal Anatomy US normal  Past Medical History:  Diagnosis Date  . Anxiety   . Depression 2016  . Gestational hypertension   . Hypercholesteremia 2009    Past Surgical History:  Procedure Laterality Date  . NO PAST SURGERIES      OB History  Gravida Para Term Preterm AB Living  2 1   1   1   SAB TAB Ectopic Multiple Live Births        0 1    # Outcome Date GA Lbr Len/2nd Weight Sex Delivery Anes PTL Lv  2 Current           1 Preterm 07/04/16 [redacted]w[redacted]d 14:25 / 00:23 2560 g M Vag-Spont EPI  LIV    Social History   Socioeconomic History  . Marital status: Married    Spouse name: Not on file  . Number of children: Not on file  . Years of education: Not on file  . Highest education level: Not on file  Occupational History  . Not on file  Social Needs  . Financial resource strain: Not on file  . Food insecurity    Worry: Not on file    Inability: Not on file  . Transportation needs    Medical: Not on file    Non-medical: Not on file  Tobacco Use  . Smoking status: Former Smoker    Packs/day: 0.50  . Smokeless tobacco: Never Used  Substance and Sexual Activity  . Alcohol use: No  . Drug use: No  . Sexual activity: Yes    Birth control/protection: I.U.D.  Lifestyle  . Physical activity     Days per week: Not on file    Minutes per session: Not on file  . Stress: Not on file  Relationships  . Social Herbalist on phone: Not on file    Gets together: Not on file    Attends religious service: Not on file    Active member of club or organization: Not on file    Attends meetings of clubs or organizations: Not on file    Relationship status: Not on file  Other Topics Concern  . Not on file  Social History Narrative  . Not on file    Family History  Problem Relation Age of Onset  . Hyperlipidemia Mother   . Heart failure Father   . Diabetes Father     Medications Prior to Admission  Medication Sig Dispense Refill Last Dose  . citalopram (CELEXA) 40 MG tablet Take 1 tablet (40 mg total) by mouth daily. 30 tablet 2 08/19/2019 at Unknown time  . omeprazole (PRILOSEC) 20 MG capsule Take 20 mg by mouth as needed.   08/19/2019 at Unknown time  . Prenatal Vit-Fe Fumarate-FA (PRENATAL MULTIVITAMIN) TABS tablet Take 1 tablet by mouth daily at 12 noon.  08/19/2019 at Unknown time  . Doxylamine-Pyridoxine (DICLEGIS) 10-10 MG TBEC Take 10 mg by mouth as needed.       Allergies  Allergen Reactions  . Penicillins Hives    Review of Systems: Negative except for what is mentioned in HPI.  Physical Exam: BP (!) 137/96   Pulse 87   Temp 98.8 F (37.1 C) (Oral)   Resp 20   LMP 12/04/2018  GENERAL: Well-developed, well-nourished female in no acute distress.  LUNGS: Clear to auscultation bilaterally.  HEART: Regular rate and rhythm. ABDOMEN: Soft, nontender, nondistended, gravid. EXTREMITIES: Nontender, no edema, 2+ distal pulses. Cervical Exam: Dilation: 2 Effacement (%): 50, 60 Station: -2, -3 Exam by:: A Sherlon Handing RN  FHT:  Baseline rate 145 bpm   Variability moderate  Accelerations present   Decelerations none Contractions: Every 2-3 mins   Pertinent Labs/Studies:   Results for orders placed or performed during the hospital encounter of 08/20/19 (from the  past 24 hour(s))  ROM Plus ([facility] only)     Status: None   Collection Time: 08/20/19  6:51 AM  Result Value Ref Range   Rom Plus POSITIVE     Assessment : Brenda Fuentes is a 23 y.o. G2P0101 at [redacted]w[redacted]d being admitted for labor.  Plan: Labor: Expectant management.  Induction/Augmentation as needed, per protocol FWB: Reassuring fetal heart tracing.  GBS negative Delivery plan: Hopeful for vaginal delivery  Melody Shambley, CNM [facility]

## 2019-08-21 LAB — CBC
HCT: 31.7 % — ABNORMAL LOW (ref 36.0–46.0)
Hemoglobin: 9.9 g/dL — ABNORMAL LOW (ref 12.0–15.0)
MCH: 25.3 pg — ABNORMAL LOW (ref 26.0–34.0)
MCHC: 31.2 g/dL (ref 30.0–36.0)
MCV: 80.9 fL (ref 80.0–100.0)
Platelets: 228 10*3/uL (ref 150–400)
RBC: 3.92 MIL/uL (ref 3.87–5.11)
RDW: 15.2 % (ref 11.5–15.5)
WBC: 13 10*3/uL — ABNORMAL HIGH (ref 4.0–10.5)
nRBC: 0 % (ref 0.0–0.2)

## 2019-08-21 LAB — RPR: RPR Ser Ql: NONREACTIVE

## 2019-08-21 MED ORDER — IBUPROFEN 600 MG PO TABS: 600.0000 mg | ORAL_TABLET | Freq: Four times a day (QID) | ORAL | 0 refills | Status: DC

## 2019-08-21 MED ORDER — FERROUS SULFATE 325 (65 FE) MG PO TABS: 325.0000 mg | ORAL_TABLET | Freq: Two times a day (BID) | ORAL | 3 refills | Status: DC

## 2019-08-21 MED ORDER — FERROUS SULFATE 325 (65 FE) MG PO TABS
325.0000 mg | ORAL_TABLET | Freq: Two times a day (BID) | ORAL | Status: DC
  Administered 2019-08-21 – 2019-08-22 (×2): 325 mg via ORAL
  Filled 2019-08-21 (×2): qty 1

## 2019-08-21 NOTE — Discharge Summary (Addendum)
Obstetric Discharge Summary  Patient ID: Brenda Fuentes MRN: [id number] DOB/AGE: 1996-07-06 23 y.o.   Date of Admission: 08/20/2019  Date of Discharge:  08/22/2019  Admitting Diagnosis: Onset of Labor at [redacted]w[redacted]d  Secondary Diagnosis: Varicella non-immune, Anxiety and depression, Obesity in pregnancy, History of Gestational Hypertension, History of preterm birth  Mode of Delivery: Normal spontaneous vaginal delivery     Discharge Diagnosis: No other diagnosis   Intrapartum Procedures: epidural   Post partum procedures: None  Complications: None   Brief Hospital Course   Brenda Fuentes is a [id number] who had a SVD on 08/20/2019;  for further details of this birth, please refer to the delivey summary.  Patient had an uncomplicated postpartum course.  By time of discharge on PPD#2, her pain was controlled on oral pain medications; she had appropriate lochia and was ambulating, voiding without difficulty and tolerating regular diet.  She was deemed stable for discharge to home.    Labs: CBC Latest Ref Rng & Units 08/21/2019 08/20/2019 06/20/2019  WBC 4.0 - 10.5 K/uL 13.0(H) 11.1(H) 9.4  Hemoglobin 12.0 - 15.0 g/dL 9.9(L) 11.2(L) 11.0(L)  Hematocrit 36.0 - 46.0 % 31.7(L) 35.2(L) 33.4(L)  Platelets 150 - 400 K/uL 228 236 259   B POS  Physical exam:   Temp:  [98.4 F (36.9 C)-98.5 F (36.9 C)] 98.4 F (36.9 C) (09/10 0726) Pulse Rate:  [73-80] 74 (09/10 0726) Resp:  [16-20] 18 (09/10 0726) BP: (123-137)/(79-89) 123/80 (09/10 0726) SpO2:  [98 %-100 %] 100 % (09/10 0726)  General: alert and no distress  Lochia: appropriate  Abdomen: soft, NT  Uterine Fundus: firm  Extremities: No evidence of DVT seen on physical exam. No lower extremity edema.  Edinburgh Postnatal Depression Scale - 08/21/19 0000      Edinburgh Postnatal Depression Scale:  In the Past 7 Days   I have been able to laugh and see the funny side of things.  1    I have looked forward with enjoyment to  things.  1    I have blamed myself unnecessarily when things went wrong.  3    I have been anxious or worried for no good reason.  2    I have felt scared or panicky for no good reason.  2    Things have been getting on top of me.  2    I have been so unhappy that I have had difficulty sleeping.  1    I have felt sad or miserable.  1    I have been so unhappy that I have been crying.  1    The thought of harming myself has occurred to me.  1    Edinburgh Postnatal Depression Scale Total  15       Discharge Instructions: Per After Visit Summary.  Activity: Advance as tolerated. Pelvic rest for 6 weeks.  Also refer to After Visit Summary  Diet: Regular  Medications: Allergies as of 08/21/2019      Reactions   Penicillins Hives      Medication List    STOP taking these medications   Diclegis 10-10 MG Tbec Generic drug: Doxylamine-Pyridoxine     TAKE these medications   citalopram 40 MG tablet Commonly known as: CeleXA Take 1 tablet (40 mg total) by mouth daily.   ibuprofen 600 MG tablet Commonly known as: ADVIL Take 1 tablet (600 mg total) by mouth every 6 (six) hours.   omeprazole 20 MG capsule Commonly known as: PRILOSEC Take  20 mg by mouth as needed.   prenatal multivitamin Tabs tablet Take 1 tablet by mouth daily at 12 noon.      Outpatient follow up:  Follow-up Information    Newton, Melodye Ped, CNM. Call.   Specialties: Obstetrics and Gynecology, Radiology Why: Please call and schedule two (2) week mood check and six (6) week postpartum visit.  Contact information: [address] [place] [address] [phone]          Postpartum contraception: IUD, Paragard  Discharged Condition: stable  Discharged to: home   Newborn Data:  Disposition:home with mother  Apgars: APGAR (1 MIN): 9   APGAR (5 MINS): 9    Baby Feeding: Bottle and Breast   Gunnar Bulla, CNM [facility], [facility] 08/22/19 1:32 PM

## 2019-08-21 NOTE — Progress Notes (Signed)
Patient ID: Brenda Fuentes, female   DOB: 01-08-1996, 23 y.o.   MRN: [id number]  Post Partum Day # 1, s/p spontaneous vaginal birth, Rh positive, GBS negative, Varicella non-immune, Anxiety and depression, Obesity in pregnancy, History of Gestational Hypertension, History of preterm birth  Subjective:  Patient sitting in bed on telephone, holding infant. No questions or concerns.   Denies difficulty breathing or respiratory distress, chest pain, abdominal pain, excessive vaginal bleeding, dysuria, and leg pain or swelling.   Objective:  Temp:  [97.9 F (36.6 C)-98.6 F (37 C)] 98.3 F (36.8 C) (09/09 0818) Pulse Rate:  [66-100] 78 (09/09 0818) Resp:  [17-20] 18 (09/09 0818) BP: (126-147)/(70-88) 130/81 (09/09 0818) SpO2:  [98 %-100 %] 99 % (09/09 0818)  Physical Exam:   General: alert and cooperative   Lungs: clear to auscultation bilaterally  Breasts: deferred, no complaints  Heart: normal apical impulse  Abdomen: soft, non-tender; bowel sounds normal; no masses,  no organomegaly  Pelvis: Lochia: appropriate, Uterine Fundus: firm  Extremities: DVT Evaluation: No evidence of DVT seen on physical exam.  Recent Labs    08/20/19 0816 08/21/19 0630  HGB 11.2* 9.9*  HCT 35.2* 31.7*    Assessment:  23 year old female, G2P2, Post Partum Day # 1, s/p spontaneous vaginal birth, Rh positive, GBS negative, Varicella non-immune, Anxiety and depression, Obesity in pregnancy, History of Gestational Hypertension, History of preterm birth  Breastfeeding with formula   Plan:  Routine postpartum care and education.   Reviewed red flag symptoms and when to call.   Continue orders as written. Reassess as needed.   Anticipate discharge tomorrow.    LOS: 1 day   Diona Fanti, CNM [facility], [facility] 08/21/2019 11:44 AM

## 2019-08-21 NOTE — Lactation Note (Signed)
This note was copied from a baby's chart. Lactation Consultation Note  Patient Name: Brenda Fuentes [id number] Date: 08/21/2019 Reason for consult: Follow-up assessment;Difficult latch  LC in to check on mom. RN in the room and reported mom had finished feed but baby still appeared to be rooting. Mom voiced yesterday, she wanted to "try" breastfeeding, but today has become sore, and appears to have a bruise on her right breast. LC did the finger test to see how baby was sucking/latching. Baby [name] appeared to have a chomping motion, using more gums, than a sucking sensation. After a few minutes baby began to suck and LC felt the wave like motion of her tongue. LC tested tongue movement; tongue can move side to side, up/down, and extend beyond the gum line. Due to the chomping motion, LC provided a 40mm nipple shield. LC demonstrated how to place the shield onto the breast, discussed cleaning and care.  Baby [name] was able to grasp the breast easily with the nipple shield, displayed flanged top and bottom lip, and milk was transferred evident through audible swallows and left over milk in nipple shield. [name] fed for 10 minutes on left breast, and was then moved to right for an additional 12 minutes.  Post feed, she had a stool diaper, but was able to be calmed back down after changing, and appeared content/asleep.   Maternal Data Formula Feeding for Exclusion: No Has patient been taught Hand Expression?: Yes Does the patient have breastfeeding experience prior to this delivery?: Yes  Feeding Feeding Type: Breast Fed  LATCH Score Latch: Repeated attempts needed to sustain latch, nipple held in mouth throughout feeding, stimulation needed to elicit sucking reflex.  Audible Swallowing: Spontaneous and intermittent  Type of Nipple: Everted at rest and after stimulation  Comfort (Breast/Nipple): Soft / non-tender  Hold (Positioning): Assistance needed to correctly position infant at  breast and maintain latch.  LATCH Score: 8  Interventions Interventions: Breast feeding basics reviewed;Assisted with latch;Hand express;Adjust position;Support pillows;Position options  Lactation Tools Discussed/Used Tools: Nipple Shields Nipple shield size: 20   Consult Status Consult Status: Follow-up Date: 08/21/19 Follow-up type: In-patient    Lavonia Drafts 08/21/2019, 3:00 PM

## 2019-08-21 NOTE — Anesthesia Postprocedure Evaluation (Signed)
Anesthesia Post Note  Patient: Brenda Fuentes  Procedure(s) Performed: AN AD HOC LABOR EPIDURAL  Anesthesia Type: Epidural     Last Vitals:  Vitals:   08/20/19 2348 08/21/19 0315  BP: (!) 141/83 126/88  Pulse: 87 66  Resp: 20 17  Temp: 36.7 C 36.7 C  SpO2: 99% 99%    Last Pain:  Vitals:   08/21/19 0645  TempSrc:   PainSc: 0-No pain                 Veera Stapleton,  Clearnce Sorrel

## 2019-08-21 NOTE — Discharge Instructions (Signed)

## 2019-08-22 ENCOUNTER — Ambulatory Visit: Payer: Medicaid Other

## 2019-08-22 NOTE — Progress Notes (Signed)
DC home with baby.  To car via [place] with staff.  Pt verb u/o of f/u appointment in 2 wks for mood check and 6 wks f/u.

## 2019-08-22 NOTE — Clinical Social Work Maternal (Signed)
  CLINICAL SOCIAL WORK MATERNAL/CHILD NOTE  Patient Details  Name: Brenda Fuentes MRN: [id number] Date of Birth: [birth year]  Date:  08/22/2019  Clinical Social Worker Initiating Note:  Annamaria Boots Date/Time: Initiated:  08/22/19/0916     Child's Name:  Dorene Ar   Biological Parents:  Mother, Father   Need for Interpreter:  None   Reason for Referral:  Other (Comment)(Scored 15 on [place])   Address:  [address] [place] [address]    Phone number:  [phone] (home)     Additional phone number:   Household Members/Support Persons (HM/SP):   Household Member/Support Person 1   HM/SP Name Relationship DOB or Age  HM/SP -1 Jimmie Marcott Husband    HM/SP -2        HM/SP -3        HM/SP -4        HM/SP -5        HM/SP -6        HM/SP -7        HM/SP -8          Natural Supports (not living in the home):  Spouse/significant other   Professional Supports: None   Employment: Unemployed   Type of Work:     Education:  [place] arranged:    Museum/gallery curator Resources:  Medicaid   Other Resources:      Cultural/Religious Considerations Which May Impact Care:    Strengths:  Ability to meet basic needs , Home prepared for child    Psychotropic Medications:         Pediatrician:       Pediatrician List:   [place]      Pediatrician Fax Number:    Risk Factors/Current Problems:      Cognitive State:  Alert    Mood/Affect:  Calm    CSW Assessment: CSW consulted for Edinburgh score of 15. Patient also has a history of Anxiety/Depression. CSW met with patient to discuss concerns for depression. Patient is alert and oriented and bonding well with baby. Patient reports that this is her second child and she has a little boy as well. Patient also reports that she has had anxiety and depression for years. Patient unable to  give name of medical professional that helps her manage her depression and anxiety. Patient reports that she feels fine and does not have any thoughts of SI/HI. Patient reports that she has all needed items for baby to go home and has no physical needs. Patient currently  Has Medicaid and states that she will have Medicaid for baby as well. Patient reports that she knows the signs of post partum depression and what to look for. CSW reviewed these signs with patient. Patient also reports that her husband and family are aware of signs and can assist her as needed. Patient states that she does not need anything else and is excited to return home soon. CSW will sign off. Please re consult if further needs arise.   CSW Plan/Description:  No Further Intervention Required/No Barriers to Discharge    Annamaria Boots, Latanya Presser 08/22/2019, 9:18 AM

## 2019-08-22 NOTE — Progress Notes (Signed)
DC inst reviewed with pt.  Pt verb u/o of care for home.

## 2019-08-23 ENCOUNTER — Encounter: Payer: Medicaid Other | Admitting: Certified Nurse Midwife

## 2019-08-27 ENCOUNTER — Ambulatory Visit: Payer: Medicaid Other | Admitting: Licensed Clinical Social Worker

## 2019-09-03 ENCOUNTER — Ambulatory Visit: Payer: Medicaid Other | Admitting: Licensed Clinical Social Worker

## 2019-09-03 DIAGNOSIS — F411 Generalized anxiety disorder: Secondary | ICD-10-CM

## 2019-09-03 DIAGNOSIS — F331 Major depressive disorder, recurrent, moderate: Secondary | ICD-10-CM

## 2019-09-03 NOTE — Progress Notes (Signed)
Counselor/Therapist Progress Note  Patient ID: Brenda Fuentes, MRN: [id number],    Date: 09/03/2019  Time Spent: 45 minutes   Treatment Type: Psychotherapy  Reported Symptoms: Anhedonia, Fatigue, Physical aches and pain and overwhelm, irritablity, depressed mood  Mental Status Exam:   Appearance:   NA     Behavior:  Appropriate and Sharing  Motor:  Normal  Speech/Language:   Normal Rate  Affect:  NA  Mood:  depressed  Thought process:  normal  Thought content:    WNL  Sensory/Perceptual disturbances:    WNL  Orientation:  oriented to person, place and time/date  Attention:  Good  Concentration:  Good  Memory:  WNL  Fund of knowledge:   Good  Insight:    Good  Judgment:   Good  Impulse Control:  Good   Risk Assessment: Danger to Self:  No Self-injurious Behavior: No Danger to Others: No Duty to Warn:no Physical Aggression / Violence:No  Access to Firearms a concern: No  Gang Involvement:No   Subjective: Patient was engaged and cooperative throughout the session using time effectively to discuss thoughts and feelings.   Patient voices difficulties adjusting to having new baby while also managing parenting, school, and the house hold. She voices continued motivation for treatment and understanding of mood and anxiety symptoms. Patient is likely to benefit from future treatment because she remains motivated to decrease symptoms and improve functioning.      Interventions: Cognitive Behavioral Therapy  Established psychological safety. Assessed patient's symptoms and current psychosocial stressors. Explored patient's distress, identifying points of intervention. Provided Psychoeducation on postpartum mood and anxieety disorders. Discussed the importance of self-care, including sleep, eating regularly, and getting outside. Problem solved barriers. Provided support through active listening, validation of feelings, and highlighted patient's strengths.   Diagnosis:  ICD-10-CM   1. Generalized anxiety disorder  F41.1   2. Major depressive disorder, recurrent episode, moderate (HCC)  F33.1     Plan: Weekly sessions. Continue use of CBTs to address patient's depression and anxiety symptoms. Patient is encouraged to continue medication management.   Interpreter used: NA   Milton Ferguson, LCSW

## 2019-09-10 ENCOUNTER — Telehealth: Payer: Self-pay | Admitting: Licensed Clinical Social Worker

## 2019-09-10 ENCOUNTER — Ambulatory Visit: Payer: Medicaid Other | Admitting: Licensed Clinical Social Worker

## 2019-09-10 NOTE — Telephone Encounter (Signed)
Attempted two phone calls to patient for phone-based appointment. LCSW left two vm and on the second one encouraged patient to return call to reschedule missed appointments.

## 2019-09-18 ENCOUNTER — Ambulatory Visit: Payer: Medicaid Other | Admitting: Licensed Clinical Social Worker

## 2019-09-18 DIAGNOSIS — F411 Generalized anxiety disorder: Secondary | ICD-10-CM

## 2019-09-18 DIAGNOSIS — F331 Major depressive disorder, recurrent, moderate: Secondary | ICD-10-CM

## 2019-09-18 NOTE — Progress Notes (Signed)
Counselor/Therapist Progress Note  Patient ID: Brenda Fuentes, MRN: [id number],    Date: 09/18/2019  Time Spent: 45 minutes   Treatment Type: Psychotherapy  Reported Symptoms: Panic attacks and feeling of overwhelm; low mood   Mental Status Exam:  Appearance:   NA     Behavior:  Appropriate and Sharing  Motor:  NA  Speech/Language:   Normal Rate  Affect:  NA  Mood:  normal and low mood and overwhelmed  Thought process:  normal  Thought content:    WNL  Sensory/Perceptual disturbances:    WNL  Orientation:  oriented to person, place and time/date  Attention:  Good  Concentration:  Good  Memory:  WNL  Fund of knowledge:   Good  Insight:    Good  Judgment:   Good  Impulse Control:  Good   Risk Assessment: Danger to Self:  No Self-injurious Behavior: No Danger to Others: No Duty to Warn:no Physical Aggression / Violence:No  Access to Firearms a concern: No  Gang Involvement:No   Subjective: Patient was engaged and cooperative throughout the session using time effectively to discuss thoughts and feelings. Patient voices significant anxiety and feelings of overwhelm. She reports continued motivation for treatment and understanding of depression and anxiety. While she reports continued motivation for treatment she voices that she self discontinued Celexa while in the hospital - she agrees to re-start. She also agrees to follow up with prescriber regarding significant anxiety symptoms. Patient is likely to benefit from future treatment because she remains motivated to decrease symptoms and improve functioning.    Interventions: Cognitive Behavioral Therapy  Established psychological safety. Checked in with patient and assessed current symptoms and psychosocial stressors - significant anxiety due to multiple stressors. Provided tips on managing panic attacks, including activating the 5 senses. Engaged patient in processing marital stressors, validating patient's feelings of  overwhelm, and reframed unhelpful thoughts leading to distress. Encouraged patient to continue to seek positive peer support.  Discussed at length medication management and the importance of patient taking meds as prescribed. Provided support through active listening, validation of feelings, and highlighted patient's strengths.     Diagnosis:   ICD-10-CM   1. Generalized anxiety disorder  F41.1   2. Major depressive disorder, recurrent episode, moderate (HCC)  F33.1     Plan: Weekly sessions recomended. Continue use of CBTs to address patient's depression and anxiety symptoms.   Patient is encouraged to continue medication management and follow up with prescriber to discuss being evaluated for hydroxyzine for anxiety and daily panic attacks.   Future Appointments  Date Time Provider [place]  09/25/2019  4:30 PM Milton Ferguson, LCSW [facility] None  10/03/2019  3:30 PM Joylene Igo, CNM [facility] None  10/16/2019 10:30 AM Joylene Igo, CNM [facility] None    Interpreter used: NA   Milton Ferguson, LCSW

## 2019-09-25 ENCOUNTER — Ambulatory Visit: Payer: Medicaid Other | Admitting: Licensed Clinical Social Worker

## 2019-10-02 ENCOUNTER — Ambulatory Visit: Payer: Medicaid Other | Admitting: Licensed Clinical Social Worker

## 2019-10-02 DIAGNOSIS — F331 Major depressive disorder, recurrent, moderate: Secondary | ICD-10-CM

## 2019-10-02 DIAGNOSIS — F411 Generalized anxiety disorder: Secondary | ICD-10-CM

## 2019-10-02 NOTE — Progress Notes (Signed)
Counselor/Therapist Progress Note  Patient ID: Brenda Fuentes, MRN: [id number],    Date: 10/02/2019  Time Spent: 45 minutes    Treatment Type: Psychotherapy  Reported Symptoms: feelings of overwhelm leading to irritability- "snappy", anxiety, denies any panic attacks; low mood, mild anhedonia   Mental Status Exam:  Appearance:   NA     Behavior:  Appropriate and Sharing  Motor:  Normal  Speech/Language:   Normal Rate  Affect:  NA  Mood:  normal  Thought process:  normal  Thought content:    WNL  Sensory/Perceptual disturbances:    WNL  Orientation:  oriented to person, place and time/date  Attention:  Good  Concentration:  Good  Memory:  WNL  Fund of knowledge:   Good  Insight:    Good  Judgment:   Good  Impulse Control:  Good   Risk Assessment: Danger to Self:  No Self-injurious Behavior: No Danger to Others: No Duty to Warn:no Physical Aggression / Violence:No  Access to Firearms a concern: No  Gang Involvement:No   Subjective: Patient was engaged and cooperative throughout the session using time effectively to discuss thoughts and feelings. Patient voices continued motivation for treatment and understanding of mood and anxiety issues. Patient is likely to benefit from future treatment because she remains motivated to decrease symptoms and improve functioning. Patient in agreement with restarting medication.   Interventions: Cognitive Behavioral Therapy and Motivational Interviewing  Established psychological safety. Checked in with patient regarding symptoms and current psychosocial stressors- continued anxiety symptoms, decrease in depression symptoms, and denies panic attacks. Used MI to explore non-compliance with medication management, provided education on benefits of medication management, assisted patient in resolving ambivalence and developing a plan to take medication daily. Explored patient's perception of difficulties within marriage, brainstorming options  for patient to ask for help/support weighing the pros and cons. Provided support through active listening, validation of feelings, and highlighted patient's strengths.   Diagnosis:   ICD-10-CM   1. Generalized anxiety disorder  F41.1   2. Major depressive disorder, recurrent episode, moderate (HCC)  F33.1     Plan: Weekly sessions recomended.Continue use of CBTsto address patient's depression and anxiety symptoms. Patient is encouraged to continue medication management.  Interpreter used: NA  Milton Ferguson, LCSW

## 2019-10-03 ENCOUNTER — Other Ambulatory Visit: Payer: Self-pay

## 2019-10-03 ENCOUNTER — Encounter: Payer: Self-pay | Admitting: Obstetrics and Gynecology

## 2019-10-03 ENCOUNTER — Ambulatory Visit (INDEPENDENT_AMBULATORY_CARE_PROVIDER_SITE_OTHER): Payer: Medicaid Other | Admitting: Obstetrics and Gynecology

## 2019-10-03 DIAGNOSIS — O99345 Other mental disorders complicating the puerperium: Secondary | ICD-10-CM

## 2019-10-03 DIAGNOSIS — Z1389 Encounter for screening for other disorder: Secondary | ICD-10-CM | POA: Diagnosis not present

## 2019-10-03 DIAGNOSIS — Z30430 Encounter for insertion of intrauterine contraceptive device: Secondary | ICD-10-CM | POA: Diagnosis not present

## 2019-10-03 DIAGNOSIS — Z30014 Encounter for initial prescription of intrauterine contraceptive device: Secondary | ICD-10-CM

## 2019-10-03 DIAGNOSIS — F53 Postpartum depression: Secondary | ICD-10-CM

## 2019-10-03 NOTE — Progress Notes (Signed)
Subjective:     Brenda Fuentes is a 23 y.o. female who presents for a postpartum visit. She is 6 weeks postpartum following a spontaneous vaginal delivery. I have fully reviewed the prenatal and intrapartum course. The delivery was at 96 gestational weeks. Outcome: spontaneous vaginal delivery. Anesthesia: epidural. Postpartum course has been complicated by worsening depression. Baby's course has been uncomplicated. Baby is feeding by breast. Bleeding no bleeding. Bowel function is normal. Bladder function is normal. Patient is not sexually active. Contraception method is abstinence. Postpartum depression screening: positive. Depression screen [facility] 2/9 10/03/2019 08/09/2019 07/19/2019 07/04/2019 06/20/2019  Decreased Interest 2 1 2 1 2   Down, Depressed, Hopeless 1 1 2 2 1   PHQ - 2 Score 3 2 4 3 3   Altered sleeping 1 3 3 3 3   Tired, decreased energy 2 2 2 2 3   Change in appetite 3 2 3 2 3   Feeling bad or failure about yourself  1 1 2 2 1   Trouble concentrating 2 1 2 3 3   Moving slowly or fidgety/restless 1 1 1 1 1   Suicidal thoughts 0 0 1 1 0  PHQ-9 Score 13 12 18 17 17   Difficult doing work/chores Very difficult Somewhat difficult Very difficult Very difficult Very difficult  Some recent data might be hidden   The following portions of the patient's history were reviewed and updated as appropriate: allergies, current medications, past family history, past medical history, past social history, past surgical history and problem list.  Review of Systems Pertinent items noted in HPI and remainder of comprehensive ROS otherwise negative.   Objective:    LMP 12/04/2018   General:  alert, cooperative and appears stated age   Breasts:  inspection negative, no nipple discharge or bleeding, no masses or nodularity palpable  Lungs: clear to auscultation bilaterally  Heart:  regular rate and rhythm, S1, S2 normal, no murmur, click, rub or gallop  Abdomen: soft, non-tender; bowel sounds normal; no  masses,  no organomegaly   Vulva:  normal  Vagina: normal vagina, no discharge, exudate, lesion, or erythema  Cervix:  multiparous appearance  Corpus: normal size, contour, position, consistency, mobility, non-tender  Adnexa:  no mass, fullness, tenderness  Rectal Exam: Not performed.        Assessment:     6 weeks postpartum exam. Pap smear not done at today's visit.   Plan:    1. Contraception: IUD see insertion note. 2. Labs obtained-will follow up accordingly 3. Follow up in: 8 weeks or as needed.   4.declined flu vaccine     Brenda Fuentes is a 23 y.o. year old [id number] Caucasian female who presents for placement of a paragard IUD.  Patient's last menstrual period was 12/04/2018. LMP 12/04/2018  Last sexual intercourse was 7 weeks ago.  The risks and benefits of the method and placement have been thouroughly reviewed with the patient and all questions were answered.  Specifically the patient is aware of failure rate of 12/998, expulsion of the IUD and of possible perforation.  The patient is aware of irregular bleeding due to the method and understands the incidence of irregular bleeding diminishes with time.  Signed copy of informed consent in chart.   Time out was performed.  A graves speculum was placed in the vagina.  The cervix was visualized, prepped using Betadine, and grasped with a single tooth tenaculum. The uterus was found to be neutral and it sounded to 7 cm.  Paragard IUD placed per manufacturer's recommendations.  The strings were trimmed to 3 cm.  The patient was given post procedure instructions, including signs and symptoms of infection and to check for the strings after each menses or each month, and refraining from intercourse or anything in the vagina for 3 days.  She was given a paragard care card with date placed, and date paragard to be removed.    Kelson Queenan Suzan Nailer, CNM

## 2019-10-03 NOTE — Progress Notes (Signed)
Patient is here for a post partum visit. Is breast feeding and doing well. Has not had a period. Has not resumed intercourse. IUD insertion next week per patient. PhQ9 13.

## 2019-10-03 NOTE — Patient Instructions (Signed)

## 2019-10-04 LAB — CBC
Hematocrit: 36.8 % (ref 34.0–46.6)
Hemoglobin: 11.9 g/dL (ref 11.1–15.9)
MCH: 25.1 pg — ABNORMAL LOW (ref 26.6–33.0)
MCHC: 32.3 g/dL (ref 31.5–35.7)
MCV: 78 fL — ABNORMAL LOW (ref 79–97)
Platelets: 301 10*3/uL (ref 150–450)
RBC: 4.74 x10E6/uL (ref 3.77–5.28)
RDW: 15.5 % — ABNORMAL HIGH (ref 11.7–15.4)
WBC: 8.8 10*3/uL (ref 3.4–10.8)

## 2019-10-04 LAB — VITAMIN D 25 HYDROXY (VIT D DEFICIENCY, FRACTURES): Vit D, 25-Hydroxy: 24 ng/mL — ABNORMAL LOW (ref 30.0–100.0)

## 2019-10-04 LAB — FERRITIN: Ferritin: 37 ng/mL (ref 15–150)

## 2019-10-09 ENCOUNTER — Ambulatory Visit: Payer: Medicaid Other | Admitting: Licensed Clinical Social Worker

## 2019-10-09 ENCOUNTER — Other Ambulatory Visit: Payer: Self-pay | Admitting: Obstetrics and Gynecology

## 2019-10-09 DIAGNOSIS — F331 Major depressive disorder, recurrent, moderate: Secondary | ICD-10-CM

## 2019-10-09 DIAGNOSIS — E559 Vitamin D deficiency, unspecified: Secondary | ICD-10-CM | POA: Insufficient documentation

## 2019-10-09 DIAGNOSIS — F411 Generalized anxiety disorder: Secondary | ICD-10-CM

## 2019-10-09 MED ORDER — VITAMIN D (ERGOCALCIFEROL) 1.25 MG (50000 UNIT) PO CAPS: 50000.0000 [IU] | ORAL_CAPSULE | ORAL | 1 refills | Status: DC

## 2019-10-09 NOTE — Progress Notes (Signed)
Counselor/Therapist Progress Note  Patient ID: Brenda Fuentes, MRN: [id number],    Date: 10/09/2019  Time Spent: 35 minutes    Treatment Type: Psychotherapy  Reported Symptoms: low mood, irritability, anxiety, anxious thoughts   Mental Status Exam:  Appearance:   NA     Behavior:  Appropriate and Sharing  Motor:  Normal  Speech/Language:   Normal Rate  Affect:  NA  Mood:  normal  Thought process:  normal  Thought content:    WNL  Sensory/Perceptual disturbances:    WNL  Orientation:  oriented to person, place and time/date  Attention:  Good  Concentration:  Good  Memory:  WNL  Fund of knowledge:   Good  Insight:    Good  Judgment:   Good  Impulse Control:  Good   Risk Assessment: Danger to Self:  No Self-injurious Behavior: No Danger to Others: No Duty to Warn:no Physical Aggression / Violence:No  Access to Firearms a concern: No  Gang Involvement:No   Subjective: Patient was engaged and cooperative throughout the session using time effectively to discuss thoughts and feelings. Patient voices continued motivation for treatment and understanding of mood and anxiety issues related to marital challenges.  Patient is likely to benefit from future treatment because she remains motivated to decrease symptoms and improve functioning.   Interventions: Cognitive Behavioral Therapy  Established psychological safety. Engaged patient in processing current psychosocial stressors, continued mood and anxiety issues related to marital challenges. Validated patient's feelings of frustration, resentment and sadness, and identified these feelings within the marriage. Explored patient's options for increasing support from husband and others. Provided support through active listening, validation of feelings, and highlighted patient's strengths.     Diagnosis:   ICD-10-CM   1. Generalized anxiety disorder  F41.1   2. Major depressive disorder, recurrent episode, moderate (HCC)  F33.1      Plan: Continue use of CBTsto address patient's depression and anxiety symptoms. Patient is encouraged to continue medication management.  Interpreter used: NA   Milton Ferguson, LCSW

## 2019-10-16 ENCOUNTER — Ambulatory Visit: Payer: Medicaid Other | Admitting: Obstetrics and Gynecology

## 2019-10-22 ENCOUNTER — Ambulatory Visit: Payer: Medicaid Other | Admitting: Licensed Clinical Social Worker

## 2019-10-22 DIAGNOSIS — F411 Generalized anxiety disorder: Secondary | ICD-10-CM

## 2019-10-22 DIAGNOSIS — F331 Major depressive disorder, recurrent, moderate: Secondary | ICD-10-CM

## 2019-10-22 NOTE — Progress Notes (Signed)
Counselor/Therapist Progress Note  Patient ID: Brenda Fuentes, MRN: [id number],    Date: 10/22/2019  Time Spent: 45 minutes    Treatment Type: Psychotherapy  Reported Symptoms: Sleep disturbance and depresssed mood, overwhelmed, irritability, anxious thoughts   Mental Status Exam:  Appearance:   NA     Behavior:  Appropriate and Sharing  Motor:  NA  Speech/Language:   Normal Rate  Affect:  NA  Mood:  normal  Thought process:  normal  Thought content:    WNL  Sensory/Perceptual disturbances:    WNL  Orientation:  oriented to person, place and time/date  Attention:  Good  Concentration:  Good  Memory:  WNL  Fund of knowledge:   Good  Insight:    Good  Judgment:   Good  Impulse Control:  Good   Risk Assessment: Danger to Self:  No Self-injurious Behavior: No Danger to Others: No Duty to Warn:no Physical Aggression / Violence:No  Access to Firearms a concern: No  Gang Involvement:No   Subjective: Patient was engaged and cooperative throughout the session using time effectively to discuss thoughts and feelings. Patient voices continued motivation for treatment and understanding of depression and anxiety symptoms. Patient is likely to benefit from future treatment because she remains motivated to decrease symptoms and reports benefit of regular sessions in addressing these symptoms. Patient continues to report that she is not on medication.   Interventions: Cognitive Behavioral Therapy  Established psychological safety. Engaged patient in processing current psychosocial stressors, continued symptoms of depression and anxiety; relationship challenges. Explored patient's perception of relationship conflict, highlighting thoughts and feelings. Discussed patient seeking support from friends and family to [place] her in caring for herself and her needs, praised patient for following through with this. Encouraged patient to reconsider getting back on meds. Provided support through  active listening, validation of feelings, and highlighted patient's strengths.   Diagnosis:   ICD-10-CM   1. Generalized anxiety disorder  F41.1   2. Major depressive disorder, recurrent episode, moderate (HCC)  F33.1    Plan: Continue use of CBTsto address patient's depression and anxiety symptoms. Patient is encouraged to continue medication management.  Interpreter used: NA  Milton Ferguson, LCSW

## 2019-11-04 ENCOUNTER — Ambulatory Visit: Payer: Medicaid Other | Admitting: Licensed Clinical Social Worker

## 2019-11-28 ENCOUNTER — Ambulatory Visit: Payer: Medicaid Other | Admitting: Obstetrics and Gynecology

## 2019-12-02 ENCOUNTER — Other Ambulatory Visit: Payer: Self-pay

## 2019-12-02 ENCOUNTER — Encounter: Payer: Self-pay | Admitting: Certified Nurse Midwife

## 2019-12-02 ENCOUNTER — Ambulatory Visit (INDEPENDENT_AMBULATORY_CARE_PROVIDER_SITE_OTHER): Payer: Medicaid Other | Admitting: Certified Nurse Midwife

## 2019-12-02 VITALS — BP 118/82 | HR 86 | Ht 66.0 in | Wt 206.4 lb

## 2019-12-02 DIAGNOSIS — Z30431 Encounter for routine checking of intrauterine contraceptive device: Secondary | ICD-10-CM

## 2019-12-02 NOTE — Patient Instructions (Signed)

## 2019-12-02 NOTE — Progress Notes (Signed)
  GYNECOLOGY OFFICE ENCOUNTER NOTE  History:  23 y.o. [id number] here today for today for IUD string check; Paragard  IUD was placed  10/03/19. No complaints about the IUD, no concerning side effects.  The following portions of the patient's history were reviewed and updated as appropriate: allergies, current medications, past family history, past medical history, past social history, past surgical history and problem list. Last pap smear on 02/18/19 was normal, negative HRHPV.  Review of Systems:  Pertinent items are noted in HPI.   Objective:  Physical Exam unknown if currently breastfeeding. CONSTITUTIONAL: Well-developed, well-nourished female in no acute distress.  HENT:  Normocephalic, atraumatic. External right and left ear normal. Oropharynx is clear and moist EYES: Conjunctivae and EOM are normal. Pupils are equal, round, and reactive to light. No scleral icterus.  NECK: Normal range of motion, supple, no masses CARDIOVASCULAR: Normal heart rate noted RESPIRATORY: Effort and breath sounds normal, no problems with respiration noted ABDOMEN: Soft, no distention noted.   PELVIC: Normal appearing external genitalia; normal appearing vaginal mucosa and cervix.  IUD strings visualized, about 3 cm in length outside cervix.   Assessment & Plan:  Patient to keep IUD in place for up to seven years; can come in for removal if she desires pregnancy earlier or for any concerning side effects.  Philip Aspen, CNM

## 2020-03-04 ENCOUNTER — Other Ambulatory Visit
Admission: RE | Admit: 2020-03-04 | Discharge: 2020-03-04 | Disposition: A | Discharge status: Home / Self Care | Payer: Medicaid Other | Source: Ambulatory Visit | Attending: Student | Admitting: Student

## 2020-03-04 DIAGNOSIS — R1084 Generalized abdominal pain: Secondary | ICD-10-CM | POA: Insufficient documentation

## 2020-03-04 DIAGNOSIS — R197 Diarrhea, unspecified: Secondary | ICD-10-CM | POA: Diagnosis present

## 2020-03-04 LAB — C DIFFICILE QUICK SCREEN W PCR REFLEX
C Diff antigen: NEGATIVE
C Diff interpretation: NOT DETECTED
C Diff toxin: NEGATIVE

## 2020-03-05 LAB — CALPROTECTIN, FECAL: Calprotectin, Fecal: 51 ug/g (ref 0–120)

## 2020-03-06 LAB — OVA + PARASITE EXAM

## 2020-03-06 LAB — O&P RESULT

## 2020-03-08 LAB — STOOL CULTURE REFLEX - CMPCXR

## 2020-03-08 LAB — STOOL CULTURE: E coli, Shiga toxin Assay: NEGATIVE

## 2020-03-08 LAB — STOOL CULTURE REFLEX - RSASHR

## 2020-06-22 ENCOUNTER — Telehealth: Payer: Self-pay | Admitting: Licensed Clinical Social Worker

## 2020-06-22 NOTE — Telephone Encounter (Signed)
Patient left vm for LCSW requesting and appt.

## 2020-07-14 ENCOUNTER — Telehealth: Payer: Self-pay | Admitting: Licensed Clinical Social Worker

## 2020-07-14 NOTE — Telephone Encounter (Signed)
LCSW received message from clerical that pt. Came in to [facility] to sign "paper work" however the individual staff did not know which paperwork and had patient sign ROI. LCSW left VM informing patient that she needed to come back into the health department to sign consent and [employer] prior to scheduled appt.

## 2020-07-15 ENCOUNTER — Ambulatory Visit: Payer: Medicaid Other | Admitting: Licensed Clinical Social Worker

## 2020-07-18 ENCOUNTER — Emergency Department: Payer: Medicaid Other

## 2020-07-18 ENCOUNTER — Emergency Department
Admission: EM | Admit: 2020-07-18 | Discharge: 2020-07-18 | Disposition: A | Discharge status: Home / Self Care | Payer: Medicaid Other | Attending: Emergency Medicine | Admitting: Emergency Medicine

## 2020-07-18 ENCOUNTER — Other Ambulatory Visit: Payer: Self-pay

## 2020-07-18 ENCOUNTER — Encounter: Payer: Self-pay | Admitting: Emergency Medicine

## 2020-07-18 DIAGNOSIS — Z87891 Personal history of nicotine dependence: Secondary | ICD-10-CM | POA: Diagnosis not present

## 2020-07-18 DIAGNOSIS — R002 Palpitations: Secondary | ICD-10-CM | POA: Insufficient documentation

## 2020-07-18 LAB — CBC
HCT: 39.5 % (ref 36.0–46.0)
Hemoglobin: 12.6 g/dL (ref 12.0–15.0)
MCH: 25.9 pg — ABNORMAL LOW (ref 26.0–34.0)
MCHC: 31.9 g/dL (ref 30.0–36.0)
MCV: 81.3 fL (ref 80.0–100.0)
Platelets: 332 10*3/uL (ref 150–400)
RBC: 4.86 MIL/uL (ref 3.87–5.11)
RDW: 13.9 % (ref 11.5–15.5)
WBC: 9 10*3/uL (ref 4.0–10.5)
nRBC: 0 % (ref 0.0–0.2)

## 2020-07-18 LAB — TROPONIN I (HIGH SENSITIVITY)
Troponin I (High Sensitivity): 5 ng/L (ref <18)
Troponin I (High Sensitivity): 5 ng/L (ref <18)

## 2020-07-18 LAB — TSH: TSH: 1.072 u[IU]/mL (ref 0.350–4.500)

## 2020-07-18 LAB — BASIC METABOLIC PANEL
Anion gap: 12 (ref 5–15)
BUN: 14 mg/dL (ref 6–20)
CO2: 22 mmol/L (ref 22–32)
Calcium: 9 mg/dL (ref 8.9–10.3)
Chloride: 105 mmol/L (ref 98–111)
Creatinine, Ser: 0.6 mg/dL (ref 0.44–1.00)
GFR calc Af Amer: >60 mL/min (ref >60)
GFR calc non Af Amer: >60 mL/min (ref >60)
Glucose, Bld: 102 mg/dL — ABNORMAL HIGH (ref 70–99)
Potassium: 3.9 mmol/L (ref 3.5–5.1)
Sodium: 139 mmol/L (ref 135–145)

## 2020-07-18 LAB — FIBRIN DERIVATIVES D-DIMER (ARMC ONLY): Fibrin derivatives D-dimer (ARMC): 164.4 ng/mL (FEU) (ref 0.00–499.00)

## 2020-07-18 MED ORDER — SODIUM CHLORIDE 0.9% FLUSH: 3.0000 mL | Freq: Once | INTRAVENOUS | Status: DC

## 2020-07-18 NOTE — ED Triage Notes (Signed)
Pt to ED via POV c/o chest tightness, palpitations, "numbness" in her throat, and shortness of breath x 2-3 weeks. Pt states that she was seen at [facility] and was told that her Thyroid was slightly elevated. Pt states that she followed up with her PCP and she was started on propanolol. Pt states that she still feels like her heart is racing. Pt states that she feels like her neck and face feels number. Pt states that it is on the bottom half of both sided of her neck.

## 2020-07-18 NOTE — ED Notes (Signed)
Repeat VS obtained by this RN. Pt visualized in NAD at this time, sitting wheelchair in lobby with husband.

## 2020-07-18 NOTE — Discharge Instructions (Addendum)
Please call Dr. Glennis Brink office tomorrow morning.  Let them know you were in the emergency room with palpitations and chest discomfort.  They should be able to see you fairly quickly.  There is a good chance that they will put you on a Holter monitor or other cardiac event recorder.  That will help [place] tell what is going on.  Please return here if your symptoms worsen or begin to last longer.

## 2020-07-18 NOTE — ED Notes (Signed)
Repeat VS obtained by this RN, repeat Trop collected by this RN. Pt tolerated well. Pt visualized in NAD at this time.

## 2020-07-18 NOTE — ED Provider Notes (Signed)
[facility] Emergency Department Provider Note   ____________________________________________   First MD Initiated Contact with Patient 07/18/20 1613     (approximate)  I have reviewed the triage vital signs and the nursing notes.   HISTORY  Chief Complaint Palpitations and Chest Pain    HPI Brenda Fuentes is a 24 y.o. female who reports that several times a day she gets this feeling of something coming up from her belly into her chest and then she gets palpitations.  She has some numbness in her lower jaw down into the neck occasionally both sides that goes along with this sensation.  Sensation lasts about half an hour and happen several times a day.  She saw her primary care doctor and was seen at [facility] and has been started on propranolol but still has these episodes were she feels like her heart is racing and the sensation shoots up into her chest.           Past Medical History:  Diagnosis Date  . Anxiety   . Depression 2016  . Gestational hypertension   . Hypercholesteremia 2009    Patient Active Problem List   Diagnosis Date Noted  . Vitamin D deficiency 10/09/2019  . Generalized anxiety disorder 05/02/2019  . Major depressive disorder, recurrent episode, moderate (HCC) 05/02/2019  . Anxiety and depression 04/23/2019  . History of preterm delivery 02/18/2019  . History of gestational hypertension 02/18/2019    Past Surgical History:  Procedure Laterality Date  . NO PAST SURGERIES      Prior to Admission medications   Medication Sig Start Date End Date Taking? Authorizing Provider  citalopram (CELEXA) 40 MG tablet Take 1 tablet (40 mg total) by mouth daily. Patient not taking: Reported on 12/02/2019 07/19/19   Doreene Burke, CNM  ferrous sulfate 325 (65 FE) MG tablet Take 1 tablet (325 mg total) by mouth 2 (two) times daily with a meal. 08/21/19   Lawhorn, Vanessa Angola, CNM  ibuprofen (ADVIL) 600 MG tablet Take 1  tablet (600 mg total) by mouth every 6 (six) hours. 08/21/19   Lawhorn, Vanessa Montour, CNM  omeprazole (PRILOSEC) 20 MG capsule Take 20 mg by mouth as needed.    [provider]  PARAGARD INTRAUTERINE COPPER IU by Intrauterine route.    [provider]  Prenatal Vit-Fe Fumarate-FA (PRENATAL MULTIVITAMIN) TABS tablet Take 1 tablet by mouth daily at 12 noon.    [provider]  Vitamin D, Ergocalciferol, (DRISDOL) 1.25 MG (50000 UT) CAPS capsule Take 1 capsule (50,000 Units total) by mouth every 7 (seven) days. Patient not taking: Reported on 12/02/2019 10/09/19   Purcell Nails, CNM    Allergies Penicillins  Family History  Problem Relation Age of Onset  . Hyperlipidemia Mother   . Heart failure Father   . Diabetes Father     Social History Social History   Tobacco Use  . Smoking status: Former Smoker    Packs/day: 0.50    Quit date: 08/2018    Years since quitting: 1.9  . Smokeless tobacco: Never Used  Vaping Use  . Vaping Use: Former  Substance Use Topics  . Alcohol use: No  . Drug use: No    Review of Systems  Constitutional: No fever/chills Eyes: No visual changes. ENT: No sore throat. Cardiovascular: Currently denies chest pain. Respiratory: Currently denies shortness of breath. Gastrointestinal: No abdominal pain.  No nausea, no vomiting.  No diarrhea.  No constipation. Genitourinary: Negative for dysuria.  Musculoskeletal: Negative for back pain. Skin: Negative for rash. Neurological: Negative for headaches, focal weakness   ____________________________________________   PHYSICAL EXAM:  VITAL SIGNS: ED Triage Vitals  Enc Vitals Group     BP 07/18/20 1222 (!) 141/93     Pulse Rate 07/18/20 1222 86     Resp 07/18/20 1222 16     Temp 07/18/20 1222 98.4 F (36.9 C)     Temp Source 07/18/20 1222 Oral     SpO2 07/18/20 1222 98 %     Weight 07/18/20 1223 200 lb (90.7 kg)     Height 07/18/20 1223 5\' 5"  (1.651 m)     Head  Circumference --      Peak Flow --      Pain Score 07/18/20 1222 5     Pain Loc --      Pain Edu? --      Excl. in GC? --     Constitutional: Alert and oriented. Well appearing and in no acute distress. Eyes: Conjunctivae are normal. PER. Head: Atraumatic. Nose: No congestion/rhinnorhea. Mouth/Throat: Mucous membranes are moist.  Oropharynx non-erythematous. Neck: No stridor.  Cardiovascular: Normal rate, regular rhythm. Grossly normal heart sounds.  Good peripheral circulation. Respiratory: Normal respiratory effort.  No retractions. Lungs CTAB. Gastrointestinal: Soft and nontender. No distention. No abdominal bruits.  Musculoskeletal: No lower extremity tenderness nor edema.   Neurologic:  Normal speech and language. No gross focal neurologic deficits are appreciated Skin:  Skin is warm, dry and intact. No rash noted.   ____________________________________________   LABS (all labs ordered are listed, but only abnormal results are displayed)  Labs Reviewed  BASIC METABOLIC PANEL - Abnormal; Notable for the following components:      Result Value   Glucose, Bld 102 (*)    All other components within normal limits  CBC - Abnormal; Notable for the following components:   MCH 25.9 (*)    All other components within normal limits  FIBRIN DERIVATIVES D-DIMER ([facility] ONLY)  TSH  POC URINE PREG, ED  TROPONIN I (HIGH SENSITIVITY)  TROPONIN I (HIGH SENSITIVITY)   ____________________________________________  EKG  EKG read interpreted by me shows normal sinus rhythm rate of 71 normal axis no acute ST-T wave changes ____________________________________________  RADIOLOGY  ED MD interpretation: Chest x-ray read by radiology reviewed by me shows no acute disease  Official radiology report(s): DG Chest 2 View  Result Date: 07/18/2020 CLINICAL DATA:  Chest pain and shortness of breath. Numbness in her throat. Symptoms for 2-3 weeks. EXAM: CHEST - 2 VIEW COMPARISON:  07/16/2020  FINDINGS: The heart size and mediastinal contours are within normal limits. Both lungs are clear. The visualized skeletal structures are unremarkable. IMPRESSION: No active cardiopulmonary disease. Electronically Signed   By: 09/15/2020 M.D.   On: 07/18/2020 13:09    ____________________________________________   PROCEDURES  Procedure(s) performed (including Critical Care):  Procedures   ____________________________________________   INITIAL IMPRESSION / ASSESSMENT AND PLAN / ED COURSE Patient is having atypical chest discomfort and palpitations.  Her EKG, troponin, chest x-ray, D-dimer TSH etc. are all okay.  I do not believe she is in any acute danger from cardiac disease or pulmonary emboli or pneumonia or anything else.  I will have her follow-up with cardiology to see if we can get some sort of event monitor on her and see if we can determine the etiology of her symptoms.              ____________________________________________  FINAL CLINICAL IMPRESSION(S) / ED DIAGNOSES  Final diagnoses:  Palpitations     ED Discharge Orders    None       Note:  This document was prepared using Dragon voice recognition software and may include unintentional dictation errors.    Arnaldo Natal, MD 07/18/20 [phone]

## 2020-07-28 ENCOUNTER — Ambulatory Visit: Payer: Medicaid Other | Admitting: Licensed Clinical Social Worker

## 2020-07-28 DIAGNOSIS — F411 Generalized anxiety disorder: Secondary | ICD-10-CM

## 2020-07-28 NOTE — Progress Notes (Signed)
Counselor/Therapist Progress Note  Patient ID: Brenda Fuentes, MRN: [id number],    Date: 07/28/2020  Time Spent: 45 minutes     Treatment Type: Individual Therapy  Reported Symptoms: Obsessive thinking and anxiety, anxious thoughts and worries, jittery, low mood  Mental Status Exam:  Appearance:   NA     Behavior:  Appropriate and Sharing  Motor:  NA  Speech/Language:   Normal Rate  Affect:  NA  Mood:  normal  Thought process:  normal  Thought content:    WNL  Sensory/Perceptual disturbances:    WNL  Orientation:  oriented to person, place, time/date, situation and day of week  Attention:  Good  Concentration:  Good  Memory:  WNL  Fund of knowledge:   Good  Insight:    Good  Judgment:   Good  Impulse Control:  Good   Risk Assessment: Danger to Self:  No Self-injurious Behavior: No Danger to Others: No Duty to Warn:no Physical Aggression / Violence:No  Access to Firearms a concern: No  Gang Involvement:No   Subjective: Patient was engaged and cooperative throughout the session using time effectively to discuss current psychosocial stressors and symptoms. Patient reports that she is on Celexa for one week. Patient voices motivation for treatment and understanding of anxiety issues. Patient is likely to benefit from future treatment because she is motivated to decrease symptoms and improve functioning and reports previous benefit of the combination of medication management and regular sessions.      Interventions: Cognitive Behavioral Therapy  Established psychological safety. Checked in with patient conducted brief assessment of current symptoms and psychosocial stressors, anxiety symptoms due to multiple stressors.  Provided supportive space encouraging emotional release and processing of current psychosocial stressors, validating patient's feelings of anger and sadness. Identified patient's goal of treatment. Encouraged patient to do deep breathing. Provided supportive  space encouraging emotional release and processing of current psychosocial stressors   Diagnosis:   ICD-10-CM   1. Generalized anxiety disorder  F41.1    Plan: Patient's goal: to be in a better head space, figure out who she is as a person, to be more confident, to do more for herself, find herself   LCSW and patient agreed to develop a treatment plan at next session.   Future Appointments  Date Time Provider Department Center  08/11/2020  2:30 PM Kathreen Cosier, LCSW [facility] None    Interpreter used: NA  Kathreen Cosier, LCSW

## 2020-08-11 ENCOUNTER — Ambulatory Visit: Payer: Medicaid Other | Admitting: Licensed Clinical Social Worker

## 2020-08-11 DIAGNOSIS — F411 Generalized anxiety disorder: Secondary | ICD-10-CM

## 2020-08-11 DIAGNOSIS — F331 Major depressive disorder, recurrent, moderate: Secondary | ICD-10-CM

## 2020-08-11 NOTE — Progress Notes (Signed)
Counselor/Therapist Progress Note  Patient ID: Brenda Fuentes, MRN: [id number],    Date: 08/11/2020  Time Spent: 48 minutes   Treatment Type: Individual Therapy  Reported Symptoms: depressed mood, anxiety   Mental Status Exam:  Appearance:   NA     Behavior:  Appropriate and Sharing  Motor:  NA  Speech/Language:   Normal Rate  Affect:  NA  Mood:  dysthymic  Thought process:  normal  Thought content:    WNL  Sensory/Perceptual disturbances:    WNL  Orientation:  oriented to person, place, time/date, situation and day of week  Attention:  Good  Concentration:  Good  Memory:  WNL  Fund of knowledge:   Good  Insight:    Good  Judgment:   Good  Impulse Control:  Good   Risk Assessment: Danger to Self:  No Self-injurious Behavior: No Danger to Others: No Duty to Warn:no Physical Aggression / Violence:No  Access to Firearms a concern: No  Gang Involvement:No   Subjective: Patient was engaged and cooperative throughout the session using time effectively to discuss thoughts and feelings. Patient voices continued motivation for treatment and understanding of depression and anxiety. Patient is likely to benefit from future treatment because she remains motivated to decrease symptoms and reports benefit of regular sessions.    Interventions: Cognitive Behavioral Therapy Established psychological safety. Checked in with patient and reviewed previous session regarding goal of treatment. Explored patient's goal of treatment and worked collaboratively to develop CBTs treatment plan. Explored patient's marital challenges, validating patient's frustrations and feelings of sadness. Provided support through active listening, validation of feelings, and highlighted patient's strengths.  Diagnosis:   ICD-10-CM   1. Generalized anxiety disorder  F41.1   2. Major depressive disorder, recurrent episode, moderate (HCC)  F33.1    Plan: Patient's goal: to be in a better head space, figure out  who she is as a person, to be more confident, to do more for herself, find herself.   Increase sense of self - "who am I"  - Values clarification  - Assist patient in developing goal and value directions  - Explore and teach problem solving skills - Assertiveness communication - Monitor progress and discuss barriers   Increase mood regulation  And decrease anxiety  - Questioning and challenging thoughts - Cognitive restructuring  - Mindfulness strategies - Help patient to develop reality-based, positive cognitive messages  - Calming techniques PMR and deep breathing  Future Appointments  Date Time Provider Department Center  08/18/2020 11:00 AM Kathreen Cosier, LCSW [facility] None   Interpreter used: NA  Kathreen Cosier, LCSW

## 2020-08-18 ENCOUNTER — Ambulatory Visit: Payer: Medicaid Other | Admitting: Licensed Clinical Social Worker

## 2020-08-18 DIAGNOSIS — F411 Generalized anxiety disorder: Secondary | ICD-10-CM

## 2020-08-18 DIAGNOSIS — F331 Major depressive disorder, recurrent, moderate: Secondary | ICD-10-CM

## 2020-08-18 NOTE — Progress Notes (Signed)
Counselor/Therapist Progress Note  Patient ID: Brenda Fuentes, MRN: [id number],    Date: 08/18/2020  Time Spent: 47 minutes   Treatment Type: Individual Therapy  Reported Symptoms: depressed mood; anxiety, anxious thoughts   Mental Status Exam:  Appearance:   NA     Behavior:  Appropriate and Sharing  Motor:  NA  Speech/Language:   Normal Rate  Affect:  NA  Mood:  anxious  Thought process:  normal  Thought content:    WNL  Sensory/Perceptual disturbances:    WNL  Orientation:  oriented to person, place, time/date, situation and day of week  Attention:  Good  Concentration:  Good  Memory:  WNL  Fund of knowledge:   Good  Insight:    Good  Judgment:   Good  Impulse Control:  Good   Risk Assessment: Danger to Self:  No Self-injurious Behavior: No Danger to Others: No Duty to Warn:no Physical Aggression / Violence:No  Access to Firearms a concern: No  Gang Involvement:No   Subjective: Patient was engaged and cooperative throughout the session using time effectively to discuss thoughts, feelings, and coping skills. Patient voices continued motivation for treatment and understanding of depression and anxiety issues. Patient is likely to benefit from future treatment because she remains motivated to decrease depression and anxiety and reports benefit of the combination of med management and regular sessions in addressing these symptoms.    Interventions: Cognitive Behavioral Therapy  Established psychological safety. Checked in with patient regarding her week. Engaged patient in processing current psychosocial stressors, continued depression and anxiety symptoms; multiple stressors and challenges with family of origin. Taught patient about core beliefs, and assisted patient in identifying evidence contrary to her unhelpful core beliefs. Discussed self-talk and encouraged patient to develop a list of things that she is proud of herself for.  Provided support through active  listening, validation of feelings, and highlighted patient's strengths.     Diagnosis:   ICD-10-CM   1. Generalized anxiety disorder  F41.1   2. Major depressive disorder, recurrent episode, moderate (HCC)  F33.1    Plan: Patient's goal: to be in a better head space, figure out who she is as a person, to be more confident, to do more for herself, find herself.   Increase sense of self - "who am I"   Values clarification   Assist patient in developing goal and value directions   Explore and teach problem solving skills  Assertiveness communication  Monitor progress and discuss barriers   Increase mood regulation  And decrease anxiety   Questioning and challenging thoughts  Cognitive restructuring   Mindfulness strategies  Help patient to develop reality-based, positive cognitive messages  Calming techniques PMR and deep breathing  Future Appointments  Date Time Provider Department Center  08/25/2020  2:40 PM Kathreen Cosier, LCSW [facility] None    Interpreter used: NA  Kathreen Cosier, LCSW

## 2020-08-20 ENCOUNTER — Other Ambulatory Visit: Payer: Self-pay | Admitting: Internal Medicine

## 2020-08-20 DIAGNOSIS — R200 Anesthesia of skin: Secondary | ICD-10-CM

## 2020-08-25 ENCOUNTER — Ambulatory Visit: Payer: Medicaid Other | Admitting: Licensed Clinical Social Worker

## 2020-08-25 DIAGNOSIS — F331 Major depressive disorder, recurrent, moderate: Secondary | ICD-10-CM

## 2020-08-25 DIAGNOSIS — F411 Generalized anxiety disorder: Secondary | ICD-10-CM

## 2020-08-25 NOTE — Progress Notes (Signed)
Counselor/Therapist Progress Note  Patient ID: Brenda Fuentes, MRN: [id number],    Date: 08/25/2020  Time Spent: 45 minutes   Treatment Type: Individual Therapy  Reported Symptoms: Obsessive thinking, Physical aches and pain and depression, anxiety, anxious thoughts   Mental Status Exam:  Appearance:   NA     Behavior:  Appropriate and Sharing  Motor:  NA  Speech/Language:   NA  Affect:  NA  Mood:  normal  Thought process:  normal  Thought content:    WNL  Sensory/Perceptual disturbances:    WNL  Orientation:  oriented to person, place, time/date and situation  Attention:  Good  Concentration:  Good  Memory:  WNL  Fund of knowledge:   Good  Insight:    Good  Judgment:   Good  Impulse Control:  Good   Risk Assessment: Danger to Self:  No Self-injurious Behavior: No Danger to Others: No Duty to Warn:no Physical Aggression / Violence:No  Access to Firearms a concern: No  Gang Involvement:No   Subjective: Patient was engaged and cooperative throughout the session using time effectively to discuss thoughts and feelings. Patient voices continued motivation for treatment and understanding of depression and anxiety. Patient is likely to benefit from future treatment because she remains motivated to decrease depression and anxiety and reports benefit of regular sessions in addressing these symptoms.   Interventions: Cognitive Behavioral Therapy Established psychological safety. Checked in with patient regarding her week. Provided supportive space encouraging emotional release and processing of current psychosocial stressors, continued patterns of depression and anxiety due to health concerns and ongoing stressors. Validated patient's feelings of worry and concerns. Actively listened to patient share experience of living within her values, providing supportive praise.   Diagnosis:   ICD-10-CM   1. Generalized anxiety disorder  F41.1   2. Major depressive disorder, recurrent  episode, moderate (HCC)  F33.1    Plan: Patient's goal: to be in a better head space, figure out who she is as a person, to be more confident, to do more for herself, find herself.  Increase sense of self -"who am I"  Values clarification   Assist patient in developing goal and value directions   Explore and teach problem solving skills  Assertiveness communication  Monitor progress and discuss barriers   Increase mood regulationAnd decrease anxiety  Questioning and challenging thoughts  Cognitive restructuring   Mindfulness strategies  Help patient to develop reality-based, positive cognitive messages  Calming techniques PMR and deep breathing  Future Appointments  Date Time Provider Department Center  09/03/2020  2:00 PM Kathreen Cosier, LCSW [facility] None  09/09/2020  5:00 PM [facility]   Interpreter used: NA  Kathreen Cosier, LCSW

## 2020-09-03 ENCOUNTER — Ambulatory Visit: Payer: Medicaid Other | Admitting: Licensed Clinical Social Worker

## 2020-09-09 ENCOUNTER — Other Ambulatory Visit: Payer: Self-pay

## 2020-09-09 ENCOUNTER — Ambulatory Visit
Admission: RE | Admit: 2020-09-09 | Discharge: 2020-09-09 | Disposition: A | Discharge status: Home / Self Care | Payer: Medicaid Other | Source: Ambulatory Visit | Attending: Internal Medicine | Admitting: Internal Medicine

## 2020-09-09 DIAGNOSIS — R200 Anesthesia of skin: Secondary | ICD-10-CM | POA: Diagnosis present

## 2020-09-09 DIAGNOSIS — R202 Paresthesia of skin: Secondary | ICD-10-CM | POA: Diagnosis present

## 2020-09-09 MED ORDER — GADOBUTROL 1 MMOL/ML IV SOLN
9.0000 mL | Freq: Once | INTRAVENOUS | Status: AC | PRN
  Administered 2020-09-09: 9 mL via INTRAVENOUS

## 2020-09-13 IMAGING — CR DG CHEST 2V
2 series · 2 of 2 positions shown · non-contrast
Comparison: 07/16/2020

CLINICAL DATA: Chest pain and shortness of breath. Numbness in her
throat. Symptoms for 2-3 weeks.

EXAM:
CHEST - 2 VIEW

[chest pa]
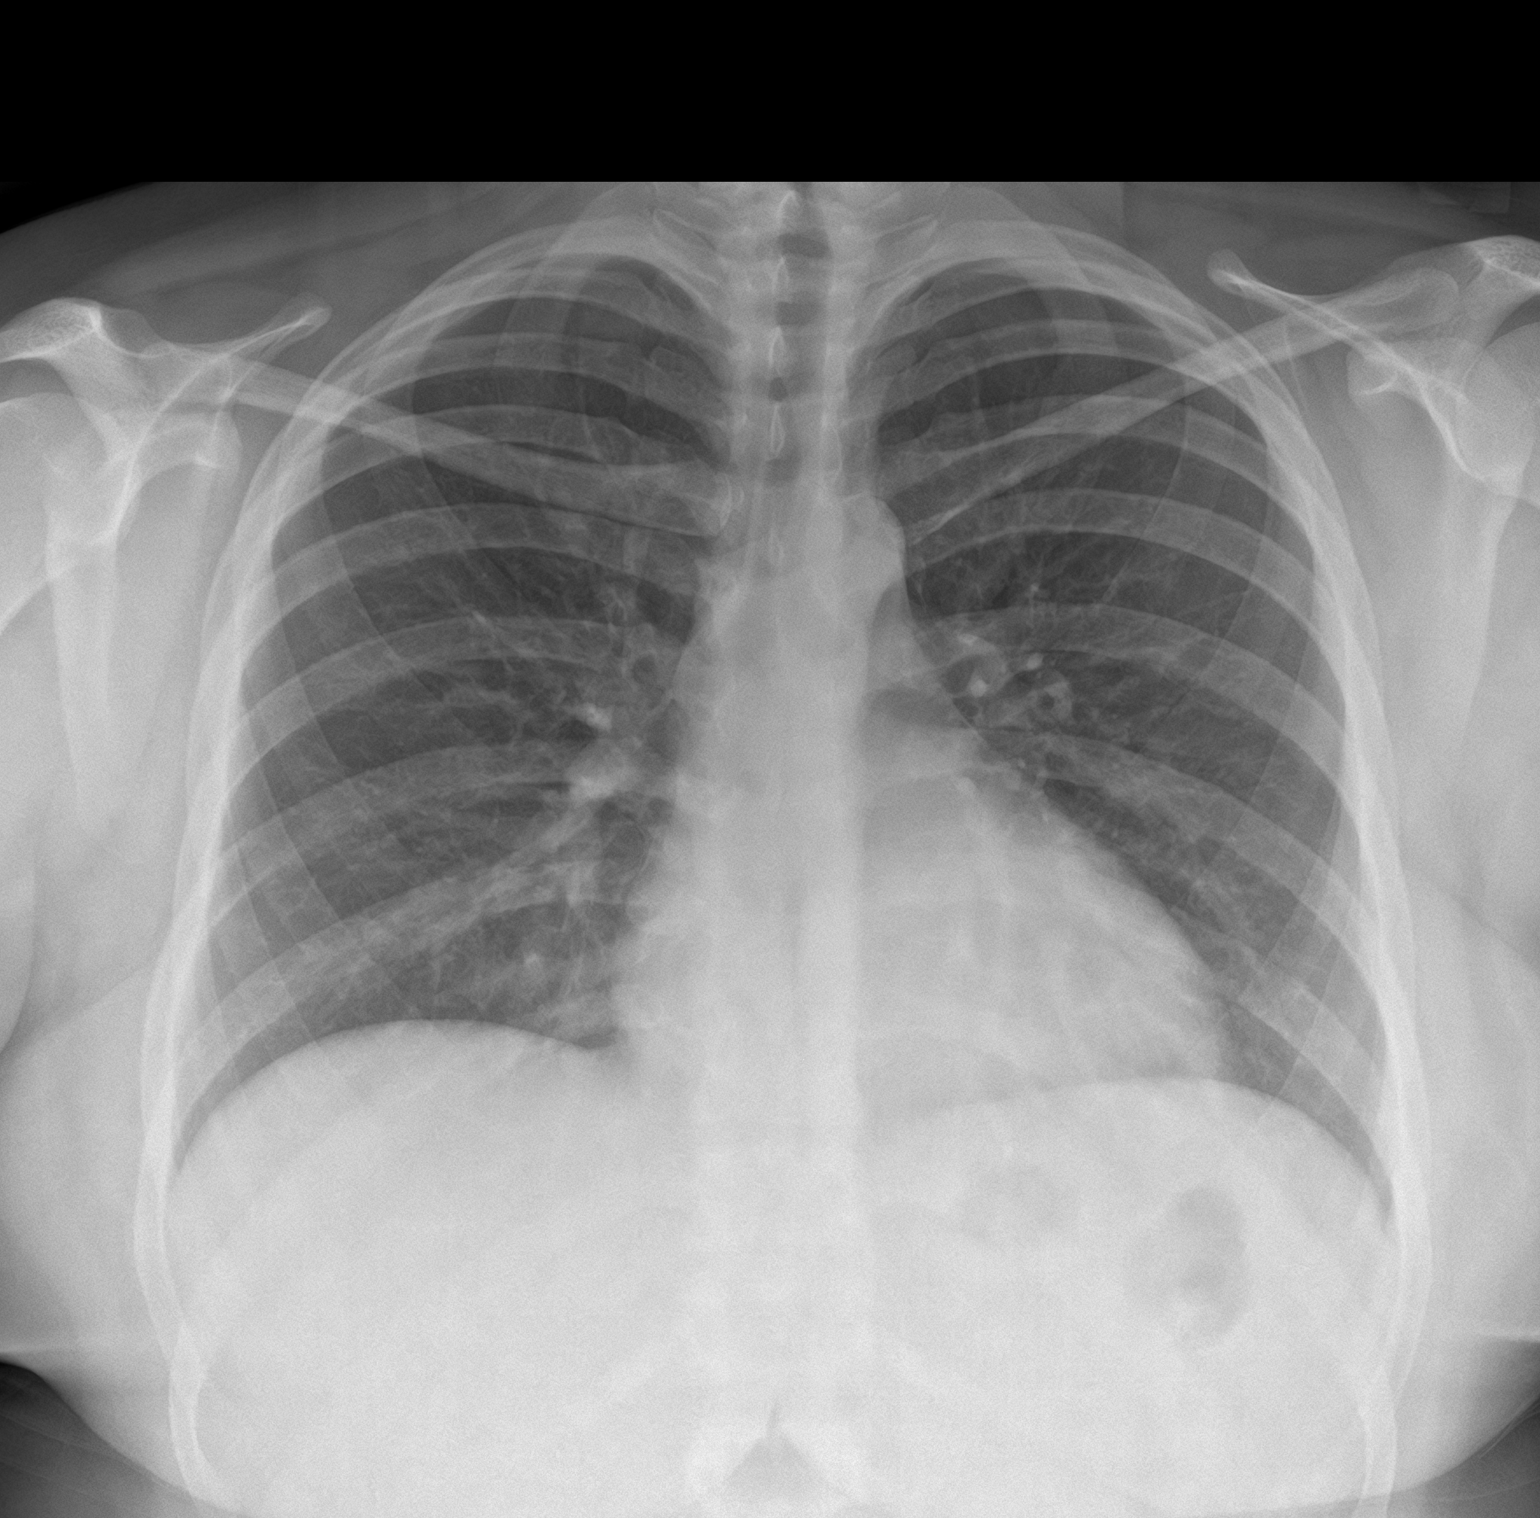

[chest lat]
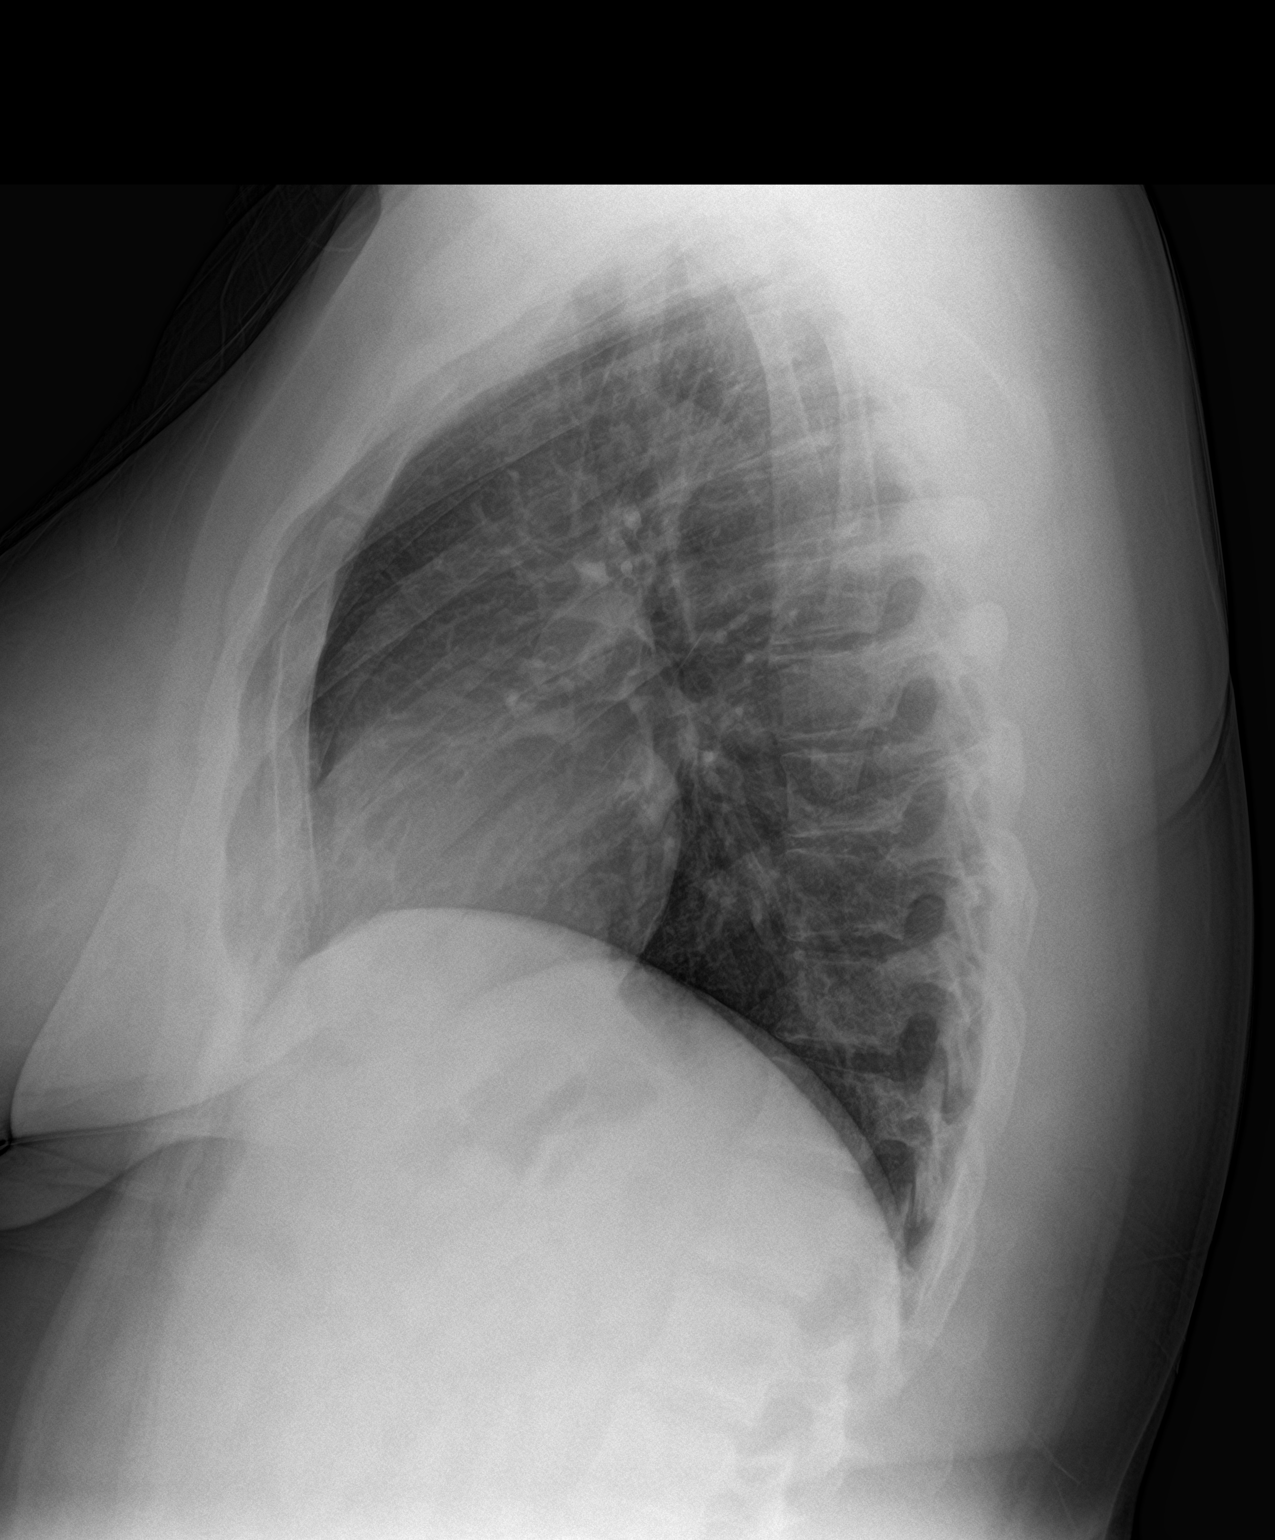

[2 of 2 positions shown; findings below may reference images not displayed]

FINDINGS: The heart size and mediastinal contours are within normal limits.
Both lungs are clear. The visualized skeletal structures are
unremarkable.
IMPRESSION: No active cardiopulmonary disease.

## 2020-09-15 ENCOUNTER — Other Ambulatory Visit: Payer: Self-pay | Admitting: Neurology

## 2020-09-15 DIAGNOSIS — G35 Multiple sclerosis: Secondary | ICD-10-CM | POA: Diagnosis present

## 2020-09-21 ENCOUNTER — Ambulatory Visit: Payer: Medicaid Other | Admitting: Licensed Clinical Social Worker

## 2020-09-21 DIAGNOSIS — F411 Generalized anxiety disorder: Secondary | ICD-10-CM

## 2020-09-21 DIAGNOSIS — F331 Major depressive disorder, recurrent, moderate: Secondary | ICD-10-CM

## 2020-09-21 NOTE — Progress Notes (Signed)
Counselor/Therapist Progress Note  Patient ID: Brenda Fuentes, MRN: [id number],    Date: 09/21/2020  Time Spent:  51 minutes   Treatment Type: Individual Therapy  Reported Symptoms: Obsessive thinking, Anhedonia, Fatigue, Physical aches and pain and low mood, anxious thoughts; depressed mood  Mental Status Exam:  Appearance:   NA     Behavior:  Appropriate and Sharing  Motor:  NA  Speech/Language:   Normal Rate  Affect:  NA  Mood:  dysthymic  Thought process:  normal  Thought content:    WNL  Sensory/Perceptual disturbances:    WNL  Orientation:  oriented to person, place, time/date, situation, day of week and month of year  Attention:  Good  Concentration:  Good  Memory:  WNL  Fund of knowledge:   Good  Insight:    Good  Judgment:   Good  Impulse Control:  Good   Risk Assessment: Danger to Self:  No Self-injurious Behavior: No Danger to Others: No Duty to Warn:no Physical Aggression / Violence:No  Access to Firearms a concern: No  Gang Involvement:No   Subjective: Patient was engaged and cooperative throughout the session using time effectively to discuss thoughts and feelings. Patient voices continued motivation for treatment and understanding of depression and anxiety. Patient is likely to benefit from future treatment because she remains motivated to decrease depression and anxiety and reports benefit of regular sessions in addressing these symptoms.   Interventions: Cognitive Behavioral Therapy Established psychological safety. Checked-in with patient and engaged her in processing current psychosocial stressors, increase depression/anxiety symptoms due to new diagnosis of MS. Provided a supportive space encouraging emotional release, validating patient's feelings of anxiety and worry. Led patient in mindfulness breathing exercise, processed exercise, and encouraged her to practice daily. Provided support through active listening, validation of feelings, and  highlighted patient's strengths.   Diagnosis:   ICD-10-CM   1. Generalized anxiety disorder  F41.1   2. Major depressive disorder, recurrent episode, moderate (HCC)  F33.1    Plan: Patient's goal: to be in a better head space, figure out who she is as a person, to be more confident, to do more for herself, find herself.  Increase sense of self -"who am I"  Values clarification   Assist patient in developing goal and value directions   Explore and teach problem solving skills  Assertiveness communication  Monitor progress and discuss barriers   Increase mood regulationAnd decrease anxiety  Questioning and challenging thoughts  Cognitive restructuring   Mindfulness strategies  Help patient to develop reality-based, positive cognitive messages  Calming techniques PMR and deep breathing  Future Appointments  Date Time Provider Department Center  10/12/2020  2:00 PM Kathreen Cosier, LCSW [facility] None    Interpreter used: NA  Kathreen Cosier, LCSW

## 2020-09-22 ENCOUNTER — Telehealth: Payer: Self-pay

## 2020-09-22 ENCOUNTER — Ambulatory Visit (LOCAL_COMMUNITY_HEALTH_CENTER): Payer: Medicaid Other

## 2020-09-22 ENCOUNTER — Other Ambulatory Visit: Payer: Self-pay

## 2020-09-22 VITALS — Ht 66.0 in | Wt 209.0 lb

## 2020-09-22 DIAGNOSIS — R7612 Nonspecific reaction to cell mediated immunity measurement of gamma interferon antigen response without active tuberculosis: Secondary | ICD-10-CM

## 2020-09-22 NOTE — Progress Notes (Signed)
EPI completed via phone. Referred from [facility] d/t +QFT after a recent admission.  CXR, QFT and other baseline labs available in care everywhere, including negative HIV test in September 2021.  Recently dx with MS.  "Sister's grandmother" had TB per pt and patient was around her frequently when she was dx. Patient also reports that her sister had TB. TB RN explained the difference between LTBI vs Active TB.  Patient assumes grandmother and sister had LTBI.  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  Patient states most likely will start immunosuppressive medication for MS. Patient has 2 Neuro appts next week.  TB RN will f/u with patient after those appts and plan for patient to proceed with LTBI tx. Brenda Campbell, RN

## 2020-09-22 NOTE — Telephone Encounter (Signed)
EPI completed via phone. Referred from [facility].  +QFT and CXR completed during patient's recent admission.  See EPI visit. Richmond Campbell, RN

## 2020-10-07 ENCOUNTER — Other Ambulatory Visit: Payer: Self-pay

## 2020-10-07 DIAGNOSIS — R7612 Nonspecific reaction to cell mediated immunity measurement of gamma interferon antigen response without active tuberculosis: Secondary | ICD-10-CM

## 2020-10-07 NOTE — Progress Notes (Signed)
Tuberculosis treatment orders  All patients are to be monitored per [place] and county TB policies.   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 Treat for latent TB per the following:  Rifampin 600mg  daily by mouth x 4 months, draw LFTs monthly per Dr. .  Medical history- MS and anxiety/depression +QFT and CXR on 09/15/20 without Active TB (See care everywhere) Baseline labs 09/14/20 at [facility]

## 2020-10-12 ENCOUNTER — Ambulatory Visit: Payer: Medicaid Other | Admitting: Licensed Clinical Social Worker

## 2020-10-12 DIAGNOSIS — F331 Major depressive disorder, recurrent, moderate: Secondary | ICD-10-CM

## 2020-10-12 DIAGNOSIS — F411 Generalized anxiety disorder: Secondary | ICD-10-CM

## 2020-10-12 NOTE — Progress Notes (Signed)
Counselor/Therapist Progress Note  Patient ID: Brenda Fuentes, MRN: [id number],    Date: 10/12/2020  Time Spent: 45 minutes    Treatment Type: Individual Therapy  Reported Symptoms: Obsessive thinking, Anhedonia, Appetite disturbance, Isolation and withdrawal, Fatigue, Physical aches and pain and low mood, anxiety, anxiousness; sleep improvement  Mental Status Exam:  Appearance:   NA     Behavior:  Appropriate and Sharing  Motor:  Normal  Speech/Language:   Normal Rate  Affect:  Appropriate and Congruent  Mood:  normal  Thought process:  normal  Thought content:    WNL  Sensory/Perceptual disturbances:    WNL  Orientation:  oriented to person, place and time/date  Attention:  Good  Concentration:  Good  Memory:  WNL  Fund of knowledge:   Good  Insight:    Good  Judgment:   Good  Impulse Control:  Good   Risk Assessment: Danger to Self:  No Self-injurious Behavior: No Danger to Others: No Duty to Warn:no Physical Aggression / Violence:No  Access to Firearms a concern: No  Gang Involvement:No   Subjective: Patient was engaged and cooperative throughout the session using time effectively to discuss thoughts and feelings. Patient voices continued motivation for treatment and understanding of depression and anxiety. Patient is likely to benefit from future treatment because she remains motivated to decrease symptoms and improve functioning.     Interventions: Cognitive Behavioral Therapy Established psychological safety. Checked in with patient regarding current symptoms and psychosocial stressors, continued depression and anxiety symptoms due to multiple stressors related to newly diagnosed MS. Explored patient's perception of current stressors, normalizing patient's feelings of frustration, anger, and sadness. Identified origins of patient's feelings, discussing grief. Encouraged patient to continue to seek support of peers. Provided support through active listening,  validation of feelings, and highlighted patient's strengths.   Diagnosis:   ICD-10-CM   1. Generalized anxiety disorder  F41.1   2. Major depressive disorder, recurrent episode, moderate (HCC)  F33.1    Plan: Patient's goal: to be in a better head space, figure out who she is as a person, to be more confident, to do more for herself, find herself.  Increase sense of self -"who am I"  Values clarification   Assist patient in developing goal and value directions   Explore and teach problem solving skills  Assertiveness communication  Monitor progress and discuss barriers   Increase mood regulationand decrease anxiety  Questioning and challenging thoughts  Cognitive restructuring   Mindfulness strategies  Help patient to develop reality-based, positive cognitive messages  Calming techniques PMR and deep breathing  Future Appointments  Date Time Provider Department Center  10/13/2020  1:00 PM [facility] NURSE [facility] None  10/26/2020  2:00 PM Kathreen Cosier, LCSW [facility] None   Interpreter used: NA  Kathreen Cosier, LCSW

## 2020-10-19 ENCOUNTER — Ambulatory Visit (LOCAL_COMMUNITY_HEALTH_CENTER): Payer: Medicaid Other

## 2020-10-19 ENCOUNTER — Other Ambulatory Visit: Payer: Self-pay

## 2020-10-19 VITALS — Ht 66.0 in | Wt 213.5 lb

## 2020-10-19 DIAGNOSIS — R7612 Nonspecific reaction to cell mediated immunity measurement of gamma interferon antigen response without active tuberculosis: Secondary | ICD-10-CM | POA: Diagnosis not present

## 2020-10-19 MED ORDER — RIFAMPIN 300 MG PO CAPS: 600.0000 mg | ORAL_CAPSULE | Freq: Every day | ORAL | 3 refills | Status: AC

## 2020-10-19 NOTE — Progress Notes (Signed)
Pt to clinic to start LTBI medication. Updated patient's medication list and reviewed patient's baseline symptoms; pt contributes baseline signs and symptoms to her MS. Pt knows that if she starts to experience unusual or worsening symptoms that are not her normal baseline to stop LTBI and call [facility]. Pt had recent labs performed at [facility] in Sept and Oct 2021. Dr. Lyndel Safe reviewed patient's updated medication list and the Tysabri patient will be starting soon; per verbal order by Dr. Alvester Morin pt to proceed with Rifampin 600 mg PO QD x 4 months. Pt to also have monthly LFTs per Dr. Choudrant Blas original order dated 10/12/20. Pt to [facility] on 11/16/20 for LFTs and bottle #2 of Rifampin.

## 2020-10-26 ENCOUNTER — Ambulatory Visit: Payer: Medicaid Other | Admitting: Licensed Clinical Social Worker

## 2020-11-10 ENCOUNTER — Ambulatory Visit: Payer: Medicaid Other | Admitting: Licensed Clinical Social Worker

## 2020-11-10 DIAGNOSIS — F331 Major depressive disorder, recurrent, moderate: Secondary | ICD-10-CM

## 2020-11-10 DIAGNOSIS — F411 Generalized anxiety disorder: Secondary | ICD-10-CM

## 2020-11-10 NOTE — Progress Notes (Signed)
Counselor/Therapist Progress Note  Patient ID: Brenda Fuentes, MRN: [id number],    Date: 11/10/2020  Time Spent: 48 minutes   Treatment Type: Individual Therapy  Reported Symptoms: Feelings of Worthlessness, Hopelessness, Obsessive thinking, Anhedonia, Isolation and withdrawal, Fatigue, Physical aches and pain and low mood, irritability, anxiety, anxious thoughts, passive suicidal ideation, no intent or plan   Mental Status Exam:  Appearance:   NA     Behavior:  Appropriate and Sharing  Motor:  NA  Speech/Language:   Normal Rate  Affect:  NA  Mood:  normal  Thought process:  normal  Thought content:    WNL  Sensory/Perceptual disturbances:    WNL  Orientation:  oriented to person, place, time/date, situation and day of week  Attention:  Good  Concentration:  Good  Memory:  WNL  Fund of knowledge:   Good  Insight:    Good  Judgment:   Good  Impulse Control:  Good   Risk Assessment: Danger to Self:  No Self-injurious Behavior: No Danger to Others: No Duty to Warn:no Physical Aggression / Violence:No  Access to Firearms a concern: No  Gang Involvement:No   Subjective: Patient was engaged and cooperative throughout the session using time effectively to discuss thoughts and feelings. Patient voices continued motivation for treatment and understanding of the impact of choric illness on mood and anxiety issues. Patient is likely to benefit from future treatment because she remains motivated to decrease symptoms and improve functioning and reports benefit of sessions.    Interventions: Cognitive Behavioral Therapy Established psychological safety. Checked in with patient regarding current symptoms and psychosocial stressors, continued depressive symptoms and anxiety due to adjustment/grief due to MS diagnosis. Provided supportive space to allow patient to verbally ventilate. Explored patient's perception of current circumstances, reframing thoughts leading to increased stress.  Encouraged patient to get back to focusing on school which is of value to her. Checked in on med management and assessed for safety. Provided support through active listening, validation of feelings, and highlighted patient's strengths.   Diagnosis:   ICD-10-CM   1. Generalized anxiety disorder  F41.1   2. Major depressive disorder, recurrent episode, moderate (HCC)  F33.1    Plan: Check in - identify one or more things that she sees is possible as future goals Patient's goal: to be in a better head space, figure out who she is as a person, to be more confident, to do more for herself, find herself.  Increase sense of self -"who am I"  Values clarification   Assist patient in developing goal and value directions   Explore and teach problem solving skills  Assertiveness communication  Monitor progress and discuss barriers   Increase mood regulationand decrease anxiety  Questioning and challenging thoughts  Cognitive restructuring   Mindfulness strategies  Help patient to develop reality-based, positive cognitive messages  Calming techniques PMR and deep breathing  Future Appointments  Date Time Provider Department Center  11/16/2020  1:00 PM [facility] 1 [facility] None  11/24/2020  2:00 PM Kathreen Cosier, LCSW [facility] None   Interpreter used: NA  Kathreen Cosier, LCSW

## 2020-11-23 ENCOUNTER — Ambulatory Visit (LOCAL_COMMUNITY_HEALTH_CENTER): Payer: Medicaid Other

## 2020-11-23 ENCOUNTER — Other Ambulatory Visit: Payer: Self-pay

## 2020-11-23 VITALS — Wt 217.0 lb

## 2020-11-23 DIAGNOSIS — R7612 Nonspecific reaction to cell mediated immunity measurement of gamma interferon antigen response without active tuberculosis: Secondary | ICD-10-CM

## 2020-11-23 MED ORDER — RIFAMPIN 300 MG PO CAPS: 600.0000 mg | ORAL_CAPSULE | Freq: Every day | ORAL | 0 refills | Status: AC

## 2020-11-23 NOTE — Progress Notes (Signed)
Here for Rifampin Bottle #2. Reports missing 2 -3 days of Rifampin. States she has 2 pills remaining in current bottle. Reports more nausea than usual. Says she takes Rifampin before breakfast. Consulted with TB Nurse Coordinator who recommends pt to take after dinner or at bedtime with snack. Per pt, no changes in meds from last month. Pt reports she is scheduled to start Tysabri this week. Rifampin 300 mg #60 po dispensed per order by Dr. Kirtland Bouchard. [place] dated 10/12/2020.  To lab for LFT's. Next TB med appt. 12/22/2020 at 1 pm, pt has reminder. Jerel Shepherd, RN

## 2020-11-24 ENCOUNTER — Ambulatory Visit: Payer: Medicaid Other | Admitting: Licensed Clinical Social Worker

## 2020-11-24 DIAGNOSIS — F331 Major depressive disorder, recurrent, moderate: Secondary | ICD-10-CM

## 2020-11-24 DIAGNOSIS — F411 Generalized anxiety disorder: Secondary | ICD-10-CM

## 2020-11-24 LAB — HEPATIC FUNCTION PANEL
ALT: 21 IU/L (ref 0–32)
AST: 12 IU/L (ref 0–40)
Albumin: 4.2 g/dL (ref 3.9–5.0)
Alkaline Phosphatase: 71 IU/L (ref 44–121)
Bilirubin Total: 0.3 mg/dL (ref 0.0–1.2)
Bilirubin, Direct: 0.14 mg/dL (ref 0.00–0.40)
Total Protein: 7.3 g/dL (ref 6.0–8.5)

## 2020-11-24 NOTE — Progress Notes (Signed)
Counselor/Therapist Progress Note  Patient ID: Brenda Fuentes, MRN: [id number],    Date: 11/24/2020  Time Spent: 50 minutes    Treatment Type: Individual Therapy  Reported Symptoms: continued depressive symptoms with some improvement; anxiety, anxious thoughts  Mental Status Exam:  Appearance:   NA     Behavior:  Appropriate and Sharing  Motor:  NA  Speech/Language:   Normal Rate  Affect:  NA  Mood:  normal  Thought process:  normal  Thought content:    WNL  Sensory/Perceptual disturbances:    WNL  Orientation:  oriented to person, place, time/date, situation and day of week  Attention:  Good  Concentration:  Good  Memory:  WNL  Fund of knowledge:   Good  Insight:    Good  Judgment:   Good  Impulse Control:  Good   Risk Assessment: Danger to Self:  No Self-injurious Behavior: No Danger to Others: No Duty to Warn:no Physical Aggression / Violence:No  Access to Firearms a concern: No  Gang Involvement:No   Subjective: Patient was engaged and cooperative throughout the session using time effectively to discuss thoughts and feelings. Patient voices continued motivation for treatment and understanding of depression and anxiety issues. Patient is likely to benefit from future treatment because she remains motivated to decrease depression and anxiety and reports benefit of regular sessions in addressing these symptoms.   Interventions: Cognitive Behavioral Therapy Established psychological safety. Checked in with patient regarding current symptoms and psychosocial stressors, continued anxiety and depressed mood due to chronic health issue. Explored patient's anxiety related to upcoming procedure, validated patient's feelings and using socratic questioning to create flexibility in thinking. Reviewed previous session regarding future goals, providing space for patient to process and explore career goals. Engaged patient in processing marital stressors, validating patient's  feelings of frustration and identifying origins of these feelings within the marriage. Discussed options to increase communication with husband using assertiveness communication strategies. Provided support through active listening, validation of feelings, and highlighted patient's strengths.   Diagnosis:   ICD-10-CM   1. Generalized anxiety disorder  F41.1   2. Major depressive disorder, recurrent episode, moderate (HCC)  F33.1     Plan: Check in regarding infusion; communication with husband.  Patient's goal: to be in a better head space, figure out who she is as a person, to be more confident, to do more for herself, find herself.  Increase sense of self -"who am I"  Values clarification   Assist patient in developing goal and value directions   Explore and teach problem solving skills  Assertiveness communication  Monitor progress and discuss barriers   Increase mood regulationand decrease anxiety  Questioning and challenging thoughts  Cognitive restructuring   Mindfulness strategies  Help patient to develop reality-based, positive cognitive messages  Calming techniques PMR and deep breathing   Future Appointments  Date Time Provider Department Center  12/01/2020 10:00 AM Kathreen Cosier, LCSW [facility] None  12/22/2020  1:00 PM [facility] NURSE [facility] None   Interpreter used: na  Kathreen Cosier, LCSW

## 2020-12-01 ENCOUNTER — Ambulatory Visit: Payer: Medicaid Other | Admitting: Licensed Clinical Social Worker

## 2020-12-01 DIAGNOSIS — F411 Generalized anxiety disorder: Secondary | ICD-10-CM

## 2020-12-01 DIAGNOSIS — F331 Major depressive disorder, recurrent, moderate: Secondary | ICD-10-CM

## 2020-12-01 NOTE — Progress Notes (Signed)
Counselor/Therapist Progress Note  Patient ID: Brenda Fuentes, MRN: [id number],    Date: 12/01/2020  Time Spent: 45 minutes    Treatment Type: Individual Therapy  Reported Symptoms: Obsessive thinking, Anhedonia, Isolation and withdrawal, Fatigue, Physical aches and pain and depressed mood, sadness, lonliness  Mental Status Exam:  Appearance:   NA     Behavior:  Appropriate and Sharing  Motor:  NA  Speech/Language:   Normal Rate  Affect:  NA  Mood:  normal  Thought process:  normal  Thought content:    WNL  Sensory/Perceptual disturbances:    WNL  Orientation:  oriented to person, place, time/date and situation  Attention:  Good  Concentration:  Good  Memory:  WNL  Fund of knowledge:   Good  Insight:    Good  Judgment:   Good  Impulse Control:  Good   Risk Assessment: Danger to Self:  No Self-injurious Behavior: No Danger to Others: No Duty to Warn:no Physical Aggression / Violence:No  Access to Firearms a concern: No  Gang Involvement:No   Subjective: Patient was engaged and cooperative throughout the session using time effectively to discuss thoughts and feelings. Patient voices continued motivation for treatment and understanding of depression and anxiety issues. Patient is likely to benefit from future treatment because she remains motivated to decrease symptoms and improve functioning and reports benefit of regular sessions.   Interventions: Cognitive Behavioral Therapy Established psychological safety. Checked in with patient regarding her week. Provided supportive space encouraging emotional release and processing of current psychosocial stressors,continued depressive symptoms and anxiety. Provided psychoeducation education on mindfulness and encouraged patient to download mindfulness app. Provided support through active listening, validation of feelings, and highlighted patient's strengths.   Diagnosis:   ICD-10-CM   1. Generalized anxiety disorder  F41.1    2. Major depressive disorder, recurrent episode, moderate (HCC)  F33.1    Plan:  Patient's goal: to be in a better head space, figure out who she is as a person, to be more confident, to do more for herself, find herself.  Increase sense of self -"who am I"  Values clarification   Assist patient in developing goal and value directions   Explore and teach problem solving skills  Assertiveness communication  Monitor progress and discuss barriers   Increase mood regulationand decrease anxiety  Questioning and challenging thoughts  Cognitive restructuring   Mindfulness strategies  Help patient to develop reality-based, positive cognitive messages  Calming techniques PMR and deep breathing  Future Appointments  Date Time Provider Department Center  12/15/2020  1:00 PM Kathreen Cosier, LCSW [facility] None  12/22/2020  1:00 PM [facility] NURSE [facility] None    Kathreen Cosier, LCSW

## 2020-12-14 ENCOUNTER — Ambulatory Visit: Payer: Medicaid Other

## 2020-12-14 ENCOUNTER — Other Ambulatory Visit: Payer: Self-pay | Admitting: Neurology

## 2020-12-14 DIAGNOSIS — G35 Multiple sclerosis: Secondary | ICD-10-CM

## 2020-12-15 ENCOUNTER — Ambulatory Visit: Payer: Medicaid Other | Admitting: Licensed Clinical Social Worker

## 2020-12-17 ENCOUNTER — Ambulatory Visit: Payer: Medicaid Other

## 2020-12-21 ENCOUNTER — Ambulatory Visit: Payer: Medicaid Other

## 2020-12-23 ENCOUNTER — Other Ambulatory Visit: Payer: Self-pay

## 2020-12-23 ENCOUNTER — Ambulatory Visit: Payer: Medicaid Other

## 2020-12-23 ENCOUNTER — Ambulatory Visit: Payer: Medicaid Other | Attending: Neurology | Admitting: Physical Therapy

## 2020-12-23 VITALS — BP 121/79 | HR 83

## 2020-12-23 DIAGNOSIS — M62838 Other muscle spasm: Secondary | ICD-10-CM | POA: Insufficient documentation

## 2020-12-23 DIAGNOSIS — R2681 Unsteadiness on feet: Secondary | ICD-10-CM | POA: Diagnosis present

## 2020-12-23 DIAGNOSIS — G35 Multiple sclerosis: Secondary | ICD-10-CM | POA: Insufficient documentation

## 2020-12-23 DIAGNOSIS — R2689 Other abnormalities of gait and mobility: Secondary | ICD-10-CM | POA: Diagnosis present

## 2020-12-23 DIAGNOSIS — M6281 Muscle weakness (generalized): Secondary | ICD-10-CM | POA: Insufficient documentation

## 2020-12-23 DIAGNOSIS — R293 Abnormal posture: Secondary | ICD-10-CM | POA: Diagnosis present

## 2020-12-23 NOTE — Therapy (Signed)
[place] [facility] MAIN [facility] SERVICES [address] [place], [place], [address] Phone: [phone]   Fax:  [phone]  Physical Therapy Evaluation  Patient Details  Name: Brenda Fuentes MRN: [id number] Date of Birth: 07/09/1996 Referring Provider (PT): Marian Sorrow   Encounter Date: 12/23/2020   PT End of Session - 12/23/20 1822    Visit Number 1    Number of Visits 25    Date for PT Re-Evaluation 03/17/21    Authorization Type eval: 01/12    PT Start Time 1648    PT Stop Time 1755    PT Time Calculation (min) 67 min    Equipment Utilized During Treatment Gait belt    Activity Tolerance Patient tolerated treatment well;Patient limited by fatigue;Patient limited by pain    Behavior During Therapy WFL for tasks assessed/performed           Past Medical History:  Diagnosis Date  . Anxiety   . Depression 2016  . Gestational hypertension   . Hypercholesteremia 2009    Past Surgical History:  Procedure Laterality Date  . NO PAST SURGERIES      Vitals:   12/23/20 1701  BP: 121/79  Pulse: 83  SpO2: 100%      Subjective Assessment - 12/23/20 1808    Subjective Patient reports that the walk from her car to the therapy gym was exhausting and has flared up her numbness in her bilateral legs.    Pertinent History 25 year old female with history of migraines, depression, anxiety, vitamin D deficiency and recent diagnosis of multiple sclerosis relapsing remitting presented to the ED on 09/14/20 for four weeks of left sided and trunk paresthesias and weakness as well a Lhermitte's phenomenon, gait imbalance, blurry vision and pain with eye movements. Outpatient MRI brain and cervical spine was obtained which was concerning for MS with some enhancing lesions. Initial plan was for home health IV steroids, but she presented to the ED after they were not initiated and she had lack of response to oral steroids. Screening labs revealed a positive  quantiferon gold prompting initiation of isoniazid and rifapentine. She was treated with IV steroids with some improvement in symptoms. At her first clinic visit we planned to initiate Tysabri due to active MS with spinal cord involvement. Tysabri was started on 12/16 and she unfortunately experienced new symptoms of left eye pain and blurry vision two weeks ago. Therefore there is concern for a clinical relapse prior to therapy. She was referred to physical therapy for gait. MRI C/T spine w/ and w/out contract on 09/15/20 was positive for the following: Hyperintense lesion within the spinal cord at C2-C3 with enhancement concerning for active demyelination.    Limitations Sitting;Reading;Lifting;Standing;Walking    How long can you sit comfortably? 1 hour    How long can you stand comfortably? 10-30 mins    How long can you walk comfortably? short distances with Kesinger/cane for 1 hour    Patient Stated Goals "I want to get stronger so I can play with my kids"    Currently in Pain? No/denies              [facility] PT Assessment - 12/23/20 1654      Assessment   Medical Diagnosis MS/Relapsing Remitting    Referring Provider (PT) Sherryll Burger, [place]    Onset Date/Surgical Date 09/10/20    Hand Dominance Right    Prior Therapy none      Precautions   Precautions None  Restrictions   Weight Bearing Restrictions No      Balance Screen   Has the patient fallen in the past 6 months No    Has the patient had a decrease in activity level because of a fear of falling?  Yes    Is the patient reluctant to leave their home because of a fear of falling?  Yes      Home Environment   Living Environment Private residence    Living Arrangements Spouse/significant other;Children    Type of Home House    Home Access Stairs to enter    Entrance Stairs-Number of Steps 4    Entrance Stairs-Rails Can reach both;Right;Left    Home Layout One level    Home Equipment Delapena - 4 wheels;Cane - quad      Prior  Function   Level of Independence Independent      Cognition   Overall Cognitive Status Within Functional Limits for tasks assessed      Observation/Other Assessments   Focus on Therapeutic Outcomes (FOTO)  48      Sensation   Light Touch Impaired Detail    Light Touch Impaired Details Absent LUE;Impaired RLE;Impaired LLE    Proprioception Appears Intact      Coordination   Heel Shin Test Normal      6 Minute Walk- Baseline   6 Minute Walk- Baseline yes    BP (mmHg) 121/79    HR (bpm) 83    02 Sat (%RA) 100 %      6 Minute walk- Post Test   6 Minute Walk Post Test yes    BP (mmHg) 128/81    HR (bpm) 89    02 Sat (%RA) 100 %    Perceived Rate of Exertion (Borg) 15- Hard      6 minute walk test results    Aerobic Endurance Distance Walked 915    Endurance additional comments No AD used for this test      Standardized Balance Assessment   Standardized Balance Assessment Berg Balance Test;Five Times Sit to Stand    Five times sit to stand comments  13.98      Berg Balance Test   Sit to Stand Able to stand without using hands and stabilize independently    Standing Unsupported Able to stand safely 2 minutes    Sitting with Back Unsupported but Feet Supported on Floor or Stool Able to sit safely and securely 2 minutes    Stand to Sit Sits safely with minimal use of hands    Transfers Able to transfer safely, minor use of hands    Standing Unsupported with Eyes Closed Able to stand 10 seconds with supervision    Standing Unsupported with Feet Together Able to place feet together independently and stand for 1 minute with supervision    From Standing, Reach Forward with Outstretched Arm Can reach forward >12 cm safely (5")    From Standing Position, Pick up Object from Floor Able to pick up shoe safely and easily    From Standing Position, Turn to Look Behind Over each Shoulder Looks behind one side only/other side shows less weight shift    Turn 360 Degrees Able to turn 360  degrees safely but slowly    Standing Unsupported, Alternately Place Feet on Step/Stool Able to stand independently and complete 8 steps >20 seconds    Standing Unsupported, One Foot in Front Able to plae foot ahead of the other independently and hold 30 seconds  Standing on One Leg Able to lift leg independently and hold > 10 seconds    Total Score 48             SUBJECTIVE Chief complaint: decreased endurance/balance, weakness in BLE, numbness in LUE Onset: September 10, 2020 Recent changes in overall health/medication: Yes; recent diagnosis with relapsing remitting multiple sclerosis  Follow-up appointment with MD: Not known at the moment Red flags (bowel/bladder changes, saddle paresthesia, personal history of cancer, chills/fever, night sweats, unrelenting pain): Negative    OBJECTIVE  MUSCULOSKELETAL: Tremor: Absent Bulk: Normal Tone: Normal, no clonus   Strength R/L 4-/3+ Hip flexion 3+/3+ Hip extension  3+/3+ Hip abduction 4-/3+ Knee extension 4/4 Knee flexion 4+/4+ Ankle Plantarflexion 4+/4+ Ankle Dorsiflexion   NEUROLOGICAL:  Mental Status Patient is oriented to person, place and time.  Recent memory is intact.  Remote memory is intact.  Attention span and concentration are intact.  Expressive speech is intact.  Patient's fund of knowledge is within normal limits for educational level.   Sensation Grossly impaired and diminished to light touch Left UE and bilateral LEs as determined by testing dermatomes C2-T2/L2-S2 respectively; patient has intact sensation on bilateral L2 dermatome and spinal levels above.  Proprioception and hot/cold testing deferred on this date   Coordination/Cerebellar Heel to Shin: WNL Rapid alternating movements: WNL    FUNCTIONAL OUTCOME MEASURES FOTO: 48 5STS: 13.98s Berg Balance: 48/56 : 915 ft    ASSESSMENT Clinical Impression: Patient is a pleasant 25 year-old female with a recent diagnosis of multiple  sclerosis relapsing remitting. She presents with absent sensation in BLE and LUE. She has significant weakness in BLE as evidenced by increased time on 5STS and decreased ambulation distance in . She presents with balance deficits with a score of 48/56 on  Berg Balance Scale. Pt presents with deficits in strength, gait and balance. Pt will benefit from skilled PT services to address deficits in balance and decrease risk for future falls.     PLAN Next Visit: Initiate BLE strengthening HEP: Initiate HEP           PT Short Term Goals - 12/23/20 1826      PT SHORT TERM GOAL #1   Title Patient will be independent in home exercise program to improve strength/mobility for better functional independence with ADLs.    Time 6    Period Weeks    Status New    Target Date 02/03/21             PT Long Term Goals - 12/23/20 1827      PT LONG TERM GOAL #1   Title Patient will increase FOTO score to equal to or greater than 57 to demonstrate statistically significant improvement in mobility and quality of life.    Baseline 01/12: 48    Time 12    Period Weeks    Status New    Target Date 03/17/21      PT LONG TERM GOAL #2   Title Patient (< 70 years old) will complete five times sit to stand test in < 10 seconds indicating an increased LE strength and improved balance.    Baseline 01/12: 13.98s    Time 12    Period Weeks    Status New    Target Date 03/17/21      PT LONG TERM GOAL #3   Title Patient will increase Berg Balance score by > 6 points to demonstrate decreased fall risk during functional activities.    Baseline  01/12: 48/56    Time 12    Period Weeks    Status New    Target Date 03/17/21      PT LONG TERM GOAL #4   Title Patient will increase six minute walk test distance to >1000 for progression to community ambulator and improve gait ability    Baseline 01/12: 915 feet without AD    Time 12    Period Weeks    Status New    Target Date 03/17/21                   Plan - 12/23/20 1823    Clinical Impression Statement Patient is a pleasant 25 year-old female with a recent diagnosis of multiple sclerosis relapsing remitting. She presents with absent sensation in BLE and LUE. She has significant weakness in BLE as evidenced by increased time on 5STS and decreased ambulation distance in . She presents with balance deficits with a score of 48/56 on  Berg Balance Scale. Pt presents with deficits in strength, gait and balance. Pt will benefit from skilled PT services to address deficits in balance and decrease risk for future falls.    Personal Factors and Comorbidities Comorbidity 3+;Time since onset of injury/illness/exacerbation;Fitness    Comorbidities migraines, depression, anxiety, vitamin D deficiency, hypercholestermia, and recent diagnosis of multiple sclerosis relapsing remitting    Examination-Activity Limitations Stairs;Stand;Sit;Bend;Lift    Examination-Participation Restrictions School;Community Activity    Stability/Clinical Decision Making Evolving/Moderate complexity    Clinical Decision Making Moderate    Rehab Potential Fair    PT Frequency 2x / week    PT Duration 12 weeks    PT Treatment/Interventions ADLs/Self Care Home Management;Aquatic Therapy;Biofeedback;Gait training;Stair training;Functional mobility training;Therapeutic activities;Therapeutic exercise;Balance training;Neuromuscular re-education;Patient/family education;Manual techniques;Passive range of motion;Joint Manipulations;Spinal Manipulations;Energy conservation    PT Next Visit Plan Initiate BLE strengthening    PT Home Exercise Plan Initiate HEP    Consulted and Agree with Plan of Care Patient           Patient will benefit from skilled therapeutic intervention in order to improve the following deficits and impairments:  Abnormal gait,Decreased activity tolerance,Decreased endurance,Decreased strength,Impaired sensation,Decreased balance,Decreased  coordination,Decreased mobility,Decreased safety awareness,Difficulty walking,Increased muscle spasms  Visit Diagnosis: Muscle weakness (generalized)  Unsteadiness on feet  Other abnormalities of gait and mobility  Multiple sclerosis, relapsing-remitting (HCC)     Problem List Patient Active Problem List   Diagnosis Date Noted  . 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 Vitamin D deficiency 10/09/2019  . Generalized anxiety disorder 05/02/2019  . Major depressive disorder, recurrent episode, moderate (HCC) 05/02/2019  . Anxiety and depression 04/23/2019  . History of preterm delivery 02/18/2019  . History of gestational hypertension 02/18/2019   Jillyn Hidden PT, DPT Amelia Jo 12/23/2020, 6:44 PM  [facility] MAIN [facility] SERVICES [address] [place], [place], [address] Phone: [phone]   Fax:  [phone]  Name: Brenda Fuentes MRN: [id number] Date of Birth: 02/25/1996

## 2020-12-24 ENCOUNTER — Ambulatory Visit: Payer: Medicaid Other

## 2020-12-24 NOTE — Addendum Note (Signed)
Addended by: Amelia Jo on: 12/24/2020 12:53 PM   Modules accepted: Orders

## 2020-12-25 ENCOUNTER — Ambulatory Visit: Payer: Medicaid Other

## 2020-12-25 ENCOUNTER — Ambulatory Visit: Admission: RE | Admit: 2020-12-25 | Payer: Medicaid Other | Source: Ambulatory Visit

## 2020-12-28 ENCOUNTER — Ambulatory Visit: Payer: Medicaid Other

## 2020-12-29 ENCOUNTER — Ambulatory Visit: Payer: Medicaid Other | Admitting: Licensed Clinical Social Worker

## 2020-12-29 DIAGNOSIS — F331 Major depressive disorder, recurrent, moderate: Secondary | ICD-10-CM

## 2020-12-29 DIAGNOSIS — F411 Generalized anxiety disorder: Secondary | ICD-10-CM

## 2020-12-29 NOTE — Progress Notes (Signed)
Counselor/Therapist Progress Note  Patient ID: Brenda Fuentes, MRN: [id number],    Date: 12/29/2020  Time Spent:  46 Minutes   Treatment Type: Psychotherapy  Reported Symptoms: Obsessive thinking, Anhedonia, Sleep disturbance, Isolation and withdrawal, Fatigue, Physical aches and pain and depressed mood, anxiety, anxious thoughts   Mental Status Exam:  Appearance:   NA     Behavior:  Appropriate and Sharing  Motor:  NA  Speech/Language:   Normal Rate  Affect:  NA  Mood:  normal  Thought process:  normal  Thought content:    WNL  Sensory/Perceptual disturbances:    WNL  Orientation:  oriented to person, place, time/date and situation  Attention:  Good  Concentration:  Good  Memory:  WNL  Fund of knowledge:   Good  Insight:    Good  Judgment:   Good  Impulse Control:  Good   Risk Assessment: Danger to Self:  No Self-injurious Behavior: No Danger to Others: No Duty to Warn:no Physical Aggression / Violence:No  Access to Firearms a concern: No  Gang Involvement:No   Subjective: Patient was engaged and cooperative throughout the session using time effectively to discuss thoughts and feelings. Patient voices continued motivation for treatment and understanding of depression and anxiety issues. Patient is likely to benefit from future treatment because she remains motivated to decrease depression and anxiety and reports benefit of regular sessions in addressing these symptoms.   Interventions: Cognitive Behavioral Therapy Established psychological safety. Checked in with patient. Engaged patient in processing current psychosocial stressors, continued depression and anxiety due to challenges with chronic illness. Explored patient's perception of relationship challenges, highlighting unhelpful thoughts, and reframing thoughts leading to distress. Reviewed challenging thoughts, noticing thoughts and mindfulness. Provided support through active listening, validation of feelings, and  highlighted patient's strengths.   Diagnosis:   ICD-10-CM   1. Generalized anxiety disorder  F41.1   2. Major depressive disorder, recurrent episode, moderate (HCC)  F33.1    Plan: Patient's goal: to be in a better head space, figure out who she is as a person, to be more confident, to do more for herself, find herself.  Increase sense of self -"who am I"  Values clarification   Assist patient in developing goal and value directions   Explore and teach problem solving skills  Assertiveness communication  Monitor progress and discuss barriers   Increase mood regulationand decrease anxiety  Questioning and challenging thoughts  Cognitive restructuring   Mindfulness strategies  Help patient to develop reality-based, positive cognitive messages  Calming techniques PMR and deep breathing  Future Appointments  Date Time Provider Department Center  12/30/2020  2:00 PM [facility] NURSE [facility] None  01/04/2021  2:00 PM Kathreen Cosier, LCSW [facility] None  01/06/2021  3:15 PM [facility] PT SUB THERAPIST 1 [facility] None  01/07/2021  6:00 PM [facility]  01/07/2021  7:00 PM [facility]  01/11/2021  1:45 PM [facility] PT SUB THERAPIST 1 [facility] None  01/13/2021  3:15 PM [facility] PT SUB THERAPIST 1 [facility] None    Kathreen Cosier, LCSW

## 2020-12-30 ENCOUNTER — Ambulatory Visit (LOCAL_COMMUNITY_HEALTH_CENTER): Payer: Medicaid Other

## 2020-12-30 ENCOUNTER — Ambulatory Visit: Payer: Medicaid Other

## 2020-12-30 ENCOUNTER — Other Ambulatory Visit: Payer: Self-pay

## 2020-12-30 VITALS — Wt 221.0 lb

## 2020-12-30 DIAGNOSIS — R7612 Nonspecific reaction to cell mediated immunity measurement of gamma interferon antigen response without active tuberculosis: Secondary | ICD-10-CM | POA: Diagnosis not present

## 2020-12-30 MED ORDER — RIFAMPIN 300 MG PO CAPS: 600.0000 mg | ORAL_CAPSULE | Freq: Every day | ORAL | 0 refills | Status: AC

## 2020-12-30 NOTE — Progress Notes (Signed)
Here for [facility] med management. Due for Rifampin bottle #3. Per pt, has not missed any pills and took last Rifampin from current bottle yesterday. Says nausea has improved from last month.  Reports new med: Baclofen 5 mg prn muscle spasms. Also reports sharp abd pain LUQ 1-2 x week for past month. Improves with rest. Consult K. Alvester Morin, MD who advises pt to continue with Rifampin as prescribed as she does not think symptom is from Rifampin and to follow up with PCP for symptom evaluation. Pt states she has appt with PCP next week and will discuss with provider. Rifampin 300 mg #60 dispensed today with instructions to take 2 pills daily by mouth. Advised pt to notify [facility] if any problems. TB Coord nurse contact card and Rifampin info sheet given. To lab for LFT's Next appt 01/26/21 at 2:15pm for TB med completion/Rifampin #4. Jerel Shepherd, RN

## 2020-12-31 LAB — HEPATIC FUNCTION PANEL
ALT: 23 IU/L (ref 0–32)
AST: 15 IU/L (ref 0–40)
Albumin: 4.5 g/dL (ref 3.9–5.0)
Alkaline Phosphatase: 75 IU/L (ref 44–121)
Bilirubin Total: <0.2 mg/dL (ref 0.0–1.2)
Bilirubin, Direct: <0.1 mg/dL (ref 0.00–0.40)
Total Protein: 7.4 g/dL (ref 6.0–8.5)

## 2021-01-01 NOTE — Progress Notes (Signed)
Attestation of Medical Director for clinical support staff: I agree with the care provided to this patient and was available for consultation.  I was consulted at the point of care and documentation reflects my recommendations.   Threasa Kinch Niles Assata Juncaj, MD, MPH, ABFM [facility] Medical Director  

## 2021-01-04 ENCOUNTER — Ambulatory Visit: Payer: Medicaid Other | Admitting: Licensed Clinical Social Worker

## 2021-01-04 DIAGNOSIS — F331 Major depressive disorder, recurrent, moderate: Secondary | ICD-10-CM

## 2021-01-04 DIAGNOSIS — F411 Generalized anxiety disorder: Secondary | ICD-10-CM

## 2021-01-04 NOTE — Progress Notes (Signed)
Counselor/Therapist Progress Note  Patient ID: Brenda Fuentes, MRN: [id number],    Date: 01/04/2021  Time Spent: 42 minutes   Treatment Type: Psychotherapy  Reported Symptoms: Obsessive thinking, Sleep disturbance, Fatigue, Physical aches and pain and anxiety, anxious thoughts   Mental Status Exam:  Appearance:   NA     Behavior:  Appropriate and Sharing  Motor:  NA  Speech/Language:   Normal Rate  Affect:  NA  Mood:  normal  Thought process:  normal  Thought content:    WNL  Sensory/Perceptual disturbances:    WNL  Orientation:  oriented to person, place, time/date and situation  Attention:  Good  Concentration:  Good  Memory:  WNL  Fund of knowledge:   Good  Insight:    Fair  Judgment:   Good  Impulse Control:  Good   Risk Assessment: Danger to Self:  No Self-injurious Behavior: No Danger to Others: No Duty to Warn:no Physical Aggression / Violence:No  Access to Firearms a concern: No  Gang Involvement:No   Subjective: Patient was engaged and cooperative throughout the session using time effectively to discuss thoughts and feelings. Patient voices continued motivation for treatment and understanding of depression and anxiety. Patient is likely to benefit from future treatment because she remains motivated to decrease depression and anxiety and reports benefit of regular sessions in addressing these symptoms.   Interventions: Cognitive Behavioral Therapy and Mindfulness Meditation Established psychological safety. Checked in with patient regarding her week. Engaged patient in processing current psychosocial stressors, increased anxiety due to financial stressors. Explored patient's perception of financial stressors, identifying points of intervention. Discussed things that we can change and things that we cant change. Reviewed mindfulness and encouraged patient to use mindfulness app. To manage anxiety symptoms. Provided support through active listening, validation of  feelings, and highlighted patient's strengths.   Diagnosis:   ICD-10-CM   1. Generalized anxiety disorder  F41.1   2. Major depressive disorder, recurrent episode, moderate (HCC)  F33.1    Plan: Patient's goal: to be in a better head space, figure out who she is as a person, to be more confident, to do more for herself, find herself.  Increase sense of self -"who am I"  Values clarification   Assist patient in developing goal and value directions   Explore and teach problem solving skills  Assertiveness communication  Monitor progress and discuss barriers   Increase mood regulationand decrease anxiety  Questioning and challenging thoughts  Cognitive restructuring   Mindfulness strategies  Help patient to develop reality-based, positive cognitive messages  Calming techniques PMR and deep breathing  Future Appointments  Date Time Provider Department Center  01/06/2021  3:15 PM [facility] PT SUB THERAPIST 1 [facility] None  01/07/2021  6:00 PM [facility]  01/07/2021  7:00 PM [facility]  01/11/2021  1:45 PM [facility] PT SUB THERAPIST 1 [facility] None  01/12/2021  2:00 PM Kathreen Cosier, LCSW [facility] None  01/13/2021  3:15 PM [facility] PT SUB THERAPIST 1 [facility] None  01/26/2021  2:15 PM [facility] NURSE [facility] None   Kathreen Cosier, LCSW

## 2021-01-06 ENCOUNTER — Other Ambulatory Visit: Payer: Self-pay

## 2021-01-06 ENCOUNTER — Ambulatory Visit: Payer: Medicaid Other | Admitting: Physical Therapy

## 2021-01-06 DIAGNOSIS — M62838 Other muscle spasm: Secondary | ICD-10-CM

## 2021-01-06 DIAGNOSIS — M6281 Muscle weakness (generalized): Secondary | ICD-10-CM

## 2021-01-06 DIAGNOSIS — G35 Multiple sclerosis: Secondary | ICD-10-CM

## 2021-01-06 DIAGNOSIS — R293 Abnormal posture: Secondary | ICD-10-CM

## 2021-01-06 DIAGNOSIS — R2689 Other abnormalities of gait and mobility: Secondary | ICD-10-CM

## 2021-01-06 DIAGNOSIS — R2681 Unsteadiness on feet: Secondary | ICD-10-CM

## 2021-01-06 NOTE — Therapy (Signed)
[facility] MAIN [facility] SERVICES [address] [place], [place], [address] Phone: [phone]   Fax:  [phone]  Physical Therapy Treatment  Patient Details  Name: Brenda Fuentes MRN: [id number] Date of Birth: 09-Apr-1996 Referring Provider (PT): Marian Sorrow   Encounter Date: 01/06/2021   PT End of Session - 01/06/21 1530    Visit Number 2    Number of Visits 25    Date for PT Re-Evaluation 03/17/21    Authorization Type eval: 01/12    PT Start Time 1521    PT Stop Time 1605    PT Time Calculation (min) 44 min    Equipment Utilized During Treatment Gait belt    Activity Tolerance Patient tolerated treatment well;Patient limited by fatigue;Patient limited by pain    Behavior During Therapy WFL for tasks assessed/performed           Past Medical History:  Diagnosis Date  . Anxiety   . Depression 2016  . Gestational hypertension   . Hypercholesteremia 2009    Past Surgical History:  Procedure Laterality Date  . NO PAST SURGERIES      There were no vitals filed for this visit.   Subjective Assessment - 01/06/21 1526    Subjective Patient reports that she has started to bump into her wall recently. She notices that she veers more to the left side. She denies of any falls or injuries since last therapy session. She states she has noticed that her vision has gotten worse.    Pertinent History 25 year old female with history of migraines, depression, anxiety, vitamin D deficiency and recent diagnosis of multiple sclerosis relapsing remitting presented to the ED on 09/14/20 for four weeks of left sided and trunk paresthesias and weakness as well a Lhermitte's phenomenon, gait imbalance, blurry vision and pain with eye movements. Outpatient MRI brain and cervical spine was obtained which was concerning for MS with some enhancing lesions. Initial plan was for home health IV steroids, but she presented to the ED after they were not initiated and  she had lack of response to oral steroids. Screening labs revealed a positive quantiferon gold prompting initiation of isoniazid and rifapentine. She was treated with IV steroids with some improvement in symptoms. At her first clinic visit we planned to initiate Tysabri due to active MS with spinal cord involvement. Tysabri was started on 12/16 and she unfortunately experienced new symptoms of left eye pain and blurry vision two weeks ago. Therefore there is concern for a clinical relapse prior to therapy. She was referred to physical therapy for gait. MRI C/T spine w/ and w/out contract on 09/15/20 was positive for the following: Hyperintense lesion within the spinal cord at C2-C3 with enhancement concerning for active demyelination.    Limitations Sitting;Reading;Lifting;Standing;Walking    How long can you sit comfortably? 1 hour    How long can you stand comfortably? 10-30 mins    How long can you walk comfortably? short distances with Gonet/cane for 1 hour    Patient Stated Goals "I want to get stronger so I can play with my kids"    Currently in Pain? Yes    Pain Score 7     Pain Location Back    Pain Descriptors / Indicators Aching    Pain Onset Today            Nu Step x L1 x 3 mins; patient terminated exercise due to increased burning sensation going down legs  There Ex: All exercises are completed with 2# ankle weights: Seated BLE heel raises/toe raises x 2 10 rep  Seated marches x 2 10 reps each leg Seated LAQ x 2 10 reps each leg Seated crossovers with round bolster x 2 10 reps each leg Standing hip abduction x 2 10 reps each leg Standing hamstring curls x 2 10 reps each leg   Sit to stands x 10, x 7, x 5 without BUE support  Step ups on stairs with BUE support x forward/sideways x 10 reps each leg               PT Short Term Goals - 12/23/20 1826      PT SHORT TERM GOAL #1   Title Patient will be independent in home exercise program to improve  strength/mobility for better functional independence with ADLs.    Time 6    Period Weeks    Status New    Target Date 02/03/21             PT Long Term Goals - 12/23/20 1827      PT LONG TERM GOAL #1   Title Patient will increase FOTO score to equal to or greater than 57 to demonstrate statistically significant improvement in mobility and quality of life.    Baseline 01/12: 48    Time 12    Period Weeks    Status New    Target Date 03/17/21      PT LONG TERM GOAL #2   Title Patient (< 32 years old) will complete five times sit to stand test in < 10 seconds indicating an increased LE strength and improved balance.    Baseline 01/12: 13.98s    Time 12    Period Weeks    Status New    Target Date 03/17/21      PT LONG TERM GOAL #3   Title Patient will increase Berg Balance score by > 6 points to demonstrate decreased fall risk during functional activities.    Baseline 01/12: 48/56    Time 12    Period Weeks    Status New    Target Date 03/17/21      PT LONG TERM GOAL #4   Title Patient will increase six minute walk test distance to >1000 for progression to community ambulator and improve gait ability    Baseline 01/12: 915 feet without AD    Time 12    Period Weeks    Status New    Target Date 03/17/21                 Plan - 01/06/21 1627    Clinical Impression Statement Patient completed strengthening exercises with fair activity to tolerance. She demonstrates increased muscle fatigue after second set of each exercises as evidenced by frequent seated therapeutic rest breaks. Patient will continue to benefit from skilled physical therapy to improve generalized strength, ROM, and capacity for functional activity.    Personal Factors and Comorbidities Comorbidity 3+;Time since onset of injury/illness/exacerbation;Fitness    Comorbidities migraines, depression, anxiety, vitamin D deficiency, hypercholestermia, and recent diagnosis of multiple sclerosis relapsing  remitting    Examination-Activity Limitations Stairs;Stand;Sit;Bend;Lift    Examination-Participation Restrictions School;Community Activity    Stability/Clinical Decision Making Evolving/Moderate complexity    Rehab Potential Fair    PT Frequency 2x / week    PT Duration 12 weeks    PT Treatment/Interventions ADLs/Self Care Home Management;Aquatic Therapy;Biofeedback;Gait training;Stair training;Functional mobility training;Therapeutic activities;Therapeutic exercise;Balance training;Neuromuscular re-education;Patient/family  education;Manual techniques;Passive range of motion;Joint Manipulations;Spinal Manipulations;Energy conservation    PT Next Visit Plan Initiate BLE strengthening    PT Home Exercise Plan Initiate HEP    Consulted and Agree with Plan of Care Patient           Patient will benefit from skilled therapeutic intervention in order to improve the following deficits and impairments:  Abnormal gait,Decreased activity tolerance,Decreased endurance,Decreased strength,Impaired sensation,Decreased balance,Decreased coordination,Decreased mobility,Decreased safety awareness,Difficulty walking,Increased muscle spasms  Visit Diagnosis: Muscle weakness (generalized)  Unsteadiness on feet  Other abnormalities of gait and mobility  Multiple sclerosis, relapsing-remitting (HCC)  Other muscle spasm  Abnormal posture     Problem List Patient Active Problem List   Diagnosis Date Noted  . 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 Vitamin D deficiency 10/09/2019  . Generalized anxiety disorder 05/02/2019  . Major depressive disorder, recurrent episode, moderate (HCC) 05/02/2019  . Anxiety and depression 04/23/2019  . History of preterm delivery 02/18/2019  . History of gestational hypertension 02/18/2019   Jillyn Hidden PT, DPT Amelia Jo 01/06/2021, 4:44 PM  [facility] MAIN [facility] SERVICES [address] [place], [place], [address] Phone: [phone]   Fax:  [phone]  Name: WILLYE JAVIER MRN: [id number] Date of Birth: 1996-03-07

## 2021-01-07 ENCOUNTER — Other Ambulatory Visit: Payer: Self-pay

## 2021-01-07 ENCOUNTER — Ambulatory Visit
Admission: RE | Admit: 2021-01-07 | Discharge: 2021-01-07 | Disposition: A | Discharge status: Home / Self Care | Payer: Medicaid Other | Source: Ambulatory Visit | Attending: Neurology | Admitting: Neurology

## 2021-01-07 DIAGNOSIS — G35 Multiple sclerosis: Secondary | ICD-10-CM

## 2021-01-07 MED ORDER — GADOBUTROL 1 MMOL/ML IV SOLN
10.0000 mL | Freq: Once | INTRAVENOUS | Status: AC | PRN
  Administered 2021-01-07: 10 mL via INTRAVENOUS

## 2021-01-11 ENCOUNTER — Ambulatory Visit: Payer: Medicaid Other

## 2021-01-12 ENCOUNTER — Ambulatory Visit: Payer: Medicaid Other | Admitting: Licensed Clinical Social Worker

## 2021-01-13 ENCOUNTER — Ambulatory Visit: Payer: Medicaid Other

## 2021-01-15 ENCOUNTER — Ambulatory Visit: Payer: Medicaid Other | Admitting: Licensed Clinical Social Worker

## 2021-01-15 DIAGNOSIS — F411 Generalized anxiety disorder: Secondary | ICD-10-CM

## 2021-01-15 DIAGNOSIS — F331 Major depressive disorder, recurrent, moderate: Secondary | ICD-10-CM

## 2021-01-15 NOTE — Progress Notes (Signed)
Counselor/Therapist Progress Note  Patient ID: Brenda Fuentes, MRN: [id number],    Date: 01/15/2021  Time Spent: 51 minutes   Treatment Type: Psychotherapy  Reported Symptoms: increased energy, mood stability, managed anxiety   Mental Status Exam:  Appearance:   NA     Behavior:  Appropriate and Sharing  Motor:  Normal and NA  Speech/Language:   Normal Rate  Affect:  NA  Mood:  euthymic  Thought process:  normal  Thought content:    WNL  Sensory/Perceptual disturbances:    WNL  Orientation:  oriented to person, place, time/date and situation  Attention:  Good  Concentration:  Good  Memory:  WNL  Fund of knowledge:   Good  Insight:    Fair  Judgment:   Good  Impulse Control:  Good   Risk Assessment: Danger to Self:  No Self-injurious Behavior: No Danger to Others: No Duty to Warn:no Physical Aggression / Violence:No  Access to Firearms a concern: No  Gang Involvement:No   Subjective: Patient was engaged and cooperative throughout the session using time effectively to discuss thoughts and feelings. Patient voices continued motivation for treatment and understanding of depression and anxiety. Patient is likely to benefit from future treatment because she remains motivated to decrease symptoms and improve functioning and reports benefit of regular sessions. Patient started new medication - Effexor yesterday.    Interventions: Cognitive Behavioral Therapy Established psychological safety. Checked in with patient regarding her week. Engaged patient in processing current psychosocial stressors - overall positive mood; challenges with managing diet; concerns about friends. Explored patient's challenges with eating, and discussed options of improving diet. Taught patient mindfulness eating and encouraged patient to try at least once daily.  Provided space for patient to verbally ventilate emotional distress related to empathy for friends challenges, assisting patient in identifying  what she has control over and what makes a supportive friend. Provided support through active listening, validation of feelings, and highlighted patient's strengths.   Diagnosis:   ICD-10-CM   1. Generalized anxiety disorder  F41.1   2. Major depressive disorder, recurrent episode, moderate (HCC)  F33.1    Plan: Check-in on mindfulness eating.   Patient's goal: to be in a better head space, figure out who she is as a person, to be more confident, to do more for herself, find herself.  Increase sense of self -"who am I"  Values clarification   Assist patient in developing goal and value directions   Explore and teach problem solving skills  Assertiveness communication  Monitor progress and discuss barriers   Increase mood regulationand decrease anxiety  Questioning and challenging thoughts  Cognitive restructuring   Mindfulness strategies  Help patient to develop reality-based, positive cognitive messages  Calming techniques PMR and deep breathing  Future Appointments  Date Time Provider Department Center  01/18/2021  3:15 PM [facility] PT SUB THERAPIST 1 [facility] None  01/20/2021  3:15 PM [facility] PT SUB THERAPIST 1 [facility] None  01/25/2021  1:00 PM Kathreen Cosier, LCSW [facility] None  01/25/2021  3:15 PM Temple Pacini R, PT [facility] None  01/26/2021  2:15 PM [facility] NURSE [facility] None  01/27/2021  3:15 PM Baird Kay, PT [facility] None  02/01/2021  4:00 PM Baird Kay, PT [facility] None  02/03/2021  4:00 PM Baird Kay, PT [facility] None    Kathreen Cosier, LCSW

## 2021-01-18 ENCOUNTER — Ambulatory Visit: Payer: Medicaid Other

## 2021-01-20 ENCOUNTER — Other Ambulatory Visit: Payer: Self-pay

## 2021-01-20 ENCOUNTER — Ambulatory Visit: Payer: Medicaid Other | Attending: Neurology | Admitting: Physical Therapy

## 2021-01-20 DIAGNOSIS — M62838 Other muscle spasm: Secondary | ICD-10-CM | POA: Diagnosis present

## 2021-01-20 DIAGNOSIS — R2681 Unsteadiness on feet: Secondary | ICD-10-CM | POA: Insufficient documentation

## 2021-01-20 DIAGNOSIS — G35 Multiple sclerosis: Secondary | ICD-10-CM | POA: Insufficient documentation

## 2021-01-20 DIAGNOSIS — R2689 Other abnormalities of gait and mobility: Secondary | ICD-10-CM | POA: Insufficient documentation

## 2021-01-20 DIAGNOSIS — M6281 Muscle weakness (generalized): Secondary | ICD-10-CM | POA: Diagnosis not present

## 2021-01-20 DIAGNOSIS — R293 Abnormal posture: Secondary | ICD-10-CM | POA: Diagnosis present

## 2021-01-20 NOTE — Therapy (Signed)
[facility] MAIN [facility] SERVICES [address] [place], [place], [address] Phone: [phone]   Fax:  [phone]  Physical Therapy Treatment  Patient Details  Name: Brenda Fuentes MRN: [id number] Date of Birth: [birth year] Referring Provider (PT): Marian Sorrow   Encounter Date: 01/20/2021   PT End of Session - 01/20/21 1539    Visit Number 3    Number of Visits 25    Date for PT Re-Evaluation 03/17/21    Authorization Type eval: 01/12    PT Start Time 1521    PT Stop Time 1610    PT Time Calculation (min) 49 min    Equipment Utilized During Treatment Gait belt    Activity Tolerance Patient tolerated treatment well;Patient limited by fatigue;Patient limited by pain    Behavior During Therapy WFL for tasks assessed/performed           Past Medical History:  Diagnosis Date  . Anxiety   . Depression 2016  . Gestational hypertension   . Hypercholesteremia 2009    Past Surgical History:  Procedure Laterality Date  . NO PAST SURGERIES      There were no vitals filed for this visit.   Subjective Assessment - 01/20/21 1600    Subjective Patient reports that today is one of the "bad" days. She is getting her 3rd infusion tomorrow and she states that the week before her infusion is always "rough" on her. Patient reports that she has been unable to attend PT sessions due to being sick and conflicting schedule with other doctor appointments.    Pertinent History 25 year old female with history of migraines, depression, anxiety, vitamin D deficiency and recent diagnosis of multiple sclerosis relapsing remitting presented to the ED on 09/14/20 for four weeks of left sided and trunk paresthesias and weakness as well a Lhermitte's phenomenon, gait imbalance, blurry vision and pain with eye movements. Outpatient MRI brain and cervical spine was obtained which was concerning for MS with some enhancing lesions. Initial plan was for home health IV steroids,  but she presented to the ED after they were not initiated and she had lack of response to oral steroids. Screening labs revealed a positive quantiferon gold prompting initiation of isoniazid and rifapentine. She was treated with IV steroids with some improvement in symptoms. At her first clinic visit we planned to initiate Tysabri due to active MS with spinal cord involvement. Tysabri was started on 12/16 and she unfortunately experienced new symptoms of left eye pain and blurry vision two weeks ago. Therefore there is concern for a clinical relapse prior to therapy. She was referred to physical therapy for gait. MRI C/T spine w/ and w/out contract on 09/15/20 was positive for the following: Hyperintense lesion within the spinal cord at C2-C3 with enhancement concerning for active demyelination.    Limitations Sitting;Reading;Lifting;Standing;Walking    How long can you sit comfortably? 1 hour    How long can you stand comfortably? 10-30 mins    How long can you walk comfortably? short distances with Leavy/cane for 1 hour    Patient Stated Goals "I want to get stronger so I can play with my kids"    Currently in Pain? Yes    Pain Score 7     Pain Location Back    Pain Descriptors / Indicators Crying    Pain Type Chronic pain    Pain Radiating Towards down RLE    Pain Onset Today  Nu Step x L0 x 5 mins with no UE movement; reports of BLE numbness  Standing hip abduction x 10 reps each LE  Seated LAQ x 2 15 reps  Seated marches x 2 10 reps  Seated hip adduction squeezes x 2 10 reps with 3-5s holds Seated clamshells with GTB x 2 10 reps  Seated heel/toe raises x 2 10 reps       PT Education - 01/20/21 1631    Education Details HEP: SLR, seated marches, heel/toe raises, seated LAQ    Person(s) Educated Patient    Methods Explanation;Demonstration;Handout    Comprehension Verbalized understanding            PT Short Term Goals - 12/23/20 1826      PT SHORT TERM GOAL #1    Title Patient will be independent in home exercise program to improve strength/mobility for better functional independence with ADLs.    Time 6    Period Weeks    Status New    Target Date 02/03/21             PT Long Term Goals - 12/23/20 1827      PT LONG TERM GOAL #1   Title Patient will increase FOTO score to equal to or greater than 57 to demonstrate statistically significant improvement in mobility and quality of life.    Baseline 01/12: 48    Time 12    Period Weeks    Status New    Target Date 03/17/21      PT LONG TERM GOAL #2   Title Patient (< 60 years old) will complete five times sit to stand test in < 10 seconds indicating an increased LE strength and improved balance.    Baseline 01/12: 13.98s    Time 12    Period Weeks    Status New    Target Date 03/17/21      PT LONG TERM GOAL #3   Title Patient will increase Berg Balance score by > 6 points to demonstrate decreased fall risk during functional activities.    Baseline 01/12: 48/56    Time 12    Period Weeks    Status New    Target Date 03/17/21      PT LONG TERM GOAL #4   Title Patient will increase six minute walk test distance to >1000 for progression to community ambulator and improve gait ability    Baseline 01/12: 915 feet without AD    Time 12    Period Weeks    Status New    Target Date 03/17/21                 Plan - 01/20/21 1631    Clinical Impression Statement Patient completed strengthening exercises with increased pain with some activity going down her RLE. She states she has increased muscle spasms in her lower back when she starts to activate hip flexion in her RLE. She demonstrates increased symptoms along BLE and reports of it resembling of "sciatic" pain. She requires verbal and visual cues for proper positioning of hip and knees for completing exercises. Patient reports of indescribable pain in her lower back and she thinks it's from having two epidurals from giving birth to  her children. Therapist educated patient on referring to her PCP for follow up on LBP. Patient would benefit from continued PT services to increase strength and mobility to improve patient's quality of life.    Personal Factors and Comorbidities Comorbidity 3+;Time since  onset of injury/illness/exacerbation;Fitness    Comorbidities migraines, depression, anxiety, vitamin D deficiency, hypercholestermia, and recent diagnosis of multiple sclerosis relapsing remitting    Examination-Activity Limitations Stairs;Stand;Sit;Bend;Lift    Examination-Participation Restrictions School;Community Activity    Stability/Clinical Decision Making Evolving/Moderate complexity    Rehab Potential Fair    PT Frequency 2x / week    PT Duration 12 weeks    PT Treatment/Interventions ADLs/Self Care Home Management;Aquatic Therapy;Biofeedback;Gait training;Stair training;Functional mobility training;Therapeutic activities;Therapeutic exercise;Balance training;Neuromuscular re-education;Patient/family education;Manual techniques;Passive range of motion;Joint Manipulations;Spinal Manipulations;Energy conservation    PT Next Visit Plan Weight bearing strengthening exercises within patient's tolerance.    Consulted and Agree with Plan of Care Patient           Patient will benefit from skilled therapeutic intervention in order to improve the following deficits and impairments:  Abnormal gait,Decreased activity tolerance,Decreased endurance,Decreased strength,Impaired sensation,Decreased balance,Decreased coordination,Decreased mobility,Decreased safety awareness,Difficulty walking,Increased muscle spasms  Visit Diagnosis: Muscle weakness (generalized)  Unsteadiness on feet  Other abnormalities of gait and mobility  Multiple sclerosis, relapsing-remitting (HCC)  Other muscle spasm  Abnormal posture     Problem List Patient Active Problem List   Diagnosis Date Noted  . Reaction to cell-mediated gamma  interferon antigen response test without active tuberculosis 09/22/2020  . Vitamin D deficiency 10/09/2019  . Generalized anxiety disorder 05/02/2019  . Major depressive disorder, recurrent episode, moderate (HCC) 05/02/2019  . Anxiety and depression 04/23/2019  . History of preterm delivery 02/18/2019  . History of gestational hypertension 02/18/2019   Jillyn Hidden PT, DPT Amelia Jo 01/20/2021, 4:37 PM  [place] [facility] MAIN [facility] SERVICES [address] [place], [place], [address] Phone: [phone]   Fax:  [phone]  Name: Brenda Fuentes MRN: [id number] Date of Birth: August 27, 1996

## 2021-01-25 ENCOUNTER — Other Ambulatory Visit: Payer: Self-pay

## 2021-01-25 ENCOUNTER — Ambulatory Visit: Payer: Medicaid Other | Admitting: Licensed Clinical Social Worker

## 2021-01-25 ENCOUNTER — Ambulatory Visit: Payer: Medicaid Other

## 2021-01-25 DIAGNOSIS — R2681 Unsteadiness on feet: Secondary | ICD-10-CM

## 2021-01-25 DIAGNOSIS — M6281 Muscle weakness (generalized): Secondary | ICD-10-CM | POA: Diagnosis not present

## 2021-01-25 DIAGNOSIS — R2689 Other abnormalities of gait and mobility: Secondary | ICD-10-CM

## 2021-01-25 DIAGNOSIS — F331 Major depressive disorder, recurrent, moderate: Secondary | ICD-10-CM

## 2021-01-25 DIAGNOSIS — F411 Generalized anxiety disorder: Secondary | ICD-10-CM

## 2021-01-25 NOTE — Progress Notes (Signed)
Counselor/Therapist Progress Note  Patient ID: Brenda Fuentes, MRN: [id number],    Date: 01/25/2021  Time Spent: 49 minutes   Treatment Type: Psychotherapy  Reported Symptoms: Obsessive thinking, Appetite disturbance, Physical aches and pain and anxiety, anxious thoughts, irritability; low mood  Mental Status Exam:  Appearance:   NA     Behavior:  Appropriate and Sharing  Motor:  NA  Speech/Language:   Normal Rate  Affect:  NA  Mood:  normal  Thought process:  normal  Thought content:    WNL  Sensory/Perceptual disturbances:    WNL  Orientation:  oriented to person, place, time/date and situation  Attention:  Good  Concentration:  Good  Memory:  WNL  Fund of knowledge:   Good  Insight:    Fair  Judgment:   Fair  Impulse Control:  Good   Risk Assessment: Danger to Self:  No Self-injurious Behavior: No Danger to Others: No Duty to Warn:no Physical Aggression / Violence:No  Access to Firearms a concern: No  Gang Involvement:No   Subjective: Patient was engaged and cooperative throughout the session using time effectively to discuss thoughts and feelings. Patient voices continued motivation for treatment and understanding of depression and anxiety. Patient is likely to benefit from future treatment because she remains motivated to decrease symptoms and improve functioning.    Interventions: Cognitive Behavioral Therapy and Mindfulness Meditation Established psychological safety. Checked in with patient regarding her week. Engaged patient in processing current psychosocial stressors, validating patient's feelings of concern and frustration. Reviewed previous session regarding mindful eating. Discussed use of deep breathing, assisting patient in identifying a plan for reminding herself to breath. Provided support through active listening, validation of feelings, and highlighted patient's strengths.   Diagnosis:   ICD-10-CM   1. Generalized anxiety disorder  F41.1   2.  Major depressive disorder, recurrent episode, moderate (HCC)  F33.1    Plan: Patient's goal: to be in a better head space, figure out who she is as a person, to be more confident, to do more for herself, find herself.  Increase sense of self -"who am I"  Values clarification   Assist patient in developing goal and value directions   Explore and teach problem solving skills  Assertiveness communication  Monitor progress and discuss barriers   Increase mood regulationand decrease anxiety  Questioning and challenging thoughts  Cognitive restructuring   Mindfulness strategies  Help patient to develop reality-based, positive cognitive messages  Calming techniques PMR and deep breathing  Future Appointments  Date Time Provider Department Center  01/25/2021  3:15 PM Baird Kay, PT [facility] None  01/26/2021  2:15 PM [facility] NURSE [facility] None  01/27/2021  3:15 PM Baird Kay, PT [facility] None  02/01/2021  4:00 PM Baird Kay, PT [facility] None  02/02/2021  2:00 PM Kathreen Cosier, LCSW [facility] None  02/03/2021  4:00 PM Baird Kay, PT [facility] None    Kathreen Cosier, LCSW

## 2021-01-25 NOTE — Therapy (Signed)
[place] [facility] MAIN [facility] SERVICES [address] [place], [place], [address] Phone: [phone]   Fax:  [phone]  Physical Therapy Treatment  Patient Details  Name: Brenda Fuentes MRN: [id number] Date of Birth: August 19, 1996 Referring Provider (PT): Marian Sorrow   Encounter Date: 01/25/2021   PT End of Session - 01/25/21 1614    Visit Number 4    Number of Visits 25    Date for PT Re-Evaluation 03/17/21    Authorization Type eval: 01/12    PT Start Time 1524    PT Stop Time 1558    PT Time Calculation (min) 34 min    Equipment Utilized During Treatment Gait belt    Activity Tolerance Patient tolerated treatment well    Behavior During Therapy WFL for tasks assessed/performed           Past Medical History:  Diagnosis Date  . Anxiety   . Depression 2016  . Gestational hypertension   . Hypercholesteremia 2009    Past Surgical History:  Procedure Laterality Date  . NO PAST SURGERIES      There were no vitals filed for this visit.   Subjective Assessment - 01/25/21 1611    Subjective Pt reports 5/10 low back pain and that she is also having neck pain.  Pt says she had a busy week last week and had to put her HEP on hold. Pt reports her LEs are "a little numb today," and she feels her balance is off.    Pertinent History 25 year old female with history of migraines, depression, anxiety, vitamin D deficiency and recent diagnosis of multiple sclerosis relapsing remitting presented to the ED on 09/14/20 for four weeks of left sided and trunk paresthesias and weakness as well a Lhermitte's phenomenon, gait imbalance, blurry vision and pain with eye movements. Outpatient MRI brain and cervical spine was obtained which was concerning for MS with some enhancing lesions. Initial plan was for home health IV steroids, but she presented to the ED after they were not initiated and she had lack of response to oral steroids. Screening labs revealed a  positive quantiferon gold prompting initiation of isoniazid and rifapentine. She was treated with IV steroids with some improvement in symptoms. At her first clinic visit we planned to initiate Tysabri due to active MS with spinal cord involvement. Tysabri was started on 12/16 and she unfortunately experienced new symptoms of left eye pain and blurry vision two weeks ago. Therefore there is concern for a clinical relapse prior to therapy. She was referred to physical therapy for gait. MRI C/T spine w/ and w/out contract on 09/15/20 was positive for the following: Hyperintense lesion within the spinal cord at C2-C3 with enhancement concerning for active demyelination.    Limitations Sitting;Reading;Lifting;Standing;Walking    How long can you sit comfortably? 1 hour    How long can you stand comfortably? 10-30 mins    How long can you walk comfortably? short distances with Jehle/cane for 1 hour    Patient Stated Goals "I want to get stronger so I can play with my kids"    Currently in Pain? Yes    Pain Score 5     Pain Location Back    Pain Orientation Lower    Pain Onset --           Treatment  Nu Step x L1 x 3 mins   Standing hip abduction 2x 12 reps each LE, VC/Demonstration provided  For technique  Forward/backward high-knee marches - 6x pt reports difficulty increased with reps; VC/Demonstration for technique  Seated LAQ x 1x15 BLEs with AW, 1x15 BLEs with 2# AW   Standing hip extension with 2# AW 2x15; Demonstration/VC for technique  Standing hamstring curls with 2# AW - 2x15   Stability ball rollouts - x10 with 3 seconds holds. Pt reports exercise feels "good" on her back.  Seated adductor squeeze 1x12 Pt reports LE spasm with exercise   Assessment: Session limited secondary to patient arriving late to appointment. Pt able to perform more reps today and with increased weight on therex compared to previous session, indicating increased BLE strength. Pt's low back pain improved  with stability ball rollouts. Pt will benefit from further skilled therapy to improve BLE strength, ROM and capacity for functional activity.       PT Education - 01/25/21 1613    Education Details Standing hip extension and hamstring curls, bodymechanics.    Person(s) Educated Patient    Methods Explanation;Demonstration;Verbal cues    Comprehension Returned demonstration;Verbalized understanding            PT Short Term Goals - 12/23/20 1826      PT SHORT TERM GOAL #1   Title Patient will be independent in home exercise program to improve strength/mobility for better functional independence with ADLs.    Time 6    Period Weeks    Status New    Target Date 02/03/21             PT Long Term Goals - 12/23/20 1827      PT LONG TERM GOAL #1   Title Patient will increase FOTO score to equal to or greater than 57 to demonstrate statistically significant improvement in mobility and quality of life.    Baseline 01/12: 48    Time 12    Period Weeks    Status New    Target Date 03/17/21      PT LONG TERM GOAL #2   Title Patient (< 23 years old) will complete five times sit to stand test in < 10 seconds indicating an increased LE strength and improved balance.    Baseline 01/12: 13.98s    Time 12    Period Weeks    Status New    Target Date 03/17/21      PT LONG TERM GOAL #3   Title Patient will increase Berg Balance score by > 6 points to demonstrate decreased fall risk during functional activities.    Baseline 01/12: 48/56    Time 12    Period Weeks    Status New    Target Date 03/17/21      PT LONG TERM GOAL #4   Title Patient will increase six minute walk test distance to >1000 for progression to community ambulator and improve gait ability    Baseline 01/12: 915 feet without AD    Time 12    Period Weeks    Status New    Target Date 03/17/21                 Plan - 01/25/21 1704    Clinical Impression Statement Session limited secondary to patient  arriving late to appointment. Pt able to perform more reps today and with increased weight on therex compared to previous session, indicating increased BLE strength. Pt's low back pain improved with stability ball rollouts. Pt will benefit from further skilled therapy to improve BLE strength, ROM and capacity for functional activity.  Personal Factors and Comorbidities Comorbidity 3+;Time since onset of injury/illness/exacerbation;Fitness    Comorbidities migraines, depression, anxiety, vitamin D deficiency, hypercholestermia, and recent diagnosis of multiple sclerosis relapsing remitting    Examination-Activity Limitations Stairs;Stand;Sit;Bend;Lift    Examination-Participation Restrictions School;Community Activity    Stability/Clinical Decision Making Evolving/Moderate complexity    Rehab Potential Fair    PT Frequency 2x / week    PT Duration 12 weeks    PT Treatment/Interventions ADLs/Self Care Home Management;Aquatic Therapy;Biofeedback;Gait training;Stair training;Functional mobility training;Therapeutic activities;Therapeutic exercise;Balance training;Neuromuscular re-education;Patient/family education;Manual techniques;Passive range of motion;Joint Manipulations;Spinal Manipulations;Energy conservation    PT Next Visit Plan Weight bearing strengthening exercises within patient's tolerance; balance    Consulted and Agree with Plan of Care Patient           Patient will benefit from skilled therapeutic intervention in order to improve the following deficits and impairments:  Abnormal gait,Decreased activity tolerance,Decreased endurance,Decreased strength,Impaired sensation,Decreased balance,Decreased coordination,Decreased mobility,Decreased safety awareness,Difficulty walking,Increased muscle spasms  Visit Diagnosis: Muscle weakness (generalized)  Unsteadiness on feet  Other abnormalities of gait and mobility     Problem List Patient Active Problem List   Diagnosis Date  Noted  . 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 Vitamin D deficiency 10/09/2019  . Generalized anxiety disorder 05/02/2019  . Major depressive disorder, recurrent episode, moderate (HCC) 05/02/2019  . Anxiety and depression 04/23/2019  . History of preterm delivery 02/18/2019  . History of gestational hypertension 02/18/2019   Temple Pacini PT, DPT 01/25/2021, 5:05 PM  [place] [facility] MAIN [facility] SERVICES [address] [place], [place], [address] Phone: [phone]   Fax:  [phone]  Name: ELIZEBETH KLUESNER MRN: [id number] Date of Birth: 1996/08/04

## 2021-01-27 ENCOUNTER — Other Ambulatory Visit: Payer: Self-pay

## 2021-01-27 ENCOUNTER — Ambulatory Visit: Payer: Medicaid Other

## 2021-01-27 DIAGNOSIS — M6281 Muscle weakness (generalized): Secondary | ICD-10-CM | POA: Diagnosis not present

## 2021-01-27 DIAGNOSIS — R2689 Other abnormalities of gait and mobility: Secondary | ICD-10-CM

## 2021-01-27 DIAGNOSIS — R2681 Unsteadiness on feet: Secondary | ICD-10-CM

## 2021-01-27 NOTE — Therapy (Signed)
[place] [facility] MAIN [facility] SERVICES [address] [place], [place], [address] Phone: [phone]   Fax:  [phone]  Physical Therapy Treatment  Patient Details  Name: Brenda Fuentes MRN: [id number] Date of Birth: 02/09/1996 Referring Provider (PT): Marian Sorrow   Encounter Date: 01/27/2021   PT End of Session - 01/27/21 1713    Visit Number 5    Number of Visits 25    Date for PT Re-Evaluation 03/17/21    Authorization Type eval: 01/12    PT Start Time 1515    PT Stop Time 1559    PT Time Calculation (min) 44 min    Equipment Utilized During Treatment Gait belt    Activity Tolerance Patient tolerated treatment well    Behavior During Therapy [facility] for tasks assessed/performed           Past Medical History:  Diagnosis Date  . Anxiety   . Depression 2016  . Gestational hypertension   . Hypercholesteremia 2009    Past Surgical History:  Procedure Laterality Date  . NO PAST SURGERIES      There were no vitals filed for this visit.   Subjective Assessment - 01/27/21 1518    Subjective Pt said her legs felt a little weak after last session. Pt feels "unbalanced" today. Pt reports some pain in her neck and low back but does not provide pain rating.    Pertinent History 25 year old female with history of migraines, depression, anxiety, vitamin D deficiency and recent diagnosis of multiple sclerosis relapsing remitting presented to the ED on 09/14/20 for four weeks of left sided and trunk paresthesias and weakness as well a Lhermitte's phenomenon, gait imbalance, blurry vision and pain with eye movements. Outpatient MRI brain and cervical spine was obtained which was concerning for MS with some enhancing lesions. Initial plan was for home health IV steroids, but she presented to the ED after they were not initiated and she had lack of response to oral steroids. Screening labs revealed a positive quantiferon gold prompting initiation of isoniazid  and rifapentine. She was treated with IV steroids with some improvement in symptoms. At her first clinic visit we planned to initiate Tysabri due to active MS with spinal cord involvement. Tysabri was started on 12/16 and she unfortunately experienced new symptoms of left eye pain and blurry vision two weeks ago. Therefore there is concern for a clinical relapse prior to therapy. She was referred to physical therapy for gait. MRI C/T spine w/ and w/out contract on 09/15/20 was positive for the following: Hyperintense lesion within the spinal cord at C2-C3 with enhancement concerning for active demyelination.    Limitations Sitting;Reading;Lifting;Standing;Walking    How long can you sit comfortably? 1 hour    How long can you stand comfortably? 10-30 mins    How long can you walk comfortably? short distances with Stanczyk/cane for 1 hour    Patient Stated Goals "I want to get stronger so I can play with my kids"    Currently in Pain? Yes    Pain Location Neck            Treatment:   Nustep level 1, x 5 minutes (unbilled)  Neuromuscular Re-ed  Stairs ascend/descend 4x with B UE support. Initial 2x pt demonstrated step-to pattern both ascending descending. For next two sets pt with reciprocal pattern ascending and intermittent step-to/reciprocal with descending.  2x without UEs on rails, pt with reciprocal ascending pattern, and step-to descending, decreased speed.  CGA was provided throughout.  Step-ups at bar (emphasis on SLB) 1x15 with B UE support, CGA.  1x15 without UE support, with CGA, pt demonstrates mid-guard strategy.   Airex pad: WBOS EO 2x30 sec, CGA NBOS EO 2x30 sec, CGA WBOS EC 3x30 sec, intermittent UE support. Pt reports dizziness that resolved a few minutes after she discontinued this exercise.  Tandem stance B LEs  2x30 sec each, intermittent UE support, CGA.   Korebalance: CGA provided throughout [place] race 3x from BUE support to UUE support.  Maze - 3x UUE  support   Assessment: Pt with decreased postural stability with airex pad exercises WBOS EC and tandem stance, requiring intermittent UE support on bar. Pt did have increase in dizziness sx with EC on airex pad that resolved a few minutes after exercise complete. Pt will benefit from further skilled therapy to improve BLE strength and balance to increase ease with ADLS and QOL.     PT Short Term Goals - 12/23/20 1826      PT SHORT TERM GOAL #1   Title Patient will be independent in home exercise program to improve strength/mobility for better functional independence with ADLs.    Time 6    Period Weeks    Status New    Target Date 02/03/21             PT Long Term Goals - 12/23/20 1827      PT LONG TERM GOAL #1   Title Patient will increase FOTO score to equal to or greater than 57 to demonstrate statistically significant improvement in mobility and quality of life.    Baseline 01/12: 48    Time 12    Period Weeks    Status New    Target Date 03/17/21      PT LONG TERM GOAL #2   Title Patient (< 57 years old) will complete five times sit to stand test in < 10 seconds indicating an increased LE strength and improved balance.    Baseline 01/12: 13.98s    Time 12    Period Weeks    Status New    Target Date 03/17/21      PT LONG TERM GOAL #3   Title Patient will increase Berg Balance score by > 6 points to demonstrate decreased fall risk during functional activities.    Baseline 01/12: 48/56    Time 12    Period Weeks    Status New    Target Date 03/17/21      PT LONG TERM GOAL #4   Title Patient will increase six minute walk test distance to >1000 for progression to community ambulator and improve gait ability    Baseline 01/12: 915 feet without AD    Time 12    Period Weeks    Status New    Target Date 03/17/21                 Plan - 01/27/21 1722    Clinical Impression Statement Pt with decreased postural stability with airex pad exercises WBOS EC and  tandem stance, requiring intermittent UE support on bar. Pt did have increase in dizziness sx with EC on airex pad that resolved a few minutes after exercise complete. Pt will benefit from further skilled therapy to improve BLE strength and balance to increase ease with ADLS and QOL.    Personal Factors and Comorbidities Comorbidity 3+;Time since onset of injury/illness/exacerbation;Fitness    Comorbidities migraines, depression, anxiety, vitamin D deficiency, hypercholestermia,  and recent diagnosis of multiple sclerosis relapsing remitting    Examination-Activity Limitations Stairs;Stand;Sit;Bend;Lift    Examination-Participation Restrictions School;Community Activity    Stability/Clinical Decision Making Evolving/Moderate complexity    Rehab Potential Fair    PT Frequency 2x / week    PT Duration 12 weeks    PT Treatment/Interventions ADLs/Self Care Home Management;Aquatic Therapy;Biofeedback;Gait training;Stair training;Functional mobility training;Therapeutic activities;Therapeutic exercise;Balance training;Neuromuscular re-education;Patient/family education;Manual techniques;Passive range of motion;Joint Manipulations;Energy conservation    PT Next Visit Plan Weight bearing strengthening exercises within patient's tolerance; balance    Consulted and Agree with Plan of Care Patient           Patient will benefit from skilled therapeutic intervention in order to improve the following deficits and impairments:  Abnormal gait,Decreased activity tolerance,Decreased endurance,Decreased strength,Impaired sensation,Decreased balance,Decreased coordination,Decreased mobility,Decreased safety awareness,Difficulty walking,Increased muscle spasms  Visit Diagnosis: Muscle weakness (generalized)  Unsteadiness on feet  Other abnormalities of gait and mobility     Problem List Patient Active Problem List   Diagnosis Date Noted  . Reaction to cell-mediated gamma interferon antigen response test  without active tuberculosis 09/22/2020  . Vitamin D deficiency 10/09/2019  . Generalized anxiety disorder 05/02/2019  . Major depressive disorder, recurrent episode, moderate (HCC) 05/02/2019  . Anxiety and depression 04/23/2019  . History of preterm delivery 02/18/2019  . History of gestational hypertension 02/18/2019   Temple Pacini PT, DPT 01/27/2021, 5:24 PM  [place] [facility] MAIN [facility] SERVICES [address] [place], [place], [address] Phone: [phone]   Fax:  [phone]  Name: Brenda Fuentes MRN: [id number] Date of Birth: 1996-09-08

## 2021-02-01 ENCOUNTER — Ambulatory Visit: Payer: Medicaid Other

## 2021-02-02 ENCOUNTER — Ambulatory Visit: Payer: Medicaid Other | Admitting: Licensed Clinical Social Worker

## 2021-02-03 ENCOUNTER — Ambulatory Visit: Payer: Medicaid Other

## 2021-02-03 ENCOUNTER — Other Ambulatory Visit: Payer: Self-pay

## 2021-02-03 DIAGNOSIS — M62838 Other muscle spasm: Secondary | ICD-10-CM

## 2021-02-03 DIAGNOSIS — R2681 Unsteadiness on feet: Secondary | ICD-10-CM

## 2021-02-03 DIAGNOSIS — M6281 Muscle weakness (generalized): Secondary | ICD-10-CM

## 2021-02-03 NOTE — Therapy (Signed)
[facility] MAIN [facility] SERVICES [address] [place], [place], [address] Phone: [phone]   Fax:  [phone]  Physical Therapy Treatment  Patient Details  Name: DEJANAE HELSER MRN: [id number] Date of Birth: [birth year] Referring Provider (PT): Marian Sorrow   Encounter Date: 02/03/2021   PT End of Session - 02/03/21 1722    Visit Number 6    Number of Visits 25    Date for PT Re-Evaluation 03/17/21    Authorization Type eval: 01/12    PT Start Time 1602    PT Stop Time 1648    PT Time Calculation (min) 46 min    Equipment Utilized During Treatment Gait belt    Activity Tolerance Patient tolerated treatment well    Behavior During Therapy [facility] for tasks assessed/performed           Past Medical History:  Diagnosis Date  . Anxiety   . Depression 2016  . Gestational hypertension   . Hypercholesteremia 2009    Past Surgical History:  Procedure Laterality Date  . NO PAST SURGERIES      There were no vitals filed for this visit.   Subjective Assessment - 02/03/21 1714    Subjective Pt reports lower back pain that she says is the result of walking a lot shopping. Pt rates low back pain with walking as 7/10. Pt reports increased frequency with sciatic nerve pain. She describes it as "stabbing," in nature. Pt took baclofen last night for her pain. Pt reports pain just below ribs on anteior-lateral trunk that extends slightly posterior-lateral.  She says pain travels to just inferior to L breast. She says this pain has come on insidiously, but states she does have a history of 2-3 fractured ribs on same side but states that this "pain feels different." She says it aches most of the day and "can get to where it feels stabbing and throbbing" and "takes her breath away." Pt reports appointment next Tuesday with her doctor where she is going to discuss this pain.    Pertinent History 25 year old female with history of migraines, depression,  anxiety, vitamin D deficiency and recent diagnosis of multiple sclerosis relapsing remitting presented to the ED on 09/14/20 for four weeks of left sided and trunk paresthesias and weakness as well a Lhermitte's phenomenon, gait imbalance, blurry vision and pain with eye movements. Outpatient MRI brain and cervical spine was obtained which was concerning for MS with some enhancing lesions. Initial plan was for home health IV steroids, but she presented to the ED after they were not initiated and she had lack of response to oral steroids. Screening labs revealed a positive quantiferon gold prompting initiation of isoniazid and rifapentine. She was treated with IV steroids with some improvement in symptoms. At her first clinic visit we planned to initiate Tysabri due to active MS with spinal cord involvement. Tysabri was started on 12/16 and she unfortunately experienced new symptoms of left eye pain and blurry vision two weeks ago. Therefore there is concern for a clinical relapse prior to therapy. She was referred to physical therapy for gait. MRI C/T spine w/ and w/out contract on 09/15/20 was positive for the following: Hyperintense lesion within the spinal cord at C2-C3 with enhancement concerning for active demyelination.    Limitations Sitting;Reading;Lifting;Standing;Walking    How long can you sit comfortably? 1 hour    How long can you stand comfortably? 10-30 mins    How long can you walk comfortably?  short distances with Maciolek/cane for 1 hour    Patient Stated Goals "I want to get stronger so I can play with my kids"    Currently in Pain? Yes    Pain Score 7     Pain Location Back    Pain Orientation Lower    Pain Descriptors / Indicators Stabbing    Pain Radiating Towards into buttock    Multiple Pain Sites Yes    Pain Location Thoracic    Pain Orientation Left    Pain Descriptors / Indicators Aching;Stabbing;Throbbing    Pain Onset 1 to 4 weeks ago    Pain Frequency Intermittent          Treatment:   Nustep level 1, x 5 minutes, no pain reported, cuing for increased SPM Lower trunk rotations 4x20 Knee to chest - 2x10 each LE Bridges - 5x but pain limited, exercise discontinued Piriformis stretch 2x30 sec B; pt reports stretch is "relieving" Stability ball rollouts 3x10 with 2-3 second holds Hamstring stretch 2x30 sec each LE STS 1x8, 1x8 with 8#; pt rates "easy" Deadlifts from modified height with two 8# dumbbells - 3x12; no changes in pain reported   Neuromuscular Re-ed   Korebalance: CGA provided throughout [place] race 3x no UE support, some difficulty with smaller weight-shifts    PT Short Term Goals - 12/23/20 1826      PT SHORT TERM GOAL #1   Title Patient will be independent in home exercise program to improve strength/mobility for better functional independence with ADLs.    Time 6    Period Weeks    Status New    Target Date 02/03/21             PT Long Term Goals - 12/23/20 1827      PT LONG TERM GOAL #1   Title Patient will increase FOTO score to equal to or greater than 57 to demonstrate statistically significant improvement in mobility and quality of life.    Baseline 01/12: 48    Time 12    Period Weeks    Status New    Target Date 03/17/21      PT LONG TERM GOAL #2   Title Patient (< 68 years old) will complete five times sit to stand test in < 10 seconds indicating an increased LE strength and improved balance.    Baseline 01/12: 13.98s    Time 12    Period Weeks    Status New    Target Date 03/17/21      PT LONG TERM GOAL #3   Title Patient will increase Berg Balance score by > 6 points to demonstrate decreased fall risk during functional activities.    Baseline 01/12: 48/56    Time 12    Period Weeks    Status New    Target Date 03/17/21      PT LONG TERM GOAL #4   Title Patient will increase six minute walk test distance to >1000 for progression to community ambulator and improve gait ability    Baseline 01/12: 915  feet without AD    Time 12    Period Weeks    Status New    Target Date 03/17/21                 Plan - 02/03/21 1708    Clinical Impression Statement Pt presents with increased low back pain this session. Pt did demonstrate some centralizing with flexion based exercises and reported some relief with  pififormis stretch and stability ball rollouts. Pt presents in session with some L anterior-lateral to slightly posterior trunk pain just inferior to ribs that is tender to palpation that pt describes as stabbing in nature that has been occuring over a few weeks intermittently with no identifiable relieving factors reported. 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 Pt has follow up this Tuesday with her physician and was instructed to discuss these sx with them or to go to urgent care or ED if pain continues to increase. Pt verbalized understanding. Pt will benefit from further skilled therapy to improve BLE strength, balance and activity tolerance to improve QOL.    Personal Factors and Comorbidities Comorbidity 3+;Time since onset of injury/illness/exacerbation;Fitness    Comorbidities migraines, depression, anxiety, vitamin D deficiency, hypercholestermia, and recent diagnosis of multiple sclerosis relapsing remitting    Examination-Activity Limitations Stairs;Stand;Sit;Bend;Lift    Examination-Participation Restrictions School;Community Activity    Stability/Clinical Decision Making Evolving/Moderate complexity    Rehab Potential Fair    PT Frequency 2x / week    PT Duration 12 weeks    PT Treatment/Interventions ADLs/Self Care Home Management;Aquatic Therapy;Biofeedback;Gait training;Stair training;Functional mobility training;Therapeutic activities;Therapeutic exercise;Balance training;Neuromuscular re-education;Patient/family education;Manual techniques;Passive range of motion;Joint Manipulations;Energy conservation    PT Next Visit Plan Weight bearing strengthening exercises within  patient's tolerance; balance; deadlifts within pain-free range    Consulted and Agree with Plan of Care Patient           Patient will benefit from skilled therapeutic intervention in order to improve the following deficits and impairments:  Abnormal gait,Decreased activity tolerance,Decreased endurance,Decreased strength,Impaired sensation,Decreased balance,Decreased coordination,Decreased mobility,Decreased safety awareness,Difficulty walking,Increased muscle spasms  Visit Diagnosis: Muscle weakness (generalized)  Unsteadiness on feet  Other muscle spasm     Problem List Patient Active Problem List   Diagnosis Date Noted  . 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 Vitamin D deficiency 10/09/2019  . Generalized anxiety disorder 05/02/2019  . Major depressive disorder, recurrent episode, moderate (HCC) 05/02/2019  . Anxiety and depression 04/23/2019  . History of preterm delivery 02/18/2019  . History of gestational hypertension 02/18/2019   Temple Pacini PT, DPT 02/03/2021, 5:25 PM  [place] [facility] MAIN [facility] SERVICES [address] [place], [place], [address] Phone: [phone]   Fax:  [phone]  Name: MORANDA BILLIOT MRN: [id number] Date of Birth: November 08, 1996

## 2021-02-03 NOTE — Progress Notes (Signed)
Counselor/Therapist Progress Note  Patient ID: Brenda Fuentes, MRN: [id number],    Date: 02/04/2021  Time Spent: 40 minutes    Treatment Type: Psychotherapy  Reported Symptoms: Obsessive thinking, Sleep disturbance, Appetite disturbance, Fatigue, Physical aches and pain and sadness, worries  Mental Status Exam:  Appearance:   NA     Behavior:  Appropriate and Sharing  Motor:  NA  Speech/Language:   Normal Rate  Affect:  NA  Mood:  dysthymic  Thought process:  normal  Thought content:    WNL  Sensory/Perceptual disturbances:    WNL  Orientation:  oriented to person, place, time/date and situation  Attention:  Fair  Concentration:  Good  Memory:  WNL  Fund of knowledge:   Good  Insight:    Fair  Judgment:   Good  Impulse Control:  Good   Risk Assessment: Danger to Self:  No Self-injurious Behavior: No Danger to Others: No Duty to Warn:no Physical Aggression / Violence:No  Access to Firearms a concern: No  Gang Involvement:No   Subjective: Patient participated and was cooperative throughout the session using time to discuss thoughts and feelings. Patient voices some resistance to practice coping skills but voices continued motivation for treatment and understanding of depression/anxiety. Patient is likely to benefit from future treatment because she remains motivated to decrease symptoms and improve functioning and reports benefit of regular sessions.    Interventions: Cognitive Behavioral Therapy Established psychological safety. Checked in with patient and engaged patient in processing current psychosocial stressors, continued depression and anxiety due to multiple stressors. Explored patient's options for increasing self-care. Provided support through active listening, validation of feelings, and highlighted patient's strengths.   Diagnosis:   ICD-10-CM   1. Generalized anxiety disorder  F41.1   2. Major depressive disorder, recurrent episode, moderate (HCC)  F33.1     Plan: Patient's goal: to be in a better head space, figure out who she is as a person, to be more confident, to do more for herself, find herself.  Increase sense of self -"who am I"  Values clarification   Assist patient in developing goal and value directions   Explore and teach problem solving skills  Assertiveness communication  Monitor progress and discuss barriers   Increase mood regulationand decrease anxiety  Questioning and challenging thoughts  Cognitive restructuring   Mindfulness strategies  Self-care strategies   Help patient to develop reality-based, positive cognitive messages  Calming techniques PMR and deep breathing  Future Appointments  Date Time Provider Department Center  02/08/2021  2:30 PM Baird Kay, PT [facility] None  02/10/2021  4:00 PM Temple Pacini R, PT [facility] None  02/15/2021  3:15 PM Temple Pacini R, PT [facility] None  02/16/2021  2:00 PM Kathreen Cosier, LCSW [facility] None  02/17/2021  4:00 PM Baird Kay, PT [facility] None  02/23/2021  1:45 PM Baird Kay, PT [facility] None  02/25/2021  1:45 PM Baird Kay, PT [facility] None   Kathreen Cosier, LCSW

## 2021-02-04 ENCOUNTER — Other Ambulatory Visit: Payer: Self-pay | Admitting: Internal Medicine

## 2021-02-04 ENCOUNTER — Ambulatory Visit: Payer: Medicaid Other | Admitting: Licensed Clinical Social Worker

## 2021-02-04 DIAGNOSIS — R1084 Generalized abdominal pain: Secondary | ICD-10-CM

## 2021-02-04 DIAGNOSIS — F411 Generalized anxiety disorder: Secondary | ICD-10-CM

## 2021-02-04 DIAGNOSIS — F331 Major depressive disorder, recurrent, moderate: Secondary | ICD-10-CM

## 2021-02-06 ENCOUNTER — Other Ambulatory Visit: Payer: Self-pay

## 2021-02-06 ENCOUNTER — Emergency Department
Admission: EM | Admit: 2021-02-06 | Discharge: 2021-02-06 | Disposition: A | Discharge status: Home / Self Care | Payer: Medicaid Other | Attending: Emergency Medicine | Admitting: Emergency Medicine

## 2021-02-06 ENCOUNTER — Emergency Department: Payer: Medicaid Other

## 2021-02-06 DIAGNOSIS — Z87891 Personal history of nicotine dependence: Secondary | ICD-10-CM | POA: Insufficient documentation

## 2021-02-06 DIAGNOSIS — R10812 Left upper quadrant abdominal tenderness: Secondary | ICD-10-CM | POA: Insufficient documentation

## 2021-02-06 DIAGNOSIS — R109 Unspecified abdominal pain: Secondary | ICD-10-CM | POA: Diagnosis present

## 2021-02-06 DIAGNOSIS — R10813 Right lower quadrant abdominal tenderness: Secondary | ICD-10-CM | POA: Diagnosis not present

## 2021-02-06 LAB — COMPREHENSIVE METABOLIC PANEL
ALT: 19 U/L (ref 0–44)
AST: 15 U/L (ref 15–41)
Albumin: 4 g/dL (ref 3.5–5.0)
Alkaline Phosphatase: 59 U/L (ref 38–126)
Anion gap: 7 (ref 5–15)
BUN: 9 mg/dL (ref 6–20)
CO2: 22 mmol/L (ref 22–32)
Calcium: 8.9 mg/dL (ref 8.9–10.3)
Chloride: 106 mmol/L (ref 98–111)
Creatinine, Ser: 0.57 mg/dL (ref 0.44–1.00)
GFR, Estimated: >60 mL/min (ref >60)
Glucose, Bld: 109 mg/dL — ABNORMAL HIGH (ref 70–99)
Potassium: 3.7 mmol/L (ref 3.5–5.1)
Sodium: 135 mmol/L (ref 135–145)
Total Bilirubin: 0.7 mg/dL (ref 0.3–1.2)
Total Protein: 7.7 g/dL (ref 6.5–8.1)

## 2021-02-06 LAB — URINALYSIS, COMPLETE (UACMP) WITH MICROSCOPIC
Bilirubin Urine: NEGATIVE
Glucose, UA: NEGATIVE mg/dL
Hgb urine dipstick: NEGATIVE
Ketones, ur: NEGATIVE mg/dL
Nitrite: NEGATIVE
Protein, ur: NEGATIVE mg/dL
Specific Gravity, Urine: 1.023 (ref 1.005–1.030)
pH: 5 (ref 5.0–8.0)

## 2021-02-06 LAB — CBC
HCT: 36.5 % (ref 36.0–46.0)
Hemoglobin: 11.7 g/dL — ABNORMAL LOW (ref 12.0–15.0)
MCH: 24.9 pg — ABNORMAL LOW (ref 26.0–34.0)
MCHC: 32.1 g/dL (ref 30.0–36.0)
MCV: 77.7 fL — ABNORMAL LOW (ref 80.0–100.0)
Platelets: 274 10*3/uL (ref 150–400)
RBC: 4.7 MIL/uL (ref 3.87–5.11)
RDW: 15.1 % (ref 11.5–15.5)
WBC: 9.9 10*3/uL (ref 4.0–10.5)
nRBC: 0.3 % — ABNORMAL HIGH (ref 0.0–0.2)

## 2021-02-06 LAB — POC URINE PREG, ED
Preg Test, Ur: NEGATIVE
Preg Test, Ur: NEGATIVE

## 2021-02-06 LAB — LIPASE, BLOOD: Lipase: 37 U/L (ref 11–51)

## 2021-02-06 MED ORDER — MORPHINE SULFATE (PF) 4 MG/ML IV SOLN
4.0000 mg | Freq: Once | INTRAVENOUS | Status: AC
  Administered 2021-02-06: 4 mg via INTRAVENOUS
  Filled 2021-02-06: qty 1

## 2021-02-06 MED ORDER — ONDANSETRON HCL 4 MG/2ML IJ SOLN
4.0000 mg | Freq: Once | INTRAMUSCULAR | Status: AC
  Administered 2021-02-06: 4 mg via INTRAVENOUS
  Filled 2021-02-06: qty 2

## 2021-02-06 MED ORDER — HYDROCODONE-ACETAMINOPHEN 5-325 MG PO TABS: 1.0000 | ORAL_TABLET | Freq: Four times a day (QID) | ORAL | 0 refills | Status: DC | PRN

## 2021-02-06 MED ORDER — IOHEXOL 300 MG/ML  SOLN
100.0000 mL | Freq: Once | INTRAMUSCULAR | Status: AC | PRN
  Administered 2021-02-06: 100 mL via INTRAVENOUS
  Filled 2021-02-06: qty 100

## 2021-02-06 NOTE — ED Provider Notes (Signed)
[facility] Emergency Department Provider Note  ____________________________________________   Event Date/Time   First MD Initiated Contact with Patient 02/06/21 1404     (approximate)  I have reviewed the triage vital signs and the nursing notes.   HISTORY  Chief Complaint Abdominal Pain, Nausea, and Shortness of Breath    HPI Brenda Fuentes is a 25 y.o. female with history of ms presents with abdominal pain for approx 1 month with worsening symptoms last 2 days.  No fever or chills, pt is getting ozempic infusions, has had a total of 3 so far, states pain is worse with lying on abdomen, pain worse in luq   Past Medical History:  Diagnosis Date  . Anxiety   . Depression 2016  . Gestational hypertension   . Hypercholesteremia 2009    Patient Active Problem List   Diagnosis Date Noted  . 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 Vitamin D deficiency 10/09/2019  . Generalized anxiety disorder 05/02/2019  . Major depressive disorder, recurrent episode, moderate (HCC) 05/02/2019  . Anxiety and depression 04/23/2019  . History of preterm delivery 02/18/2019  . History of gestational hypertension 02/18/2019    Past Surgical History:  Procedure Laterality Date  . NO PAST SURGERIES      Prior to Admission medications   Medication Sig Start Date End Date Taking? Authorizing Provider  HYDROcodone-acetaminophen (NORCO/VICODIN) 5-325 MG tablet Take 1 tablet by mouth every 6 (six) hours as needed for moderate pain. 02/06/21  Yes Fisher, Roselyn Bering, PA-C  citalopram (CELEXA) 20 MG tablet Take by mouth daily.  09/04/20 09/04/21  [provider]  ELDERBERRY PO Take 1 tablet by mouth 1 day or 1 dose. Patient not taking: Reported on 12/30/2020    [provider]  gabapentin (NEURONTIN) 300 MG capsule Take by mouth 2 (two) times daily.  09/04/20 09/04/21  [provider]   Multiple Vitamins-Minerals (WOMENS MULTIVITAMIN PO) Take 2 tablets by mouth. 2 gummies per day    [provider]  natalizumab (TYSABRI) 300 MG/15ML injection Inject into the vein. Infusion every 4 weeks, unsure of start date    [provider]  omeprazole (PRILOSEC) 20 MG capsule Take by mouth 2 (two) times daily before a meal.  07/23/20   [provider]  PARAGARD INTRAUTERINE COPPER IU by Intrauterine route.    [provider]  Prenatal Vit-Fe Fumarate-FA (PRENATAL MULTIVITAMIN) TABS tablet Take 1 tablet by mouth daily at 12 noon.    [provider]  propranolol (INDERAL) 20 MG tablet Take by mouth 2 (two) times daily.  09/04/20 09/04/21  [provider]  rizatriptan (MAXALT) 10 MG tablet Take by mouth as needed.  10/11/20   [provider]  Vitamin D, Ergocalciferol, (DRISDOL) 1.25 MG (50000 UT) CAPS capsule Take 1 capsule (50,000 Units total) by mouth every 7 (seven) days. 10/09/19   Shambley, Melody N, CNM    Allergies Penicillins  Family History  Problem Relation Age of Onset  . Hyperlipidemia Mother   . Heart failure Father   . Diabetes Father     Social History Social History   Tobacco Use  . Smoking status: Former Smoker    Packs/day: 0.50    Quit date: 08/2018    Years since quitting: 2.4  . Smokeless tobacco: Never Used  Vaping Use  . Vaping Use: Former  Substance Use Topics  . Alcohol use: No    Comment: last ETOH  09/05/20  . Drug use: No    Review of Systems  Constitutional: No fever/chills Eyes: No visual changes. ENT: No sore throat. Respiratory: Denies cough Cardiovascular: Denies chest pain Gastrointestinal: Positive abdominal pain Genitourinary: Negative for dysuria. Musculoskeletal: Negative for back pain. Skin: Negative for rash. Psychiatric: no mood changes,     ____________________________________________   PHYSICAL EXAM:  VITAL SIGNS: ED Triage Vitals  Enc Vitals Group      BP 02/06/21 1138 (!) 159/91     Pulse Rate 02/06/21 1138 (!) 102     Resp 02/06/21 1138 18     Temp 02/06/21 1138 98.8 F (37.1 C)     Temp Source 02/06/21 1138 Oral     SpO2 02/06/21 1138 99 %     Weight 02/06/21 1136 219 lb (99.3 kg)     Height 02/06/21 1136 5\' 6"  (1.676 m)     Head Circumference --      Peak Flow --      Pain Score 02/06/21 1136 8     Pain Loc --      Pain Edu? --      Excl. in GC? --     Constitutional: Alert and oriented. Well appearing and in no acute distress. Eyes: Conjunctivae are normal.  Head: Atraumatic. Nose: No congestion/rhinnorhea. Mouth/Throat: Mucous membranes are moist.   Neck:  supple no lymphadenopathy noted Cardiovascular: Normal rate, regular rhythm. Heart sounds are normal Respiratory: Normal respiratory effort.  No retractions, lungs c t a  Abd: soft tender in the left upper quadrant, right lower quadrant, positive CVA tenderness on the left side bs normal all 4 quad GU: deferred Musculoskeletal: FROM all extremities, warm and well perfused Neurologic:  Normal speech and language.  Skin:  Skin is warm, dry and intact. No rash noted. Psychiatric: Mood and affect are normal. Speech and behavior are normal.  ____________________________________________   LABS (all labs ordered are listed, but only abnormal results are displayed)  Labs Reviewed  COMPREHENSIVE METABOLIC PANEL - Abnormal; Notable for the following components:      Result Value   Glucose, Bld 109 (*)    All other components within normal limits  CBC - Abnormal; Notable for the following components:   Hemoglobin 11.7 (*)    MCV 77.7 (*)    MCH 24.9 (*)    nRBC 0.3 (*)    All other components within normal limits  URINALYSIS, COMPLETE (UACMP) WITH MICROSCOPIC - Abnormal; Notable for the following components:   Color, Urine YELLOW (*)    APPearance HAZY (*)    Leukocytes,Ua TRACE (*)    Bacteria, UA RARE (*)    All other components within normal limits  LIPASE,  BLOOD  POC URINE PREG, ED  POC URINE PREG, ED   ____________________________________________   ____________________________________________  RADIOLOGY  CT abdomen/pelvis with IV contrast  ____________________________________________   PROCEDURES  Procedure(s) performed: No  Procedures    ____________________________________________   INITIAL IMPRESSION / ASSESSMENT AND PLAN / ED COURSE  Pertinent labs & imaging results that were available during my care of the patient were reviewed by me and considered in my medical decision making (see chart for details).   The patient is a 25 year old female presents with abdominal pain.  History of MS with Ozempic infusions.  See HPI.  Physical exam shows patient appears stable.  Abdomen is tender, left CVA tenderness  DDx: Acute pancreatitis, acute kidney stone, acute pyelonephritis, splenic injury, AKI  CBC has hemoglobin 11.7, comprehensive metabolic panel  is normal, lipase is normal, urinalysis has trace of leuks, POC pregnancy is negative  I do not feel the patient has acute pancreatitis as her lipase is normal, she does not have a kidney injury as labs are normal, no pyelonephritis as her urinalysis appears to be normal   CT abdomen/pelvis is negative for any acute abnormality  Explained the pain to the patient.  Explained her this could be a side effect of her medication pain could actually be coming from her back.  She is to follow-up with her regular doctor.  Return emergency department worsening.  She is discharged stable condition with prescription for pain medication.  Brenda Fuentes was evaluated in Emergency Department on 02/06/2021 for the symptoms described in the history of present illness. She was evaluated in the context of the global COVID-19 pandemic, which necessitated consideration that the patient might be at risk for infection with the SARS-CoV-2 virus that causes COVID-19. Institutional protocols and  algorithms that pertain to the evaluation of patients at risk for COVID-19 are in a state of rapid change based on information released by regulatory bodies including the CDC and federal and state organizations. These policies and algorithms were followed during the patient's care in the ED.    As part of my medical decision making, I reviewed the following data within the electronic MEDICAL RECORD NUMBER Nursing notes reviewed and incorporated, Labs reviewed , Old chart reviewed, Radiograph reviewed , Notes from prior ED visits and [place] Controlled Substance Database  ____________________________________________   FINAL CLINICAL IMPRESSION(S) / ED DIAGNOSES  Final diagnoses:  Acute abdominal pain      NEW MEDICATIONS STARTED DURING THIS VISIT:  Discharge Medication List as of 02/06/2021  4:28 PM    START taking these medications   Details  HYDROcodone-acetaminophen (NORCO/VICODIN) 5-325 MG tablet Take 1 tablet by mouth every 6 (six) hours as needed for moderate pain., Starting Sat 02/06/2021, Normal         Note:  This document was prepared using Dragon voice recognition software and may include unintentional dictation errors.    Faythe Ghee, PA-C 02/06/21 1923    Concha Se, MD 02/08/21 [phone]

## 2021-02-06 NOTE — ED Notes (Signed)
Pt with LUQ abdominal pain for a month. Pt states it has gotten worse so she came to the ED. Pt states pain starts in LUQ and radiates to back. Pt able to walk to bathroom for urine sample, gait steady.

## 2021-02-06 NOTE — ED Triage Notes (Signed)
Pt comes pov with left sided abdominal pain, nausea, loss of appetite, and shortness of breath for about a month. Pt has MS. Pt states pain has gotten worse. PCP wanted to do a CT scan.

## 2021-02-08 ENCOUNTER — Ambulatory Visit: Payer: Medicaid Other

## 2021-02-10 ENCOUNTER — Ambulatory Visit: Payer: Medicaid Other

## 2021-02-11 ENCOUNTER — Telehealth: Payer: Self-pay

## 2021-02-11 NOTE — Telephone Encounter (Signed)
Attempted TC to patient. LMTC TB RN. Needs TBM appt Richmond Campbell, RN

## 2021-02-15 ENCOUNTER — Ambulatory Visit: Payer: Medicaid Other

## 2021-02-16 ENCOUNTER — Ambulatory Visit: Payer: Medicaid Other | Admitting: Licensed Clinical Social Worker

## 2021-02-16 DIAGNOSIS — F331 Major depressive disorder, recurrent, moderate: Secondary | ICD-10-CM

## 2021-02-16 DIAGNOSIS — F411 Generalized anxiety disorder: Secondary | ICD-10-CM

## 2021-02-16 NOTE — Progress Notes (Signed)
Counselor/Therapist Progress Note  Patient ID: LAREEN MULLINGS, MRN: [id number],    Date: 02/16/2021  Time Spent: 51 minutes   Treatment Type: Psychotherapy  Reported Symptoms: Obsessive thinking, Anhedonia, Sleep disturbance, Fatigue, Physical aches and pain and depressed mood   Mental Status Exam:  Appearance:   NA     Behavior:  Appropriate and Sharing  Motor:  NA  Speech/Language:   Clear and Coherent and Normal Rate  Affect:  NA  Mood:  depressed  Thought process:  normal  Thought content:    WNL  Sensory/Perceptual disturbances:    WNL  Orientation:  oriented to person, place, time/date and situation  Attention:  Good  Concentration:  Good  Memory:  WNL  Fund of knowledge:   Good  Insight:    Fair  Judgment:   Good  Impulse Control:  Good   Risk Assessment: Danger to Self:  No Self-injurious Behavior: No Danger to Others: No Duty to Warn:no Physical Aggression / Violence:No  Access to Firearms a concern: No  Gang Involvement:No   Subjective: Patient was engaged and cooperative throughout the session using time effectively to discuss thoughts and feelings. Patient voices continued motivation for treatment and understanding of depression and anxiety issues. Patient is likely to benefit from future treatment because she remains motivated to decrease symptoms and reports benefit of regular sessions in addressing symptoms.   Interventions: Cognitive Behavioral Therapy Established psychological safety. Checked in with patient regarding her week. Provided supportive space encouraging emotional release and processing of current psychosocial stressors, continued anxiety and depression. Explored patient's perception of current challenges within the relationship, highlighting unhelpful thoughts and reframing these thoughts leading to increased frustration. Taught patient about toxic positivity. Assisted patient in identifying coping strategies, including playing with the kids  outside and gardening. Provided support through active listening, validation of feelings, and highlighted patient's strengths.   Diagnosis:   ICD-10-CM   1. Generalized anxiety disorder  F41.1   2. Major depressive disorder, recurrent episode, moderate (HCC)  F33.1      Plan: Patient's goal: to be in a better head space, figure out who she is as a person, to be more confident, to do more for herself, find herself.  Increase sense of self -"who am I"  Values clarification   Assist patient in developing goal and value directions   Explore and teach problem solving skills  Assertiveness communication  Monitor progress and discuss barriers   Increase mood regulationand decrease anxiety  Questioning and challenging thoughts  Cognitive restructuring   Mindfulness strategies  Self-care strategies   Help patient to develop reality-based, positive cognitive messages  Calming techniques PMR and deep breathing  Future Appointments  Date Time Provider Department Center  02/17/2021  4:00 PM Baird Kay, PT [facility] None  02/23/2021  1:45 PM Baird Kay, PT [facility] None  02/25/2021  1:45 PM Baird Kay, PT [facility] None    Kathreen Cosier, LCSW

## 2021-02-17 ENCOUNTER — Ambulatory Visit: Payer: Medicaid Other | Attending: Neurology

## 2021-02-17 ENCOUNTER — Other Ambulatory Visit: Payer: Self-pay

## 2021-02-17 DIAGNOSIS — R2681 Unsteadiness on feet: Secondary | ICD-10-CM | POA: Diagnosis present

## 2021-02-17 DIAGNOSIS — M6281 Muscle weakness (generalized): Secondary | ICD-10-CM | POA: Diagnosis present

## 2021-02-17 DIAGNOSIS — R293 Abnormal posture: Secondary | ICD-10-CM | POA: Diagnosis present

## 2021-02-17 DIAGNOSIS — R2689 Other abnormalities of gait and mobility: Secondary | ICD-10-CM | POA: Insufficient documentation

## 2021-02-17 NOTE — Therapy (Signed)
[place] MAIN [facility] SERVICES [address] [place], [place], [address] Phone: [phone]   Fax:  [phone]  Physical Therapy Treatment  Patient Details  Name: Brenda Fuentes MRN: [id number] Date of Birth: [birth year] Referring Provider (PT): Darlin Priestly   Encounter Date: 02/17/2021   PT End of Session - 02/17/21 1653    Visit Number 7    Number of Visits 25    Date for PT Re-Evaluation 03/17/21    Authorization Type eval: 01/12    PT Start Time 1602    PT Stop Time 1644    PT Time Calculation (min) 42 min    Equipment Utilized During Treatment Gait belt    Activity Tolerance Patient tolerated treatment well    Behavior During Therapy [facility] for tasks assessed/performed           Past Medical History:  Diagnosis Date  . Anxiety   . Depression 2016  . Gestational hypertension   . Hypercholesteremia 2009    Past Surgical History:  Procedure Laterality Date  . NO PAST SURGERIES      There were no vitals filed for this visit.   Subjective Assessment - 02/17/21 1605    Subjective Pt reports she is not having infusion tomorrow due to recent abdominal pain. Pt reports pain has improved. She did go to ED recently for pain and she reports her doctors think it is due to her medication. Pt reports no pain currently but that she's extremely tired.    Pertinent History 25 year old female with history of migraines, depression, anxiety, vitamin D deficiency and recent diagnosis of multiple sclerosis relapsing remitting presented to the ED on 09/14/20 for four weeks of left sided and trunk paresthesias and weakness as well a Lhermitte's phenomenon, gait imbalance, blurry vision and pain with eye movements. Outpatient MRI brain and cervical spine was obtained which was concerning for MS with some enhancing lesions. Initial plan was for home health IV steroids, but she presented to the ED after they were not initiated and she had lack of response to  oral steroids. Screening labs revealed a positive quantiferon gold prompting initiation of isoniazid and rifapentine. She was treated with IV steroids with some improvement in symptoms. At her first clinic visit we planned to initiate Tysabri due to active MS with spinal cord involvement. Tysabri was started on 12/16 and she unfortunately experienced new symptoms of left eye pain and blurry vision two weeks ago. Therefore there is concern for a clinical relapse prior to therapy. She was referred to physical therapy for gait. MRI C/T spine w/ and w/out contract on 09/15/20 was positive for the following: Hyperintense lesion within the spinal cord at C2-C3 with enhancement concerning for active demyelination.    Limitations Sitting;Reading;Lifting;Standing;Walking    How long can you sit comfortably? 1 hour    How long can you stand comfortably? 10-30 mins    How long can you walk comfortably? short distances with Yingling/cane for 1 hour    Patient Stated Goals "I want to get stronger so I can play with my kids"    Currently in Pain? No/denies    Pain Onset Today    Pain Onset 1 to 4 weeks ago           TREATMENT   Neuromuscular Re-ed  Korebalance: CGA provided throughout Tux racer level 1 x 1 met all targets with UUE support Tux racer level 2 x 3 attempts, dim conditions, 2 fingers support;  difficulty meeting targets, greater difficulty modulating to smaller and quicker weigh-shifts in all directions.  Standing on airex pad: CGA provided for all EO x30 sec EO feet together x30 sec EC 2x30 sec; increased postural sway EC feet together 3x30 sec LOB anteriorly, needs UE support to regain balance and up to min assist from PT Tandem stance 2x30 sec with each LE as stance leg. Pt with greatest difficulty with LLE as stance leg. She requires intermittent UE support throughout. SLB 2x30 sec with each LE as stance leg  Walking cone taps and hedgehog taps - 2x Airex beam tandem walking 2x6,  intermittent UE support, high guard.       PT Short Term Goals - 12/23/20 1826      PT SHORT TERM GOAL #1   Title Patient will be independent in home exercise program to improve strength/mobility for better functional independence with ADLs.    Time 6    Period Weeks    Status New    Target Date 02/03/21             PT Long Term Goals - 12/23/20 1827      PT LONG TERM GOAL #1   Title Patient will increase FOTO score to equal to or greater than 57 to demonstrate statistically significant improvement in mobility and quality of life.    Baseline 01/12: 48    Time 12    Period Weeks    Status New    Target Date 03/17/21      PT LONG TERM GOAL #2   Title Patient (< 37 years old) will complete five times sit to stand test in < 10 seconds indicating an increased LE strength and improved balance.    Baseline 01/12: 13.98s    Time 12    Period Weeks    Status New    Target Date 03/17/21      PT LONG TERM GOAL #3   Title Patient will increase Berg Balance score by > 6 points to demonstrate decreased fall risk during functional activities.    Baseline 01/12: 48/56    Time 12    Period Weeks    Status New    Target Date 03/17/21      PT LONG TERM GOAL #4   Title Patient will increase six minute walk test distance to >1000 for progression to community ambulator and improve gait ability    Baseline 01/12: 915 feet without AD    Time 12    Period Weeks    Status New    Target Date 03/17/21                 Plan - 02/17/21 1653    Clinical Impression Statement Pt able to maintain balance on compliant surface with eyes open/visual cues, but requires intermittent UE support and up to min assist on compliant surface with EC exercises. Pt also with decreased dynamic balance on compliant surface, indicating greater reliance on somatosensory cues as well as visual cues. Pt will benefit from further skilled therapy to improve dynamic and static balance in order to improve  safety with all activities.    Personal Factors and Comorbidities Comorbidity 3+;Time since onset of injury/illness/exacerbation;Fitness    Comorbidities migraines, depression, anxiety, vitamin D deficiency, hypercholestermia, and recent diagnosis of multiple sclerosis relapsing remitting    Examination-Activity Limitations Stairs;Stand;Sit;Bend;Lift    Examination-Participation Restrictions School;Community Activity    Stability/Clinical Decision Making Evolving/Moderate complexity    Rehab Potential Fair  PT Frequency 2x / week    PT Duration 12 weeks    PT Treatment/Interventions ADLs/Self Care Home Management;Aquatic Therapy;Biofeedback;Gait training;Stair training;Functional mobility training;Therapeutic activities;Therapeutic exercise;Balance training;Neuromuscular re-education;Patient/family education;Manual techniques;Passive range of motion;Joint Manipulations;Energy conservation    PT Next Visit Plan strength training, dynamic balance    Consulted and Agree with Plan of Care Patient           Patient will benefit from skilled therapeutic intervention in order to improve the following deficits and impairments:  Abnormal gait,Decreased activity tolerance,Decreased endurance,Decreased strength,Impaired sensation,Decreased balance,Decreased coordination,Decreased mobility,Decreased safety awareness,Difficulty walking,Increased muscle spasms  Visit Diagnosis: Unsteadiness on feet  Muscle weakness (generalized)  Abnormal posture     Problem List Patient Active Problem List   Diagnosis Date Noted  . 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 Vitamin D deficiency 10/09/2019  . Generalized anxiety disorder 05/02/2019  . Major depressive disorder, recurrent episode, moderate ([place]) 05/02/2019  . Anxiety and depression 04/23/2019  . History of preterm delivery 02/18/2019  . History of gestational hypertension  02/18/2019   Ricard Dillon PT, DPT  Zollie Pee 02/17/2021, 4:58 PM  [place] MAIN [facility] SERVICES [address] [place], [place], [address] Phone: [phone]   Fax:  [phone]  Name: OVA GILLENTINE MRN: [id number] Date of Birth: 03-16-1996

## 2021-02-19 ENCOUNTER — Ambulatory Visit: Payer: Medicaid Other

## 2021-02-22 ENCOUNTER — Ambulatory Visit: Payer: Medicaid Other | Admitting: Licensed Clinical Social Worker

## 2021-02-23 ENCOUNTER — Ambulatory Visit: Payer: Medicaid Other

## 2021-02-23 ENCOUNTER — Other Ambulatory Visit: Payer: Self-pay

## 2021-02-23 DIAGNOSIS — R2681 Unsteadiness on feet: Secondary | ICD-10-CM | POA: Diagnosis not present

## 2021-02-23 DIAGNOSIS — M6281 Muscle weakness (generalized): Secondary | ICD-10-CM

## 2021-02-23 DIAGNOSIS — R293 Abnormal posture: Secondary | ICD-10-CM

## 2021-02-23 NOTE — Therapy (Signed)
[place] [facility] MAIN [facility] SERVICES [address] [place], [place], [address] Phone: [phone]   Fax:  [phone]  Physical Therapy Treatment  Patient Details  Name: Brenda Fuentes MRN: [id number] Date of Birth: [birth year] Referring Provider (PT): Marian Sorrow   Encounter Date: 02/23/2021   PT End of Session - 02/23/21 1438    Visit Number 8    Number of Visits 25    Date for PT Re-Evaluation 03/17/21    Authorization Type eval: 01/12    PT Start Time 1348    PT Stop Time 1430    PT Time Calculation (min) 42 min    Equipment Utilized During Treatment Gait belt    Activity Tolerance Patient tolerated treatment well;Patient limited by fatigue    Behavior During Therapy [facility] for tasks assessed/performed           Past Medical History:  Diagnosis Date  . Anxiety   . Depression 2016  . Gestational hypertension   . Hypercholesteremia 2009    Past Surgical History:  Procedure Laterality Date  . NO PAST SURGERIES      There were no vitals filed for this visit.   Subjective Assessment - 02/23/21 1436    Subjective Pt has 3-4/10 neck pain (not new pain). Pt reports she has neurologist appointment next week but reports she has not had abdominal pain since stopping infusions.    Pertinent History 25 year old female with history of migraines, depression, anxiety, vitamin D deficiency and recent diagnosis of multiple sclerosis relapsing remitting presented to the ED on 09/14/20 for four weeks of left sided and trunk paresthesias and weakness as well a Lhermitte's phenomenon, gait imbalance, blurry vision and pain with eye movements. Outpatient MRI brain and cervical spine was obtained which was concerning for MS with some enhancing lesions. Initial plan was for home health IV steroids, but she presented to the ED after they were not initiated and she had lack of response to oral steroids. Screening labs revealed a positive quantiferon gold prompting  initiation of isoniazid and rifapentine. She was treated with IV steroids with some improvement in symptoms. At her first clinic visit we planned to initiate Tysabri due to active MS with spinal cord involvement. Tysabri was started on 12/16 and she unfortunately experienced new symptoms of left eye pain and blurry vision two weeks ago. Therefore there is concern for a clinical relapse prior to therapy. She was referred to physical therapy for gait. MRI C/T spine w/ and w/out contract on 09/15/20 was positive for the following: Hyperintense lesion within the spinal cord at C2-C3 with enhancement concerning for active demyelination.    Limitations Sitting;Reading;Lifting;Standing;Walking    How long can you sit comfortably? 1 hour    How long can you stand comfortably? 10-30 mins    How long can you walk comfortably? short distances with Laker/cane for 1 hour    Patient Stated Goals "I want to get stronger so I can play with my kids"    Currently in Pain? Yes    Pain Score 4     Pain Location Neck    Pain Onset Today    Pain Onset 1 to 4 weeks ago          TREATMENT  Korebalance: CGA provided throughout  Tux racer level 1 x 3, performed without UE support   Tux racer level 2 x 3 attempts, dim conditions, 2 fingers support, greater difficulty modulating to smaller and quicker weigh-shifts in  all directions without UE support Pt reports wooziness with exercise which resolves after a few minutes of discontinuing exercise  Seated upper trap stretch - 2x30 sec B  AROM cervical ext/flexion 10x   STS x10   Staggered STS 1x8 BLEs; Pt reported increased LE numbness with LLE as stance leg  STS from airex pad x10 with CGA   Standing hip abduction with 4# AW - 2x8   Standing on airex pad: CGA provided for all  EC feet together 2x30 sec; no UE support  Tandem stance 2x30 sec with each LE as stance leg. No UE support this session (improvement from intermittent UE support used  previously)  Semi-tandem walking 4x  Tandem walking 2x  Airex beam cross-over stepping x10, intermittent UE support, high guard.   Education provided throughout session with VC/TC to facilitate proper technique at target joints and with correct muscular activation.   PT Education - 02/23/21 1437    Education Details Standing hip abduction exercise technique, body mechanics    Person(s) Educated Patient    Methods Explanation;Verbal cues;Demonstration    Comprehension Verbalized understanding;Returned demonstration            PT Short Term Goals - 12/23/20 1826      PT SHORT TERM GOAL #1   Title Patient will be independent in home exercise program to improve strength/mobility for better functional independence with ADLs.    Time 6    Period Weeks    Status New    Target Date 02/03/21             PT Long Term Goals - 12/23/20 1827      PT LONG TERM GOAL #1   Title Patient will increase FOTO score to equal to or greater than 57 to demonstrate statistically significant improvement in mobility and quality of life.    Baseline 01/12: 48    Time 12    Period Weeks    Status New    Target Date 03/17/21      PT LONG TERM GOAL #2   Title Patient (< 47 years old) will complete five times sit to stand test in < 10 seconds indicating an increased LE strength and improved balance.    Baseline 01/12: 13.98s    Time 12    Period Weeks    Status New    Target Date 03/17/21      PT LONG TERM GOAL #3   Title Patient will increase Berg Balance score by > 6 points to demonstrate decreased fall risk during functional activities.    Baseline 01/12: 48/56    Time 12    Period Weeks    Status New    Target Date 03/17/21      PT LONG TERM GOAL #4   Title Patient will increase six minute walk test distance to >1000 for progression to community ambulator and improve gait ability    Baseline 01/12: 915 feet without AD    Time 12    Period Weeks    Status New    Target Date  03/17/21                 Plan - 02/23/21 1439    Clinical Impression Statement Pt resistance training this session slightly limited d/t fatigue secondary to pt reporting she has temporarly stopped receiving MS infusion medication. However, she exhibits improvements with static balance tasks, but still has difficulty with refining weight-shifts without UE support. Pt will benefit from further skilled therapy  to improve BLE strength and balance in order to improve QOL.    Personal Factors and Comorbidities Comorbidity 3+;Time since onset of injury/illness/exacerbation;Fitness    Comorbidities migraines, depression, anxiety, vitamin D deficiency, hypercholestermia, and recent diagnosis of multiple sclerosis relapsing remitting    Examination-Activity Limitations Stairs;Stand;Sit;Bend;Lift    Examination-Participation Restrictions School;Community Activity    Stability/Clinical Decision Making Evolving/Moderate complexity    Rehab Potential Fair    PT Frequency 2x / week    PT Duration 12 weeks    PT Treatment/Interventions ADLs/Self Care Home Management;Aquatic Therapy;Biofeedback;Gait training;Stair training;Functional mobility training;Therapeutic activities;Therapeutic exercise;Balance training;Neuromuscular re-education;Patient/family education;Manual techniques;Passive range of motion;Joint Manipulations;Energy conservation    PT Next Visit Plan strength training, dynamic balance, advance strength training of hip musculature    Consulted and Agree with Plan of Care Patient           Patient will benefit from skilled therapeutic intervention in order to improve the following deficits and impairments:  Abnormal gait,Decreased activity tolerance,Decreased endurance,Decreased strength,Impaired sensation,Decreased balance,Decreased coordination,Decreased mobility,Decreased safety awareness,Difficulty walking,Increased muscle spasms  Visit Diagnosis: Muscle weakness  (generalized)  Unsteadiness on feet  Abnormal posture     Problem List Patient Active Problem List   Diagnosis Date Noted  . 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 Vitamin D deficiency 10/09/2019  . Generalized anxiety disorder 05/02/2019  . Major depressive disorder, recurrent episode, moderate (HCC) 05/02/2019  . Anxiety and depression 04/23/2019  . History of preterm delivery 02/18/2019  . History of gestational hypertension 02/18/2019   Temple Pacini PT, DPT 02/23/2021, 2:55 PM  [place] [facility] MAIN [facility] SERVICES [address] [place], [place], [address] Phone: [phone]   Fax:  [phone]  Name: Brenda Fuentes MRN: [id number] Date of Birth: [birth year]

## 2021-02-25 ENCOUNTER — Ambulatory Visit: Payer: Medicaid Other

## 2021-03-01 ENCOUNTER — Ambulatory Visit: Payer: Medicaid Other

## 2021-03-02 ENCOUNTER — Ambulatory Visit: Payer: Medicaid Other | Admitting: Licensed Clinical Social Worker

## 2021-03-02 DIAGNOSIS — F331 Major depressive disorder, recurrent, moderate: Secondary | ICD-10-CM

## 2021-03-02 DIAGNOSIS — F411 Generalized anxiety disorder: Secondary | ICD-10-CM

## 2021-03-02 NOTE — Progress Notes (Signed)
Counselor/Therapist Progress Note  Patient ID: Brenda Fuentes, MRN: [id number],    Date: 03/02/2021  Time Spent: 44 minutes    Treatment Type: Psychotherapy  Reported Symptoms: overall mood stability  Mental Status Exam:  Appearance:   NA     Behavior:  Appropriate and Sharing  Motor:  NA  Speech/Language:   Clear and Coherent and Normal Rate  Affect:  NA  Mood:  euthymic  Thought process:  normal  Thought content:    WNL  Sensory/Perceptual disturbances:    WNL  Orientation:  oriented to person, place, time/date and situation  Attention:  Good  Concentration:  Good  Memory:  WNL  Fund of knowledge:   Good  Insight:    Fair  Judgment:   Fair  Impulse Control:  Fair   Risk Assessment: Danger to Self:  No Self-injurious Behavior: No Danger to Others: No Duty to Warn:no Physical Aggression / Violence:No  Access to Firearms a concern: No  Gang Involvement:No   Subjective: Patient was engaged and cooperative throughout the session using time effectively to discuss thoughts and feelings. Patient voices continued motivation for treatment and understanding of anxiety and depression issues. Patient is likely to benefit from future treatment because she remains motivated to manage symptoms and reports benefit of regular sessions.   Interventions: Cognitive Behavioral Therapy Established psychological safety. Checked in with patient regarding her week. Engaged patient in processing current psychosocial stressors, static symptoms. Engaged patient in exploring her values related to parenting, career, and social engagement. Discussed options for patient to increase social engagement activities and problem solved barriers. Provided support through active listening, validation of feelings, and highlighted patient's strengths.   Diagnosis:   ICD-10-CM   1. Generalized anxiety disorder  F41.1   2. Major depressive disorder, recurrent episode, moderate (HCC)  F33.1    Plan: Patient's  goal: to be in a better head space, figure out who she is as a person, to be more confident, to do more for herself, find herself.  Increase sense of self -"who am I"  Values clarification   Assist patient in developing goal and value directions   Explore and teach problem solving skills  Assertiveness communication  Monitor progress and discuss barriers   Increase mood regulationand decrease anxiety  Questioning and challenging thoughts  Cognitive restructuring   Mindfulness strategies  Self-care strategies  Help patient to develop reality-based, positive cognitive messages  Calming techniques PMR and deep breathing  Future Appointments  Date Time Provider Department Center  03/09/2021  3:00 PM Kathreen Cosier, LCSW [facility] None  03/10/2021  2:30 PM Temple Pacini R, PT [facility] None  03/17/2021  2:30 PM Temple Pacini R, PT [facility] None  03/23/2021  1:45 PM Temple Pacini R, PT [facility] None  03/25/2021  1:45 PM Temple Pacini R, PT [facility] None  03/30/2021  1:45 PM Baird Kay, PT [facility] None  04/05/2021  2:30 PM Baird Kay, PT [facility] None  04/07/2021  2:30 PM Baird Kay, PT [facility] None    Kathreen Cosier, LCSW

## 2021-03-03 ENCOUNTER — Ambulatory Visit: Payer: Medicaid Other

## 2021-03-05 IMAGING — MR MR THORACIC SPINE WO/W CM
6 of 9 series · 28 of 48 positions shown · IV contrast (gadavist)
Comparison: 09/09/2020.

CLINICAL DATA: Multiple sclerosis.

EXAM:
MRI CERVICAL AND THORACIC SPINE WITHOUT AND WITH CONTRAST
TECHNIQUE: Multiplanar and multiecho pulse sequences of the cervical spine, to
include the craniocervical junction and cervicothoracic junction,
and the thoracic spine, were obtained without and with intravenous
contrast.
CONTRAST:  10mL GADAVIST GADOBUTROL 1 MMOL/ML IV SOLN

[Series 26: T1 · sagittal · 5.0mm · 1.41mm/px · 1 of 9 slices shown (1 of 3)]
[im 1/9]
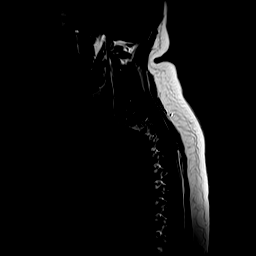

[Series 27: T2 · sagittal · 3.0mm · 1.06mm/px · 3 of 17 slices shown (1 of 2)]
[im 1/17]
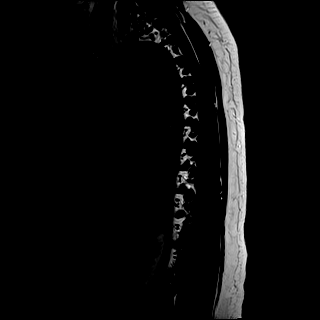
[im 9/17]
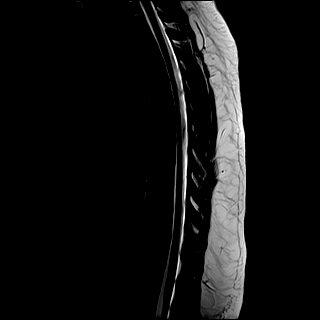
[im 17/17]
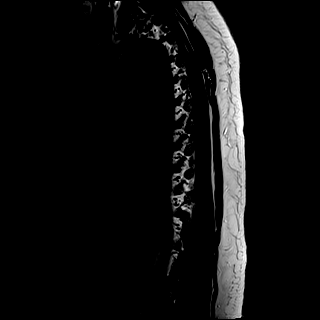

[Series 28: T1 · sagittal · 3.0mm · 1.06mm/px · 4 of 17 slices shown (2 of 3)]
[im 1/17]
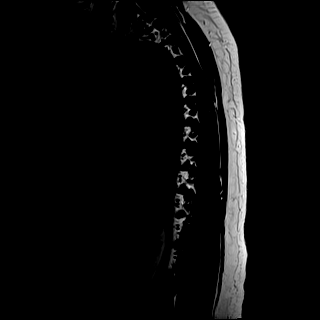
[im 6/17]
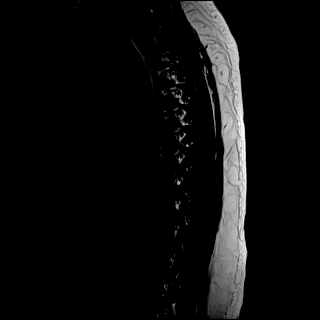
[im 11/17]
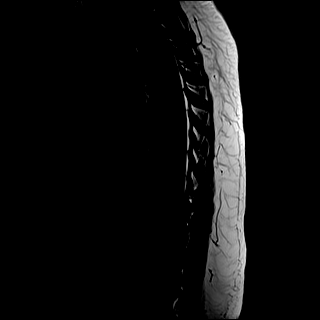
[im 17/17]
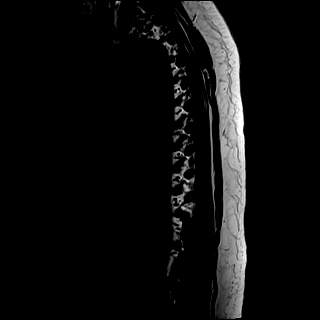

[Series 30: T2 · axial · 4.0mm · 0.59mm/px · z∈[-352,-108]mm · 8 of 39 slices shown (2 of 2)]
[im 1/39]
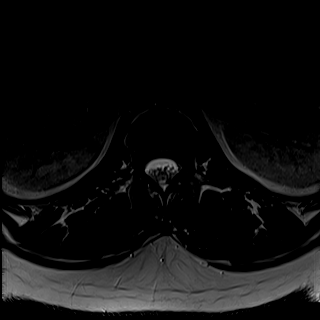
[im 6/39]
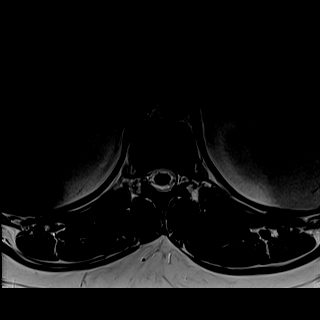
[im 11/39]
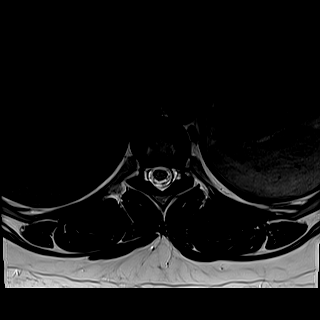
[im 17/39]
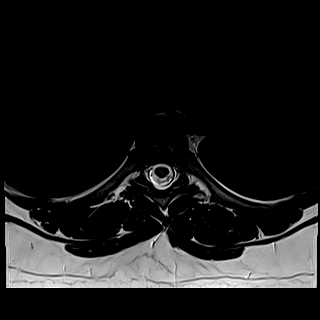
[im 22/39]
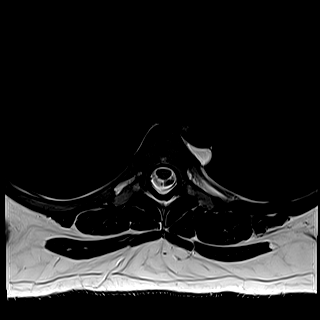
[im 28/39]
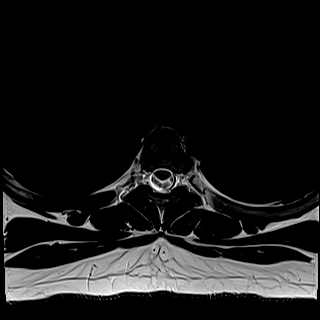
[im 33/39]
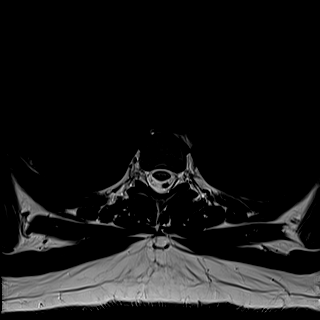
[im 39/39]
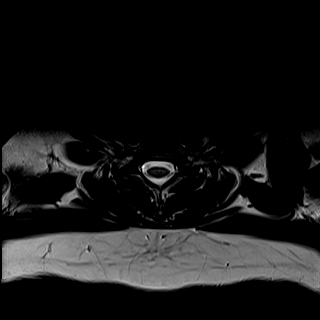

[Series 32: T1 · axial · non-contrast · 4.0mm · 0.31mm/px · z∈[-352,-108]mm · 8 of 39 slices shown (3 of 3)]
[im 1/39]
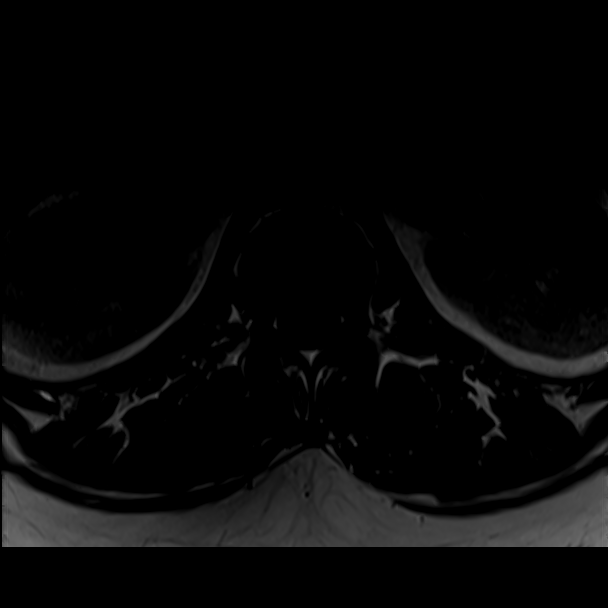
[im 6/39]
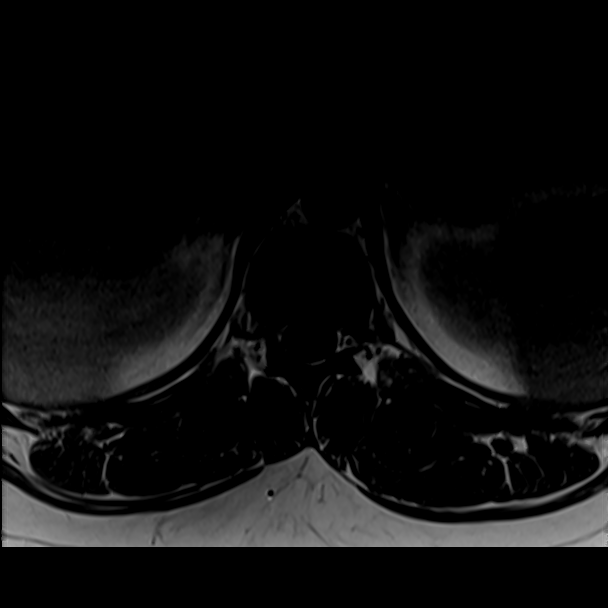
[im 11/39]
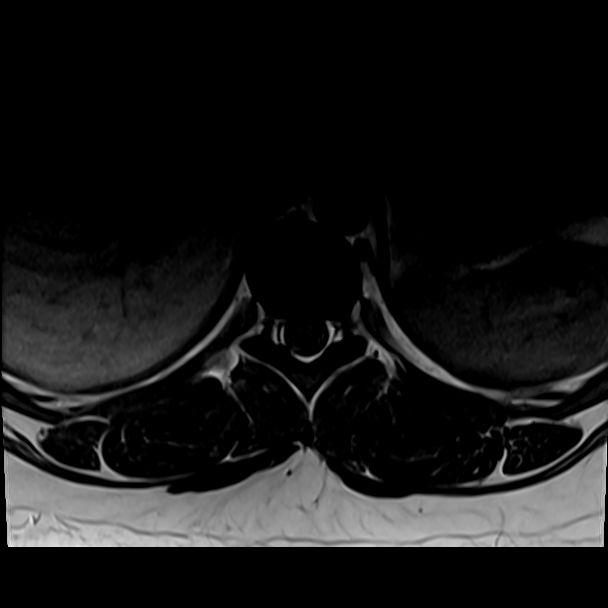
[im 17/39]
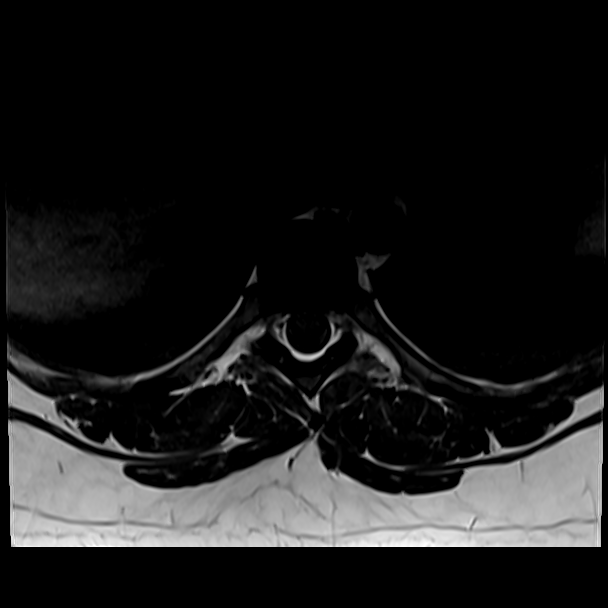
[im 22/39]
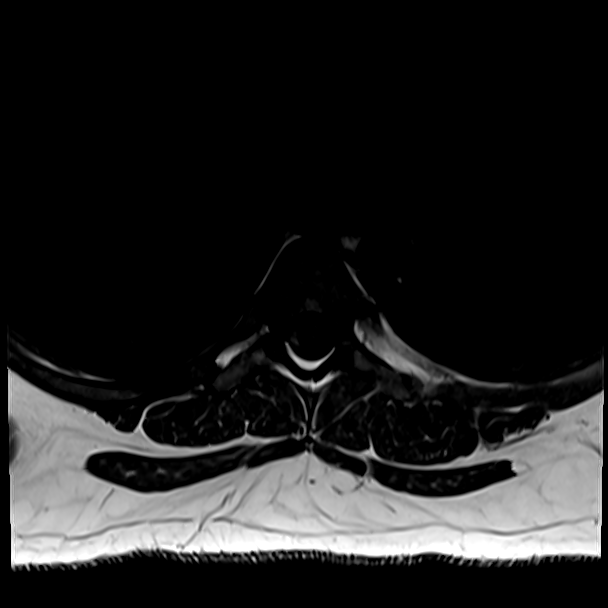
[im 28/39]
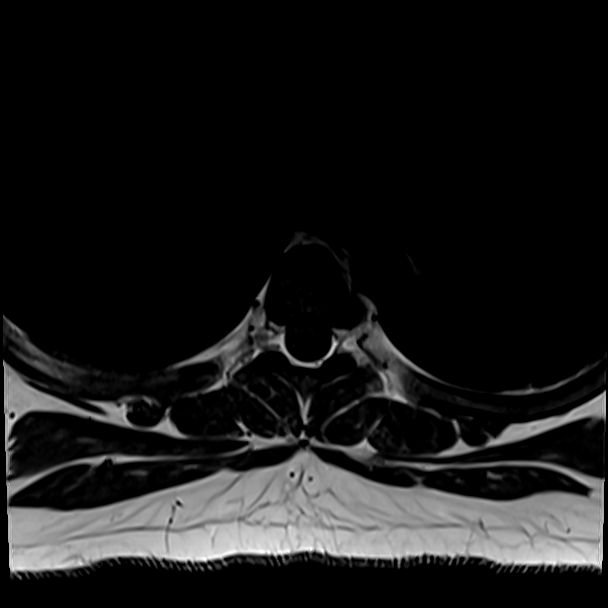
[im 33/39]
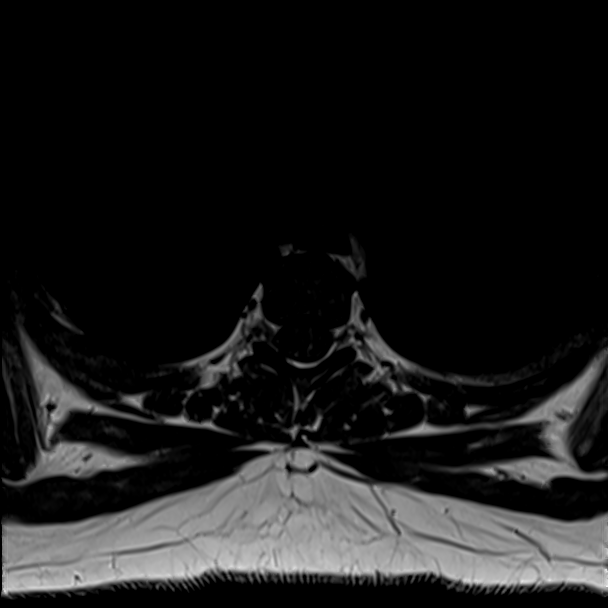
[im 39/39]
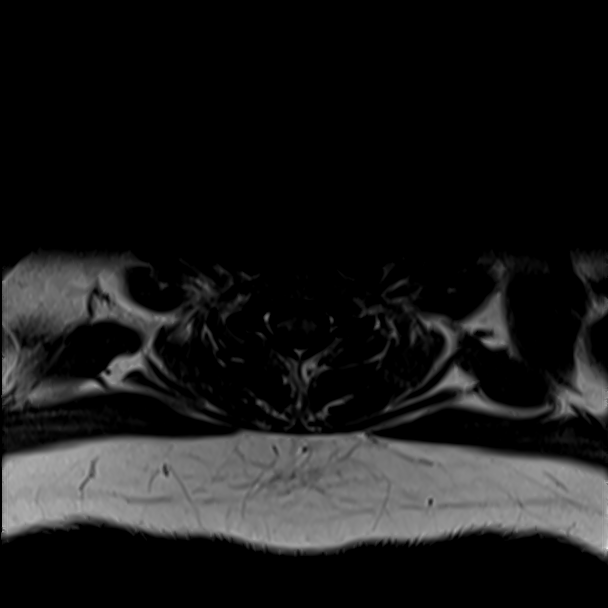

[Series 49: T1 fat-sat post-contrast · sagittal · 3.0mm · 1.06mm/px · 4 of 17 slices shown]
[im 1/17]
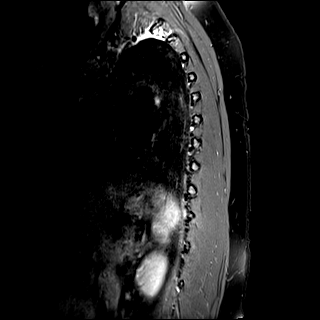
[im 6/17]
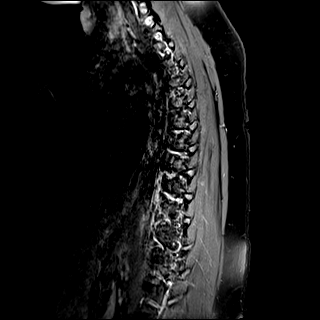
[im 11/17]
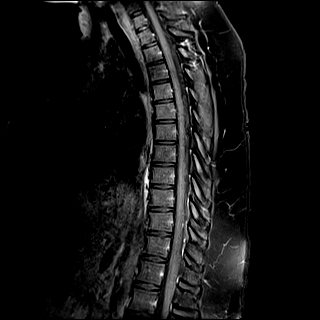
[im 17/17]
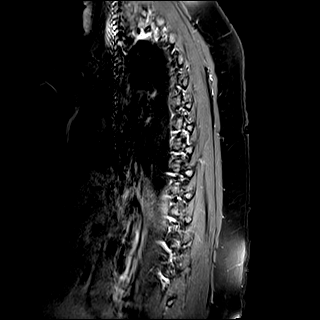

[28 of 48 positions shown; findings below may reference images not displayed]

FINDINGS: MRI CERVICAL SPINE FINDINGS

Alignment: Normal.

Vertebrae: Normal bone marrow signal intensity. No focal osseous
lesion.

Cord: Dorsal left hemicord lesion at the C2 level ([DATE]). Left
hemicord T2 hyperintensity at the C4 level ([DATE]). No abnormal
enhancement.

Posterior Fossa, vertebral arteries: Redemonstration of pontine
demyelinating lesion. Normal flow voids.

Disc levels: Disc spaces are preserved. No significant disc bulge.
Patent spinal canal and neural foramen.

Paraspinal tissues: Negative.

MRI THORACIC SPINE FINDINGS

Alignment:  Normal.

Vertebrae: Normal bone marrow signal intensity. Small C6 hemangioma.

Cord: Normal signal and morphology. No focal cord lesions or
abnormal enhancement.

Paraspinal and other soft tissues: Negative.

Disc levels:

No significant spinal canal or neural foraminal narrowing. Disc
spaces are preserved.
IMPRESSION: MRI cervical spine:

Left hemicord lesions at the C2 and C4 level. No abnormal
enhancement.

MRI thoracic spine:

No thoracic cord lesions.  No abnormal enhancement.

## 2021-03-09 ENCOUNTER — Ambulatory Visit: Payer: Medicaid Other | Admitting: Licensed Clinical Social Worker

## 2021-03-09 DIAGNOSIS — F331 Major depressive disorder, recurrent, moderate: Secondary | ICD-10-CM

## 2021-03-09 DIAGNOSIS — F411 Generalized anxiety disorder: Secondary | ICD-10-CM

## 2021-03-09 NOTE — Progress Notes (Signed)
Counselor/Therapist Progress Note  Patient ID: Brenda Fuentes, MRN: [id number],    Date: 03/09/2021  Time Spent: 45 minutes    Treatment Type: Psychotherapy  Reported Symptoms: continued mood stability/static  Mental Status Exam:  Appearance:   NA     Behavior:  Appropriate and Sharing  Motor:  NA  Speech/Language:   Clear and Coherent and Normal Rate  Affect:  NA  Mood:  dysthymic  Thought process:  normal  Thought content:    WNL  Sensory/Perceptual disturbances:    WNL  Orientation:  oriented to person, place, time/date and situation  Attention:  Good  Concentration:  Good  Memory:  WNL  Fund of knowledge:   Good  Insight:    Fair  Judgment:   Good  Impulse Control:  Good   Risk Assessment: Danger to Self:  No Self-injurious Behavior: No Danger to Others: No Duty to Warn:no Physical Aggression / Violence:No  Access to Firearms a concern: No  Gang Involvement:No   Subjective: Patient was engaged and cooperative throughout the session using time effectively to discuss thoughts and feelings. Patient voices continued motivation for treatment and understanding of depression and anxiety issues. Patient is likely to benefit from future treatment because she remains motivated to manage symptoms and maintain functioning.     Interventions: Cognitive Behavioral Therapy and Motivational Interviewing Established psychological safety. Checked in with patient regarding her week. Engaged patient in processing current psychosocial stressors, ambivalence related to career values. Explored patient's ambivalence using OARS, identifying change talk and supporting patients goals.   Provided support through active listening, validation of feelings, and highlighted patient's strengths.   Diagnosis:   ICD-10-CM   1. Generalized anxiety disorder  F41.1   2. Major depressive disorder, recurrent episode, moderate (HCC)  F33.1    Plan: Patient's goal: to be in a better head space, figure  out who she is as a person, to be more confident, to do more for herself, find herself.  Increase sense of self -"who am I"  Values clarification   Assist patient in developing goal and value directions   Explore and teach problem solving skills  Assertiveness communication  Monitor progress and discuss barriers   Increase mood regulationand decrease anxiety  Questioning and challenging thoughts  Cognitive restructuring   Mindfulness strategies  Self-care strategies  Help patient to develop reality-based, positive cognitive messages  Calming techniques PMR and deep breathing  Future Appointments  Date Time Provider Department Center  03/10/2021  2:30 PM Baird Kay, PT [facility] None  03/16/2021  3:00 PM Kathreen Cosier, LCSW [facility] None  03/17/2021  2:30 PM Temple Pacini R, PT [facility] None  03/23/2021  1:45 PM Temple Pacini R, PT [facility] None  03/25/2021  1:45 PM Temple Pacini R, PT [facility] None  03/30/2021  1:45 PM Baird Kay, PT [facility] None  04/05/2021  2:30 PM Baird Kay, PT [facility] None  04/07/2021  2:30 PM Baird Kay, PT [facility] None    Kathreen Cosier, LCSW

## 2021-03-10 ENCOUNTER — Ambulatory Visit: Payer: Medicaid Other

## 2021-03-10 ENCOUNTER — Other Ambulatory Visit: Payer: Self-pay

## 2021-03-10 DIAGNOSIS — R2681 Unsteadiness on feet: Secondary | ICD-10-CM | POA: Diagnosis not present

## 2021-03-10 DIAGNOSIS — M6281 Muscle weakness (generalized): Secondary | ICD-10-CM

## 2021-03-10 DIAGNOSIS — R2689 Other abnormalities of gait and mobility: Secondary | ICD-10-CM

## 2021-03-10 NOTE — Therapy (Signed)
[facility] MAIN [facility] SERVICES [address] [place], [place], [address] Phone: [phone]   Fax:  [phone]  Physical Therapy Treatment  Patient Details  Name: Brenda Fuentes MRN: [id number] Date of Birth: 06-Nov-1996 Referring Provider (PT): Marian Sorrow   Encounter Date: 03/10/2021   PT End of Session - 03/10/21 1554    Visit Number 9    Number of Visits 25    Date for PT Re-Evaluation 03/17/21    Authorization Type eval: 01/12    PT Start Time 1431    PT Stop Time 1515    PT Time Calculation (min) 44 min    Equipment Utilized During Treatment Gait belt    Activity Tolerance Patient tolerated treatment well;Patient limited by fatigue    Behavior During Therapy [facility] for tasks assessed/performed           Past Medical History:  Diagnosis Date  . Anxiety   . Depression 2016  . Gestational hypertension   . Hypercholesteremia 2009    Past Surgical History:  Procedure Laterality Date  . NO PAST SURGERIES      There were no vitals filed for this visit.   Subjective Assessment - 03/10/21 1434    Subjective Pt rates neck pain today 4/10. Pt reports restarting infusions tomorrow.    Pertinent History 25 year old female with history of migraines, depression, anxiety, vitamin D deficiency and recent diagnosis of multiple sclerosis relapsing remitting presented to the ED on 09/14/20 for four weeks of left sided and trunk paresthesias and weakness as well a Lhermitte's phenomenon, gait imbalance, blurry vision and pain with eye movements. Outpatient MRI brain and cervical spine was obtained which was concerning for MS with some enhancing lesions. Initial plan was for home health IV steroids, but she presented to the ED after they were not initiated and she had lack of response to oral steroids. Screening labs revealed a positive quantiferon gold prompting initiation of isoniazid and rifapentine. She was treated with IV steroids with some  improvement in symptoms. At her first clinic visit we planned to initiate Tysabri due to active MS with spinal cord involvement. Tysabri was started on 12/16 and she unfortunately experienced new symptoms of left eye pain and blurry vision two weeks ago. Therefore there is concern for a clinical relapse prior to therapy. She was referred to physical therapy for gait. MRI C/T spine w/ and w/out contract on 09/15/20 was positive for the following: Hyperintense lesion within the spinal cord at C2-C3 with enhancement concerning for active demyelination.    Limitations Sitting;Reading;Lifting;Standing;Walking    How long can you sit comfortably? 1 hour    How long can you stand comfortably? 10-30 mins    How long can you walk comfortably? short distances with [place]/cane for 1 hour    Patient Stated Goals "I want to get stronger so I can play with my kids"    Currently in Pain? Yes    Pain Score 4     Pain Location Neck    Pain Onset Today    Pain Onset 1 to 4 weeks ago          TREATMENT  Neuro: CGA provided for all of the following:  Standing on airex with core twists 10x  Standing on airex pad with vertical head turns 10x  Standing on airex pad, EC 2x30 sec   Tandem stance on airex, 2x30 sec with each LE as stance leg.   SLB 2x30 sec each  LE, CGA; no loss of stability   Ambulating with weaving around cones 6x; pt reports neck pain with looking down at cones at end of exercise.  Therex:  Seated upper trap stretch - 4x30 sec B  Cervical AROM flexion/extension/lateral flexion and rotation; pt assessed due to reports of posterior neck pain affecting her ability to look down when pt trying to ambulate/scan for obstacles. Pain also extends into upper trap muscles.  AAROM cervical rotation, flexion/extension and lateral flexion; pt reports pain feels achey and "swollen" (no swelling noted throughout cervical or shoulder musculature); pt says this pain has been going on for months.  Chin  tucks for pain modulation 1x5, 1x10 with TC/VC/demo. Discontinued as pt reports slight increase in pain  Levator scap stretch 2x30 sec B VC/demo provided for technique; pt with no reports of pain with stretch  Rhomboid stretch attempted for 20 sec, but pt with pain in L upper trap with exercise. VC/demo/TC for technique.  Scapular elevation isometrics on L side for pain modulation 5x5 second isometric. Pt reports decrease in pain.  Palpation of L upper trap, pt with 2 trigger points and is TTP throughout.   Education provided throughout session with VC/TC to facilitate proper technique at target joints and with correct muscular activation.   Assessment: Pt has progressed with static balance and does not require intermittent UE support with any exercises on the airex pad this session, indicating improved static balance. Pt did have posterior neck pain near occiput and into L upper trap with cervical flexion when trying to navigate cones. Pt TTP throughout L upper trap with 2 noted trigger points and pt reported reduction in sx with scapular elevation isometrics. The pt will continue to benefit from further skilled therapy to improve balance, mobility and pain to increase QOL.    PT Education - 03/10/21 1556    Education Details technique with levator scap stretch, body mechanics    Person(s) Educated Patient    Methods Explanation;Verbal cues;Demonstration    Comprehension Verbalized understanding;Returned demonstration            PT Short Term Goals - 12/23/20 1826      PT SHORT TERM GOAL #1   Title Patient will be independent in home exercise program to improve strength/mobility for better functional independence with ADLs.    Time 6    Period Weeks    Status New    Target Date 02/03/21             PT Long Term Goals - 12/23/20 1827      PT LONG TERM GOAL #1   Title Patient will increase FOTO score to equal to or greater than 57 to demonstrate statistically significant  improvement in mobility and quality of life.    Baseline 01/12: 48    Time 12    Period Weeks    Status New    Target Date 03/17/21      PT LONG TERM GOAL #2   Title Patient (< 16 years old) will complete five times sit to stand test in < 10 seconds indicating an increased LE strength and improved balance.    Baseline 01/12: 13.98s    Time 12    Period Weeks    Status New    Target Date 03/17/21      PT LONG TERM GOAL #3   Title Patient will increase Berg Balance score by > 6 points to demonstrate decreased fall risk during functional activities.    Baseline 01/12:  48/56    Time 12    Period Weeks    Status New    Target Date 03/17/21      PT LONG TERM GOAL #4   Title Patient will increase six minute walk test distance to >1000 for progression to community ambulator and improve gait ability    Baseline 01/12: 915 feet without AD    Time 12    Period Weeks    Status New    Target Date 03/17/21                 Plan - 03/10/21 1554    Clinical Impression Statement Pt has progressed with static balance and does not require intermittent UE support with any exercises on the airex pad this session, indicating improved static balance. Pt did have posterior neck pain near occiput and into L upper trap with cervical flexion when trying to navigate cones. Pt TTP throughout L upper trap with 2 noted trigger points and pt reported reduction in sx with scapular elevation isometrics. The pt will continue to benefit from further skilled therapy to improve balance, mobility and pain to increase QOL.    Personal Factors and Comorbidities Comorbidity 3+;Time since onset of injury/illness/exacerbation;Fitness    Comorbidities migraines, depression, anxiety, vitamin D deficiency, hypercholestermia, and recent diagnosis of multiple sclerosis relapsing remitting    Examination-Activity Limitations Stairs;Stand;Sit;Bend;Lift    Examination-Participation Restrictions School;Community Activity     Stability/Clinical Decision Making Evolving/Moderate complexity    Rehab Potential Fair    PT Frequency 2x / week    PT Duration 12 weeks    PT Treatment/Interventions ADLs/Self Care Home Management;Aquatic Therapy;Biofeedback;Gait training;Stair training;Functional mobility training;Therapeutic activities;Therapeutic exercise;Balance training;Neuromuscular re-education;Patient/family education;Manual techniques;Passive range of motion;Joint Manipulations;Energy conservation    PT Next Visit Plan strength training, dynamic balance, advance strength training of hip musculature; dynamic balance/assess goals    Consulted and Agree with Plan of Care Patient           Patient will benefit from skilled therapeutic intervention in order to improve the following deficits and impairments:  Abnormal gait,Decreased activity tolerance,Decreased endurance,Decreased strength,Impaired sensation,Decreased balance,Decreased coordination,Decreased mobility,Decreased safety awareness,Difficulty walking,Increased muscle spasms  Visit Diagnosis: Muscle weakness (generalized)  Unsteadiness on feet  Other abnormalities of gait and mobility     Problem List Patient Active Problem List   Diagnosis Date Noted  . 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 Vitamin D deficiency 10/09/2019  . Generalized anxiety disorder 05/02/2019  . Major depressive disorder, recurrent episode, moderate (HCC) 05/02/2019  . Anxiety and depression 04/23/2019  . History of preterm delivery 02/18/2019  . History of gestational hypertension 02/18/2019   Temple Pacini PT, DPT 03/10/2021, 3:57 PM  [place] [facility] MAIN [facility] SERVICES [address] [place], [place], [address] Phone: [phone]   Fax:  [phone]  Name: LARUEN RISSER MRN: [id number] Date of Birth: 10-08-1996

## 2021-03-11 NOTE — Telephone Encounter (Signed)
Appt scheduled Richmond Campbell, RN

## 2021-03-16 ENCOUNTER — Ambulatory Visit: Payer: Medicaid Other | Admitting: Licensed Clinical Social Worker

## 2021-03-17 ENCOUNTER — Ambulatory Visit: Payer: Medicaid Other | Attending: Neurology

## 2021-03-17 DIAGNOSIS — R2689 Other abnormalities of gait and mobility: Secondary | ICD-10-CM | POA: Insufficient documentation

## 2021-03-17 NOTE — Therapy (Signed)
[place] MAIN [facility] SERVICES [address] [place], [place], [address] Phone: [phone]   Fax:  [phone]  Physical Therapy Treatment/DISCHARGE SUMMARY  Patient Details  Name: Brenda Fuentes MRN: [id number] Date of Birth: 1996-11-14 Referring Provider (PT): Darlin Priestly   Encounter Date: 03/17/2021   PT End of Session - 03/17/21 1548    Visit Number 10    Number of Visits 25    Date for PT Re-Evaluation 03/17/21    Authorization Type eval: 01/12    PT Start Time 1433    PT Stop Time 1516    PT Time Calculation (min) 43 min    Equipment Utilized During Treatment Gait belt    Activity Tolerance Patient tolerated treatment well    Behavior During Therapy [facility] for tasks assessed/performed           Past Medical History:  Diagnosis Date  . Anxiety   . Depression 2016  . Gestational hypertension   . Hypercholesteremia 2009    Past Surgical History:  Procedure Laterality Date  . NO PAST SURGERIES      There were no vitals filed for this visit.   Subjective Assessment - 03/17/21 1523    Subjective Pt reports 7/10 migraine pain today. Pt says she feels she is doing well in terms of strength and balance.    Pertinent History 25 year old female with history of migraines, depression, anxiety, vitamin D deficiency and recent diagnosis of multiple sclerosis relapsing remitting presented to the ED on 09/14/20 for four weeks of left sided and trunk paresthesias and weakness as well a Lhermitte's phenomenon, gait imbalance, blurry vision and pain with eye movements. Outpatient MRI brain and cervical spine was obtained which was concerning for MS with some enhancing lesions. Initial plan was for home health IV steroids, but she presented to the ED after they were not initiated and she had lack of response to oral steroids. Screening labs revealed a positive quantiferon gold prompting initiation of isoniazid and rifapentine. She was treated with IV  steroids with some improvement in symptoms. At her first clinic visit we planned to initiate Tysabri due to active MS with spinal cord involvement. Tysabri was started on 12/16 and she unfortunately experienced new symptoms of left eye pain and blurry vision two weeks ago. Therefore there is concern for a clinical relapse prior to therapy. She was referred to physical therapy for gait. MRI C/T spine w/ and w/out contract on 09/15/20 was positive for the following: Hyperintense lesion within the spinal cord at C2-C3 with enhancement concerning for active demyelination.    Limitations Sitting;Reading;Lifting;Standing;Walking    How long can you sit comfortably? 1 hour    How long can you stand comfortably? 10-30 mins    How long can you walk comfortably? short distances with Stanish/cane for 1 hour    Patient Stated Goals "I want to get stronger so I can play with my kids"    Currently in Pain? Yes    Pain Score 7     Pain Location --   migraine   Pain Onset Today    Pain Onset 1 to 4 weeks ago              [facility] PT Assessment - 03/17/21 0001      Berg Balance Test   Sit to Stand Able to stand without using hands and stabilize independently    Standing Unsupported Able to stand safely 2 minutes    Sitting  with Back Unsupported but Feet Supported on Floor or Stool Able to sit safely and securely 2 minutes    Stand to Sit Sits safely with minimal use of hands    Transfers Able to transfer safely, minor use of hands    Standing Unsupported with Eyes Closed Able to stand 10 seconds safely    Standing Unsupported with Feet Together Able to place feet together independently and stand 1 minute safely    From Standing, Reach Forward with Outstretched Arm Can reach confidently >25 cm (10")    From Standing Position, Pick up Object from Floor Able to pick up shoe safely and easily    From Standing Position, Turn to Look Behind Over each Shoulder Looks behind from both sides and weight shifts well     Turn 360 Degrees Able to turn 360 degrees safely in 4 seconds or less    Standing Unsupported, Alternately Place Feet on Step/Stool Able to stand independently and safely and complete 8 steps in 20 seconds    Standing Unsupported, One Foot in Front Able to place foot tandem independently and hold 30 seconds    Standing on One Leg Able to lift leg independently and hold > 10 seconds    Total Score 56          TREATMENT Therex: FOTO: 53 5xSTS:  Trial 1 (warm-up) 11.7 sec Trial 2: 9.08 sec 6MWT: 1,215 ft  Neuro Re-ed: Merrilee Jansky: 56/56   Access Code: [email or web address] URL: [email or web address] Date: 03/17/2021 Prepared by: Ricard Dillon  Pt wrote note to do all balance exercises at support surface with chair behind her. Exercises  . Single Leg Stance with Support - 1 x daily - 7 x weekly - 2 sets - 2 reps - 30 hold . Standing Tandem Balance with Counter Support - 1 x daily - 7 x weekly - 2 sets - 2 reps - 30 hold . Romberg Stance with Eyes Closed - 1 x daily - 7 x weekly - 2 sets - 2 reps - 30 hold . Squat with Chair Touch - 1 x daily - 4 x weekly - 3 sets - 15 reps . Seated Hip Abduction with Resistance - 1 x daily - 4 x weekly - 3 sets - 15 reps . Standing Hip Abduction Kicks - 1 x daily - 4 x weekly - 3 sets - 15 reps Seated Knee Extension with Resistance - 1 x daily - 4 x weekly - 3 sets - 15 reps  Additionally: provided drawing of back pain modulation exercise where pt supports BUEs on table and slides forward/backward to increase/decrease spinal extension/flexion.     Assessment: Pt has met 4/5 therapy goals and partially met 5th therapy goal (FOTO 53%). Pt has seen improvements in BLE power (5xSTS 9.08 sec), balance Merrilee Jansky 56/56), and gait capacity/endurance (6MWT 1,215 ft) this reporting period. PT reviewed new HEP exercises for pt to perform to maintain gains of therapy beyond therapy. PT instructed pt in exercise progression including proper rest/frequency. Pt verbalized  understanding for all instruction/education. The pt reports she feels independent with continuing HEP beyond therapy. Pt also instructed to contact PT should she have any concerns/questions following d/c. The pt no longer requires skilled PT at this time.     PT Education - 03/17/21 1531    Education Details Reviewed new HEP with pt so pt can maintain gains of therapy beyond therapy. Educated pt on reassessment findings, indications for progress in therapy and d/c from therapy/POC.  Person(s) Educated Patient    Methods Explanation;Demonstration;Verbal cues;Handout    Comprehension Verbalized understanding;Returned demonstration            PT Short Term Goals - 03/17/21 1550      PT SHORT TERM GOAL #1   Title Patient will be independent in home exercise program to improve strength/mobility for better functional independence with ADLs.    Baseline 4/6: Pt reports she has "not been doing much" at home due to other responsibilities, but reports she feels independent/confident to do HEP on her own. Pt issued new HEP to maintain gains of therapy beyond therapy and PT reviewed exercises with pt where pt verbalized understanding.    Time 6    Period Weeks    Status Achieved    Target Date 02/03/21             PT Long Term Goals - 03/17/21 1439      PT LONG TERM GOAL #1   Title Patient will increase FOTO score to equal to or greater than 57 to demonstrate statistically significant improvement in mobility and quality of life.    Baseline 01/12: 48 4/6: 53    Time 12    Period Weeks    Status Partially Met      PT LONG TERM GOAL #2   Title Patient (< 29 years old) will complete five times sit to stand test in < 10 seconds indicating an increased LE strength and improved balance.    Baseline 01/12: 13.98s 4/6: 9.08 seconds    Time 12    Period Weeks    Status Achieved      PT LONG TERM GOAL #3   Title Patient will increase Berg Balance score by > 6 points to demonstrate decreased  fall risk during functional activities.    Baseline 01/12: 48/56 4/6: 56/56    Time 12    Period Weeks    Status Achieved      PT LONG TERM GOAL #4   Title Patient will increase six minute walk test distance to >1000 for progression to community ambulator and improve gait ability    Baseline 01/12: 915 feet without AD; 4/6: 1215 ft    Time 12    Period Weeks    Status Achieved            Patient will benefit from skilled therapeutic intervention in order to improve the following deficits and impairments:  Abnormal gait,Decreased activity tolerance,Decreased endurance,Decreased strength,Impaired sensation,Decreased balance,Decreased coordination,Decreased mobility,Decreased safety awareness,Difficulty walking,Increased muscle spasms  Visit Diagnosis: Other abnormalities of gait and mobility     Problem List Patient Active Problem List   Diagnosis Date Noted  . 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 Vitamin D deficiency 10/09/2019  . Generalized anxiety disorder 05/02/2019  . Major depressive disorder, recurrent episode, moderate ([place]) 05/02/2019  . Anxiety and depression 04/23/2019  . History of preterm delivery 02/18/2019  . History of gestational hypertension 02/18/2019   Ricard Dillon PT, DPT 03/17/2021, 4:01 PM  [place] MAIN [facility] SERVICES [address] [place], [place], [address] Phone: [phone]   Fax:  [phone]  Name: Brenda Fuentes MRN: [id number] Date of Birth: Jul 04, 1996

## 2021-03-23 ENCOUNTER — Ambulatory Visit: Payer: Medicaid Other

## 2021-03-25 ENCOUNTER — Ambulatory Visit: Payer: Medicaid Other

## 2021-03-29 ENCOUNTER — Ambulatory Visit: Payer: Medicaid Other | Admitting: Licensed Clinical Social Worker

## 2021-03-29 DIAGNOSIS — F411 Generalized anxiety disorder: Secondary | ICD-10-CM

## 2021-03-29 DIAGNOSIS — F331 Major depressive disorder, recurrent, moderate: Secondary | ICD-10-CM

## 2021-03-29 NOTE — Progress Notes (Signed)
Counselor/Therapist Progress Note  Patient ID: KYAN GIANNONE, MRN: [id number],    Date: 03/29/2021  Time Spent: 45 minutes  Treatment Type: Psychotherapy  Reported Symptoms: Obsessive thinking, Anhedonia, Sleep disturbance, Fatigue, Physical aches and pain and anxiety, anxious thoughts, depressed mood  Mental Status Exam:  Appearance:   NA     Behavior:  Appropriate and Sharing  Motor:  NA  Speech/Language:   Clear and Coherent and Normal Rate  Affect:  NA  Mood:  dysthymic  Thought process:  goal directed  Thought content:    WNL  Sensory/Perceptual disturbances:    WNL  Orientation:  oriented to person, place, time/date and situation  Attention:  Good  Concentration:  Good  Memory:  WNL  Fund of knowledge:   Good  Insight:    Good  Judgment:   Good  Impulse Control:  Good   Risk Assessment: Danger to Self:  No Self-injurious Behavior: No Danger to Others: No Duty to Warn:no Physical Aggression / Violence:No  Access to Firearms a concern: No  Gang Involvement:No   Subjective: Patient was engaged and cooperative throughout the session using time effectively to discuss thoughts and feelings. Patient voices continued motivation for treatment and understanding of depression and anxiety issues. Patient is likely to benefit from future treatment because she remains motivated to decrease depression and anxiety and reports benefit of regular sessions in addressing these symptoms.   Interventions: Cognitive Behavioral Therapy Established psychological safety. Checked in with patient regarding current psychosocial stressors, continued depression and anxiety symptoms due to multiple stressors. Engaged patient in processing these stressors, providing supportive space for patient to verbally ventilate and validating patient's feelings of frustration. Discussed options with patient to increase support with child care and  and to develop more of a plan or a routine. Provided support  through active listening, validation of feelings, and highlighted patient's strengths.   Diagnosis:   ICD-10-CM   1. Generalized anxiety disorder  F41.1   2. Major depressive disorder, recurrent episode, moderate (HCC)  F33.1    Plan: Patient's goal: to be in a better head space, figure out who she is as a person, to be more confident, to do more for herself, find herself.  Increase sense of self -"who am I"  Values clarification   Assist patient in developing goal and value directions   Explore and teach problem solving skills  Assertiveness communication  Monitor progress and discuss barriers   Increase mood regulationand decrease anxiety  Questioning and challenging thoughts  Cognitive restructuring   Mindfulness strategies  Self-care strategies  Help patient to develop reality-based, positive cognitive messages  Calming techniques PMR and deep breathing  Future Appointments  Date Time Provider Department Center  04/05/2021  1:00 PM [facility] NURSE [facility] None  04/12/2021  2:00 PM Kathreen Cosier, LCSW [facility] None   Kathreen Cosier, LCSW

## 2021-03-30 ENCOUNTER — Ambulatory Visit: Payer: Medicaid Other

## 2021-04-05 ENCOUNTER — Ambulatory Visit: Payer: Medicaid Other

## 2021-04-07 ENCOUNTER — Telehealth: Payer: Self-pay

## 2021-04-07 ENCOUNTER — Ambulatory Visit: Payer: Medicaid Other

## 2021-04-07 DIAGNOSIS — R7612 Nonspecific reaction to cell mediated immunity measurement of gamma interferon antigen response without active tuberculosis: Secondary | ICD-10-CM

## 2021-04-07 NOTE — Telephone Encounter (Signed)
Attempted TC to patient re: missed TB appt on 04/05/21. Left message that TB RN will f/u with Dr. Anda Kraft .  Patient has missed 2 TB med appts.  Started LTBI tx on 11/01/20 and needed to have completed tx by 05/01/21. Reminder text was sent 2 hours before 04/05/21 appt with no response.  Faxed letter sent to Dr. Sampson Goon at [facility] See scanned. Richmond Campbell, RN

## 2021-04-12 ENCOUNTER — Ambulatory Visit: Payer: Medicaid Other | Admitting: Licensed Clinical Social Worker

## 2021-04-20 ENCOUNTER — Ambulatory Visit: Payer: Medicaid Other | Admitting: Licensed Clinical Social Worker

## 2021-05-04 ENCOUNTER — Ambulatory Visit: Payer: Medicaid Other | Admitting: Licensed Clinical Social Worker

## 2021-05-04 DIAGNOSIS — F411 Generalized anxiety disorder: Secondary | ICD-10-CM

## 2021-05-04 DIAGNOSIS — F331 Major depressive disorder, recurrent, moderate: Secondary | ICD-10-CM

## 2021-05-04 NOTE — Progress Notes (Signed)
Counselor/Therapist Progress Note  Patient ID: Brenda Fuentes, MRN: [id number],    Date: 05/04/2021  Time Spent: 45 minutes    Treatment Type: Psychotherapy  Reported Symptoms: Feelings of Worthlessness, Hopelessness, Obsessive thinking, Anhedonia, Fatigue, Physical aches and pain and depressed mood, anxiety; overall increase in symptoms  Mental Status Exam:  Appearance:   NA     Behavior:  Appropriate and Sharing  Motor:  NA  Speech/Language:   Clear and Coherent and Normal Rate  Affect:  NA  Mood:  normal  Thought process:  goal directed  Thought content:    WNL  Sensory/Perceptual disturbances:    WNL  Orientation:  oriented to person, place, time/date, situation and day of week  Attention:  Good  Concentration:  Good  Memory:  WNL  Fund of knowledge:   Good  Insight:    Good  Judgment:   Good  Impulse Control:  Good   Risk Assessment: Danger to Self:  No Self-injurious Behavior: No Danger to Others: No Duty to Warn:no Physical Aggression / Violence:No  Access to Firearms a concern: No  Gang Involvement:No   Subjective: Patient was engaged and cooperative throughout the session using time effectively to discuss thoughts and feelings. Patient voices continued motivation for treatment and understanding of depression and anxiety. Patient is likely to benefit from future treatment because she remains motivated to improve mood and functioning and reports benefit from sessions. She also reports benefit from med management and voices a recent increase in dosage.   Interventions: Cognitive Behavioral Therapy Established psychological safety. Checked in with patient regarding her current mood and psychosocial stressors, increased depression and anxiety symptoms due to multiple stressors. Provided supportive space encouraging emotional release and processing of current psychosocial stressors. Explored options for patient to increase self-care, including engaging in meaningful  activities and eating and drinking in a healthy consistent way. Also explored options for patient to increase communication within her marriage. Encouraged patient to continue to work in her garden as she reports benefits from this. Provided support through active listening, validation of feelings, and highlighted patient's strengths.   Diagnosis:   ICD-10-CM   1. Generalized anxiety disorder  F41.1   2. Major depressive disorder, recurrent episode, moderate (HCC)  F33.1    Plan: Patient's goal: to be in a better head space, figure out who she is as a person, to be more confident, to do more for herself, find herself.  Increase sense of self -"who am I"  Values clarification   Assist patient in developing goal and value directions   Explore and teach problem solving skills  Assertiveness communication  Monitor progress and discuss barriers   Increase mood regulationand decrease anxiety  Questioning and challenging thoughts  Cognitive restructuring   Mindfulness strategies  Self-care strategies  Help patient to develop reality-based, positive cognitive messages  Calming techniques PMR and deep breathing  Future Appointments  Date Time Provider Department Center  05/18/2021  2:00 PM Kathreen Cosier, LCSW [facility] None    Kathreen Cosier, LCSW

## 2021-05-18 ENCOUNTER — Ambulatory Visit: Payer: Medicaid Other | Admitting: Licensed Clinical Social Worker

## 2021-05-25 ENCOUNTER — Ambulatory Visit: Payer: Medicaid Other | Admitting: Licensed Clinical Social Worker

## 2021-06-02 ENCOUNTER — Telehealth: Payer: Self-pay

## 2021-06-02 DIAGNOSIS — R7612 Nonspecific reaction to cell mediated immunity measurement of gamma interferon antigen response without active tuberculosis: Secondary | ICD-10-CM

## 2021-06-07 NOTE — Telephone Encounter (Signed)
TC from patient.  TB RN  informed patient that records received from Dr. Nemiah Commander at [facility] and stated that she was interested in LTBI tx again.  Also informed patient that her PCP stated in her records that patient had side effects with Rifampin even though patient completed 3 months of therapy.  TB RN inquired re: side effects.  Patient stated that the Rifampin gave her body odor.  Discussed with patient that LTBI is a commitment and is not required by law that she completes tx.  Patient would like to proceed with INH. Verified medications. Will discuss further with Dr. Alvester Morin. Richmond Campbell, RN

## 2021-06-07 NOTE — Telephone Encounter (Signed)
[facility] provider completed an additional QFT. Please see Care everywhere for provider notes and labs. Richmond Campbell, RN

## 2021-06-09 ENCOUNTER — Other Ambulatory Visit: Payer: Self-pay

## 2021-06-09 DIAGNOSIS — R7612 Nonspecific reaction to cell mediated immunity measurement of gamma interferon antigen response without active tuberculosis: Secondary | ICD-10-CM

## 2021-06-09 NOTE — Progress Notes (Signed)
Tuberculosis treatment orders  All patients are to be monitored per [place] and county TB policies.   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 Treat for latent TB per the following:  Isoniazid 300mg  daily (1 tab PO QD) x 6 months Pyroxidine 25mg  daily (1 tab PO QD) x 6 months, Draw LFTs monthly per Dr.  +QFT 05/17/21 and 08/2020 - See care everywhere CXR 09/15/2020  Will need baseline labs (CBC and LFTs) at TB start appt   No changes to EPI as of 06/09/21

## 2021-06-09 NOTE — Progress Notes (Signed)
Attestation of Medical Director for TB RN: I agree with the care provided to this patient and was available for any consultation.  I was consulted and documentation reflects my recommendations.  ° °Twain Stenseth [place] Cheron Coryell, MD, MPH, ABFM °[facility] Medical Director ° °

## 2021-06-14 ENCOUNTER — Telehealth: Payer: Self-pay

## 2021-06-15 NOTE — Telephone Encounter (Signed)
TC with patient.  States had covid test today and will know results in 1-2 days. TB RN will call back the end of the week to schedule TBM appt Richmond Campbell, RN

## 2021-06-22 ENCOUNTER — Ambulatory Visit: Payer: Medicaid Other | Admitting: Licensed Clinical Social Worker

## 2021-06-22 DIAGNOSIS — F411 Generalized anxiety disorder: Secondary | ICD-10-CM

## 2021-06-22 DIAGNOSIS — F331 Major depressive disorder, recurrent, moderate: Secondary | ICD-10-CM

## 2021-06-22 NOTE — Progress Notes (Signed)
Counselor/Therapist Progress Note  Patient ID: Brenda Fuentes, MRN: [id number],    Date: 06/22/2021  Time Spent: 46 minutes    Treatment Type: Psychotherapy  Reported Symptoms: Obsessive thinking, Sleep disturbance, Appetite disturbance, Fatigue, Physical aches and pain, and low mood, anxiety, anxious thoughts , irritability, overwhelmed; increased motivation and goal directed behaviors   Mental Status Exam:  Appearance:   NA     Behavior:  Appropriate and Sharing  Motor:  NA  Speech/Language:   Clear and Coherent and Normal Rate  Affect:  NA  Mood:  normal  Thought process:  goal directed  Thought content:    WNL  Sensory/Perceptual disturbances:    WNL  Orientation:  oriented to person, place, time/date, situation, and day of week  Attention:  Good  Concentration:  Good  Memory:  WNL  Fund of knowledge:   Good  Insight:    Good  Judgment:   Good  Impulse Control:  Good   Risk Assessment: Danger to Self:  No Self-injurious Behavior: No Danger to Others: No Duty to Warn:no Physical Aggression / Violence:No  Access to Firearms a concern: No  Gang Involvement:No   Subjective: Patient was engaged and cooperative throughout the session using time effectively to discuss thoughts and feelings. Patient voices continued motivation for treatment and understanding of depression and anxiety issues. Patient is likely to benefit from future treatment because she remains motivated to decrease mood and anxiety issues and reports benefit of regular sessions in addressing these symptoms.   Interventions: Cognitive Behavioral Therapy Established psychological safety. Checked in with patient and engaged her in processing current psychosocial stressors, continued anxiety and depression issues due to multiple stressors. Explored patient's concerns regarding other possible mental health diagnosis - Primary care physician has referred patient for further evaluation. Provided supportive space  for patient to verbally ventilate. Explored patient's concerns around her son starting school, validating patient's feelings of anxiety and reframing unhelpful thoughts. Provided support through active listening, validation of feelings, and highlighted patient's strengths.   Diagnosis:   ICD-10-CM   1. Generalized anxiety disorder  F41.1     2. Major depressive disorder, recurrent episode, moderate (HCC)  F33.1      Plan: Patient's goal: to be in a better head space, figure out who she is as a person, to be more confident, to do more for herself, find herself.    Increase sense of self - "who am I"  Values clarification Assist patient in developing goal and value directions Explore and teach problem solving skills Assertiveness communication Monitor progress and discuss barriers   Increase mood regulation and decrease anxiety  Questioning and challenging thoughts Cognitive restructuring Mindfulness strategies Self-care strategies  Help patient to develop reality-based, positive cognitive messages Calming techniques PMR and deep breathing   Future Appointments  Date Time Provider Department Center  07/06/2021  2:00 PM Kathreen Cosier, LCSW [facility] None    Kathreen Cosier, LCSW

## 2021-06-26 NOTE — Telephone Encounter (Signed)
TBM start scheduled for 07/05/21 Richmond Campbell, RN

## 2021-07-06 ENCOUNTER — Ambulatory Visit: Payer: Medicaid Other | Admitting: Licensed Clinical Social Worker

## 2021-07-06 DIAGNOSIS — F331 Major depressive disorder, recurrent, moderate: Secondary | ICD-10-CM

## 2021-07-06 DIAGNOSIS — F411 Generalized anxiety disorder: Secondary | ICD-10-CM

## 2021-07-06 NOTE — Progress Notes (Signed)
Counselor/Therapist Progress Note  Patient ID: Brenda Fuentes, MRN: [id number],    Date: 07/06/2021  Time Spent: 44 minutes   Treatment Type: Psychotherapy  Reported Symptoms: Fatigue, Physical aches and pain, and low mood, anxiety, anxious thoughts  Mental Status Exam:  Appearance:   NA     Behavior:  Appropriate and Sharing  Motor:  NA  Speech/Language:   Clear and Coherent and Normal Rate  Affect:  NA  Mood:  normal  Thought process:  normal  Thought content:    WNL  Sensory/Perceptual disturbances:    WNL  Orientation:  oriented to person, place, time/date, and situation  Attention:  Good  Concentration:  Good  Memory:  WNL  Fund of knowledge:   Good  Insight:    Good  Judgment:   Good  Impulse Control:  Good   Risk Assessment: Danger to Self:  No Self-injurious Behavior: No Danger to Others: No Duty to Warn:no Physical Aggression / Violence:No  Access to Firearms a concern: No  Gang Involvement:No   Subjective: Patient was engaged and cooperative throughout the session using time effectively to discuss thoughts and feelings. Patient voices continued motivation for treatment and understanding of depression and anxiety. Patient is likely to benefit from future treatment because she remains motivated to decrease symptoms and improve functioning.   Interventions: Cognitive Behavioral Therapy Established psychological safety. Checked in with patient regarding her week. Provided supportive space encouraging emotional release and processing of current psychosocial stressors, continued depressive symptoms and anxiety due to multiple stressors. Validated patient's feelings of distress. Reviewed the need to increase self-care. Provided support through active listening, validation of feelings, and highlighted patient's strengths.   Diagnosis:   ICD-10-CM   1. Generalized anxiety disorder  F41.1     2. Major depressive disorder, recurrent episode, moderate (HCC)  F33.1       Plan: Patient's goal: to be in a better head space, figure out who she is as a person, to be more confident, to do more for herself, find herself.    Increase sense of self - "who am I"  Values clarification Assist patient in developing goal and value directions Explore and teach problem solving skills Assertiveness communication Monitor progress and discuss barriers   Increase mood regulation and decrease anxiety  Questioning and challenging thoughts Cognitive restructuring Mindfulness strategies Self-care strategies  Help patient to develop reality-based, positive cognitive messages Calming techniques PMR and deep breathing  Future Appointments  Date Time Provider Department Center  07/27/2021  2:00 PM Kathreen Cosier, LCSW [facility] None    Kathreen Cosier, LCSW

## 2021-07-07 ENCOUNTER — Telehealth: Payer: Self-pay

## 2021-07-08 NOTE — Telephone Encounter (Signed)
Left message for patient's PCP (Dr. Nemiah Commander) to call TB RN back.Richmond Campbell, RN

## 2021-07-08 NOTE — Telephone Encounter (Signed)
Phone message from [place] at [facility].  States she spoke to patient about her missed TB appt this week and patient stated she didn't have child care.  Patient agrees to call [facility] to r/s her TB med appt Richmond Campbell, RN

## 2021-07-27 ENCOUNTER — Ambulatory Visit: Payer: Medicaid Other | Admitting: Licensed Clinical Social Worker

## 2021-07-27 DIAGNOSIS — F411 Generalized anxiety disorder: Secondary | ICD-10-CM

## 2021-07-27 DIAGNOSIS — F331 Major depressive disorder, recurrent, moderate: Secondary | ICD-10-CM

## 2021-07-27 NOTE — Progress Notes (Signed)
Counselor/Therapist Progress Note  Patient ID: Brenda Fuentes, MRN: [id number],    Date: 07/27/2021  Time Spent: 40 minutes    Treatment Type: Psychotherapy  Reported Symptoms: Feelings of Worthlessness, Hopelessness, Anhedonia, Fatigue, Physical aches and pain, and depressed mood; anxiety, anxious thoughts, difficulties concentrating  Mental Status Exam:  Appearance:   NA     Behavior:  Appropriate and Sharing  Motor:  NA  Speech/Language:   Clear and Coherent and Normal Rate  Affect:  NA  Mood:  normal  Thought process:  normal  Thought content:    WNL  Sensory/Perceptual disturbances:    WNL  Orientation:  oriented to person, place, time/date, and situation  Attention:  Fair  Concentration:  Fair  Memory:  WNL  Fund of knowledge:   Good  Insight:    Good  Judgment:   Good  Impulse Control:  Good   Risk Assessment: Danger to Self:  No Self-injurious Behavior: No Danger to Others: No Duty to Warn:no Physical Aggression / Violence:No  Access to Firearms a concern: No  Gang Involvement:No   Subjective: Patient had minimal participation and engagement throughout the session- responding when probed.   Patient voices motivation for treatment but struggles with follow through and change. Patient reports taking medication as prescribed.    Interventions: Cognitive Behavioral Therapy and Motivational Interviewing Established psychological safety. Checked in with patient and engaged her in processing current psychosocial stressors, continued depressive symptoms and anxiety due to health issues and multiple stressors. Discussed patient's challenges with motivation, difficulties with self-care, and beliefs she has any control to change her circumstances. Used MI to identify and develop a plan to address sleep hygiene. Provided support through active listening, validation of feelings, and highlighted patient's strengths.   Diagnosis:   ICD-10-CM   1. Generalized anxiety disorder   F41.1     2. Major depressive disorder, recurrent episode, moderate (HCC)  F33.1      Plan: Patient's goal: to be in a better head space, figure out who she is as a person, to be more confident, to do more for herself, find herself.    Increase sense of self - "who am I"  Values clarification Assist patient in developing goal and value directions Explore and teach problem solving skills Assertiveness communication Monitor progress and discuss barriers   Increase mood regulation and decrease anxiety  Questioning and challenging thoughts Cognitive restructuring Mindfulness strategies Self-care strategies  Help patient to develop reality-based, positive cognitive messages Calming techniques PMR and deep breathing  Future Appointments  Date Time Provider Department Center  08/02/2021  2:00 PM Kathreen Cosier, LCSW [facility] None    Kathreen Cosier, [place]

## 2021-08-02 ENCOUNTER — Ambulatory Visit: Payer: Medicaid Other | Admitting: Licensed Clinical Social Worker

## 2021-08-18 ENCOUNTER — Ambulatory Visit: Payer: Medicaid Other | Admitting: Licensed Clinical Social Worker

## 2021-08-18 DIAGNOSIS — F411 Generalized anxiety disorder: Secondary | ICD-10-CM

## 2021-08-18 DIAGNOSIS — F331 Major depressive disorder, recurrent, moderate: Secondary | ICD-10-CM

## 2021-08-18 NOTE — Progress Notes (Signed)
Counselor/Therapist Progress Note  Patient ID: Brenda Fuentes, MRN: [id number],    Date: 08/18/2021  Time Spent: 48 minutes    Treatment Type: Psychotherapy  Reported Symptoms: Sleep disturbance, Fatigue, Physical aches and pain, Verbal aggression, and mood instability- mood shifts, anger, irritability  Mental Status Exam:  Appearance:   NA     Behavior:  Appropriate and Sharing  Motor:  NA  Speech/Language:   Normal Rate  Affect:  NA  Mood:  normal  Thought process:  normal  Thought content:    WNL  Sensory/Perceptual disturbances:    WNL  Orientation:  oriented to person, place, time/date, and situation  Attention:  Fair  Concentration:  Fair  Memory:  WNL  Fund of knowledge:   Good  Insight:    Good  Judgment:   Good  Impulse Control:  Good   Risk Assessment: Danger to Self:  No Self-injurious Behavior: No Danger to Others: No Duty to Warn:no Physical Aggression / Violence:No  Access to Firearms a concern: No  Gang Involvement:No   Subjective: Patient was engaged and cooperative throughout the session using time effectively to discuss thoughts and feelings. Patient voices continued motivation for treatment and understanding of mood and anxiety issues. Patient is likely to benefit from future treatment because she remains motivated to decrease symptoms. Patient has also initiated treatment with psychiatry and is changing medication to Abilify due to possible diagnosis of bipolar disorder.    Interventions: Cognitive Behavioral Therapy Established psychological safety. Checked in with patient regarding her week. Discussed patient's psychiatry and neurology appointments. Explored patient's thoughts about possibly transferring talk-therapy. Engaged patient in processing current psychosocial stressors, including marital problems and challenges with health. Social worker provided emotional support, validation and normalization of feelings and empathy. Explored patient's  options for additioanl suports. Provided support through active listening, validation of feelings, and highlighted patient's strengths.   Diagnosis:   ICD-10-CM   1. Generalized anxiety disorder  F41.1     2. Major depressive disorder, recurrent episode, moderate (HCC)  F33.1      Plan: Patient's goal: to be in a better head space, figure out who she is as a person, to be more confident, to do more for herself, find herself.    Increase sense of self - "who am I"  Values clarification Assist patient in developing goal and value directions Explore and teach problem solving skills Assertiveness communication Monitor progress and discuss barriers   Increase mood regulation and decrease anxiety  Questioning and challenging thoughts Cognitive restructuring Mindfulness strategies Self-care strategies  Help patient to develop reality-based, positive cognitive messages Calming techniques PMR and deep breathing  No future appointments.  Kathreen Cosier, LCSW

## 2021-09-06 ENCOUNTER — Ambulatory Visit: Payer: Medicaid Other | Admitting: Licensed Clinical Social Worker

## 2021-09-06 DIAGNOSIS — F411 Generalized anxiety disorder: Secondary | ICD-10-CM

## 2021-09-06 DIAGNOSIS — F331 Major depressive disorder, recurrent, moderate: Secondary | ICD-10-CM

## 2021-09-06 NOTE — Progress Notes (Signed)
Counselor/Therapist Progress Note  Patient ID: Brenda Fuentes, MRN: [id number],    Date: 09/06/2021  Time Spent: 40 minutes    Treatment Type: Psychotherapy  Reported Symptoms: Anhedonia, Sleep disturbance, Isolation and withdrawal, Fatigue, Physical aches and pain, Verbal aggression, and irritability, easily frustrated, overwhelm  Mental Status Exam:  Appearance:   NA     Behavior:  Appropriate and Sharing  Motor:  Normal  Speech/Language:   Clear and Coherent and Normal Rate  Affect:  NA  Mood:  depressed  Thought process:  normal  Thought content:    WNL  Sensory/Perceptual disturbances:    WNL  Orientation:  oriented to person, place, time/date, and situation  Attention:  Good  Concentration:  Good  Memory:  WNL  Fund of knowledge:   Good  Insight:    Fair  Judgment:   Good  Impulse Control:  Good   Risk Assessment: Danger to Self:  No Self-injurious Behavior: No Danger to Others: No Duty to Warn:no Physical Aggression / Violence:No  Access to Firearms a concern: No  Gang Involvement:No   Subjective: Patient was engaged and cooperative throughout the session using time effectively to discuss thoughts and feelings. Patient voices continued mood and anxiety symptoms and reports struggling  to manage symptoms. Patient may benefit from a higher level of care and is recommended to continue to work with her psychiatrist for medication management. Patient reports some benefit of sessions.    Interventions: Cognitive Behavioral Therapy Established psychological safety. Checked in with patient. Explored current psychosocial stressors continued mood and anxiety issues due to multiple stressors, including chronic health issues, marital conflict, and parenting challenges. Led patient in values activity, identifying life domains that are of importance to focus on and began discussing the parenting domain. Provided support through active listening, validation of feelings, and  highlighted patient's strengths.   Diagnosis:   ICD-10-CM   1. Generalized anxiety disorder  F41.1     2. 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    Increase sense of self - "who am I"  Values clarification Assist patient in developing goal and value directions Explore and teach problem solving skills Assertiveness communication Monitor progress and discuss barriers   Increase mood regulation and decrease anxiety  Questioning and challenging thoughts Cognitive restructuring Mindfulness strategies Self-care strategies  Help patient to develop reality-based, positive cognitive messages Calming techniques PMR and deep breathing  Future Appointments  Date Time Provider Department Center  09/14/2021  1:00 PM Kathreen Cosier, LCSW [facility] None    Kathreen Cosier, LCSW

## 2021-09-13 ENCOUNTER — Ambulatory Visit: Payer: Medicaid Other | Admitting: Licensed Clinical Social Worker

## 2021-09-14 ENCOUNTER — Ambulatory Visit: Payer: Medicaid Other | Admitting: Licensed Clinical Social Worker

## 2021-09-14 DIAGNOSIS — F411 Generalized anxiety disorder: Secondary | ICD-10-CM

## 2021-09-14 DIAGNOSIS — F331 Major depressive disorder, recurrent, moderate: Secondary | ICD-10-CM

## 2021-09-14 NOTE — Progress Notes (Signed)
Counselor/Therapist Progress Note  Patient ID: Brenda Fuentes, MRN: [id number],    Date: 09/14/2021  Time Spent: 45 minutes    Treatment Type: Psychotherapy  Reported Symptoms:  mild improvement in symptoms, improvement in sleep and energy level, continued need to regulate irritability issues occurring at home   Mental Status Exam:  Appearance:   NA     Behavior:  Appropriate and Sharing  Motor:  NA  Speech/Language:   Normal Rate  Affect:  NA  Mood:  normal  Thought process:  normal  Thought content:    WNL  Sensory/Perceptual disturbances:    WNL  Orientation:  oriented to person, place, time/date, situation, and day of week  Attention:  Good  Concentration:  Good  Memory:  WNL  Fund of knowledge:   Good  Insight:    Fair  Judgment:   Good  Impulse Control:  Good   Risk Assessment: Danger to Self:  No Self-injurious Behavior: No Danger to Others: No Duty to Warn:no Physical Aggression / Violence:No  Access to Firearms a concern: No  Gang Involvement:No   Subjective: Patient was receptive to feedback and intervention from LCSW and actively and effectively participated throughout the session. Patient voices continued motivation for treatment and understanding of mood and anxiety issues.  Patient is likely to benefit from future treatment because she remains motivated to manage symptoms and improve functioning and reports benefit of regular sessions in addressing symptoms. Patient reports that she is receiving Med management from Donny Pique, [facility] recently began Adderall and Abilify and reports benefit.   Interventions: Cognitive Behavioral Therapy Checked in with patient regarding her week. Reviewed previous session regarding Values exploration. Engaged patient in continuing explore Values, assisting patient in identifying what is important, what things are going well and that patient wants to address. As a homework task patient was encouraged to  follow through with her idea of creating a cleaning schedule and to create a schedule for bills. Provided support through active listening, validation of feelings, and highlighted patient's strengths.   Diagnosis:   ICD-10-CM   1. Generalized anxiety disorder  F41.1     2. 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 Focus on Marriage next session.    Patient's goal: to be in a better head space, figure out who she is as a person, to be more confident, to do more for herself, find herself.    Increase sense of self - "who am I"  Values clarification Assist patient in developing goal and value directions Explore and teach problem solving skills Assertiveness communication Monitor progress and discuss barriers   Increase mood regulation and decrease anxiety  Questioning and challenging thoughts Cognitive restructuring Mindfulness strategies Self-care strategies  Help patient to develop reality-based, positive cognitive messages Calming techniques PMR and deep breathing  Future Appointments  Date Time Provider Department Center  09/20/2021  1:00 PM Kathreen Cosier, LCSW [facility] None    Kathreen Cosier, LCSW

## 2021-09-20 ENCOUNTER — Ambulatory Visit: Payer: Medicaid Other | Admitting: Licensed Clinical Social Worker

## 2021-10-12 ENCOUNTER — Ambulatory Visit: Payer: Medicaid Other | Admitting: Licensed Clinical Social Worker

## 2021-10-12 DIAGNOSIS — F411 Generalized anxiety disorder: Secondary | ICD-10-CM

## 2021-10-12 DIAGNOSIS — F331 Major depressive disorder, recurrent, moderate: Secondary | ICD-10-CM

## 2021-10-12 NOTE — Progress Notes (Signed)
Counselor/Therapist Progress Note  Patient ID: Brenda Fuentes, MRN: [id number],    Date: 10/12/2021  Time Spent: 45 minutes   Treatment Type: Psychotherapy  Reported Symptoms:  low mood, sadness, "board"; Overwhelmed; Ranks mood at 8 out of 10 with 10 being the worst; has had passive suicidal thoughts in the past few weeks denies intent or plan  Mental Status Exam:  Appearance:   NA     Behavior:  Appropriate and Sharing  Motor:  NA  Speech/Language:   Normal Rate  Affect:  NA  Mood:  normal  Thought process:  normal  Thought content:    WNL  Sensory/Perceptual disturbances:    WNL  Orientation:  oriented to person, place, time/date, and situation  Attention:  Fair  Concentration:  Fair  Memory:  WNL  Fund of knowledge:   Good  Insight:    Fair  Judgment:   Good  Impulse Control:  Good   Risk Assessment: Danger to Self:  No Self-injurious Behavior: No Danger to Others: No Duty to Warn:no Physical Aggression / Violence:No  Access to Firearms a concern: No  Gang Involvement:No   Subjective: Patient was engaged and cooperative throughout the session using time effectively to discuss thoughts and feelings. Patient was receptive to feedback and intervention from LCSW. Patient voices continued motivation for treatment but has minimal support to help with patient's needs. Patient is likely to benefit from future treatment because they remain motivated to decrease symptoms and reports some benefit from the combination of medication and talk-therapy.       Interventions: Cognitive Behavioral Therapy and Person Centered Established psychological safety. Checked in with patient regarding their week. LCSW provided supportive space for patient to process current psychosocial stressors, continued depression and anxiety due to daily stressors with marriage, parenting and running the household. Validated patient's feelings of sadness and frustration.  LCSW taught patient about gratitude  statements and encouraged patient as homework to write 1-3 things each day that she feels good about. LCSW also reviewed the importance of engaging with/in positive supportive relationships and pleasurable activities. Provided support through active listening, validation of feelings, and highlighted patient's strengths.   Diagnosis:   ICD-10-CM   1. Generalized anxiety disorder  F41.1     2. 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 Focus on Marriage next session.    Patient's goal: to be in a better head space, figure out who she is as a person, to be more confident, to do more for herself, find herself.    Increase sense of self - "who am I"  Values clarification Assist patient in developing goal and value directions Explore and teach problem solving skills Assertiveness communication Monitor progress and discuss barriers   Increase mood regulation and decrease anxiety  Questioning and challenging thoughts Cognitive restructuring Mindfulness strategies Self-care strategies  Help patient to develop reality-based, positive cognitive messages Calming techniques PMR and deep breathing Gratitude statements   Future Appointments  Date Time Provider Department Center  10/26/2021  1:00 PM Kathreen Cosier, LCSW [facility] None    Kathreen Cosier, LCSW

## 2021-10-26 ENCOUNTER — Ambulatory Visit: Payer: Medicaid Other | Admitting: Licensed Clinical Social Worker

## 2021-11-02 ENCOUNTER — Ambulatory Visit: Payer: Medicaid Other | Admitting: Licensed Clinical Social Worker

## 2021-11-23 ENCOUNTER — Ambulatory Visit: Payer: Medicaid Other | Admitting: Licensed Clinical Social Worker

## 2021-11-23 DIAGNOSIS — F411 Generalized anxiety disorder: Secondary | ICD-10-CM

## 2021-11-23 DIAGNOSIS — F331 Major depressive disorder, recurrent, moderate: Secondary | ICD-10-CM

## 2021-11-23 NOTE — Progress Notes (Signed)
Counselor/Therapist Progress Note  Patient ID: Brenda Fuentes, MRN: [id number],    Date: 11/23/2021  Time Spent: 47 minutes    Treatment Type: Psychotherapy  Reported Symptoms: Panic attacks and anxiety, anxious thoughts, irritability  Mental Status Exam:  Appearance:   NA     Behavior:  Appropriate  Motor:  NA  Speech/Language:   Clear and Coherent and Normal Rate  Affect:  NA  Mood:  normal  Thought process:  normal  Thought content:    WNL  Sensory/Perceptual disturbances:    WNL  Orientation:  oriented to person, place, time/date, and situation  Attention:  Fair  Concentration:  Fair  Memory:  WNL  Fund of knowledge:   Good  Insight:    Fair  Judgment:   Good  Impulse Control:  Good   Risk Assessment: Danger to Self:  No Self-injurious Behavior: No Danger to Others: No Duty to Warn:no Physical Aggression / Violence:No  Access to Firearms a concern: No  Gang Involvement:No   Subjective: Patient was engaged throughout the session using time effectively to discuss thoughts and feelings. Patient voices continued motivation for treatment but has not made significant progress towards goal of treatment. Patient voices plans to discuss other options for therapy with her psychiatrist at this Friday's appt. And also discuss non-compliance with medication management. Pt. In agreement to come in to sign consent before we schedule next appt.   Interventions: Cognitive Behavioral Therapy and Motivational Interviewing Checked in with patient regarding their week. LCSW assisted patient in processing their thoughts and emotions about what they are experiencing as a parent, marital problems, lack of support and challenges with school. Explored patient's experiences, attempting to identify points of intervention. Discussed lack of progress in treatment and options for patient to seek treatment through [facility]. Explored patient's lack of medication compliance and  encouraged her to take medication as prescribed (hasnt been taking Ritalin) Informed patient that she needs to sign [facility] treatment consent before we schedule next appointment. Provided support through active listening, validation of feelings, and highlighted patient's strengths. LCSW informed patient of PTO.   Diagnosis:   ICD-10-CM   1. Generalized anxiety disorder  F41.1     2. Major depressive disorder, recurrent episode, moderate (HCC)  F33.1      Plan: Patient's goal: to be in a better head space, figure out who she is as a person, to be more confident, to do more for herself, find herself.    Increase sense of self - who am I  Values clarification Assist patient in developing goal and value directions Explore and teach problem solving skills Assertiveness communication Monitor progress and discuss barriers   Increase mood regulation and decrease anxiety  Questioning and challenging thoughts Cognitive restructuring Mindfulness strategies Self-care strategies  Help patient to develop reality-based, positive cognitive messages Calming techniques PMR and deep breathing Gratitude statements   Patient needs to sign [facility] treatment consent and call LCSW to schedule next appointment for mid January.   No future appointments.  Kathreen Cosier, LCSW

## 2022-05-11 ENCOUNTER — Emergency Department
Admission: EM | Admit: 2022-05-11 | Discharge: 2022-05-11 | Disposition: A | Discharge status: Home / Self Care | Payer: Medicaid Other | Attending: Emergency Medicine | Admitting: Emergency Medicine

## 2022-05-11 ENCOUNTER — Encounter: Payer: Self-pay | Admitting: Emergency Medicine

## 2022-05-11 DIAGNOSIS — J069 Acute upper respiratory infection, unspecified: Secondary | ICD-10-CM

## 2022-05-11 DIAGNOSIS — H60502 Unspecified acute noninfective otitis externa, left ear: Secondary | ICD-10-CM

## 2022-05-11 DIAGNOSIS — H9202 Otalgia, left ear: Secondary | ICD-10-CM | POA: Diagnosis present

## 2022-05-11 DIAGNOSIS — Z20822 Contact with and (suspected) exposure to covid-19: Secondary | ICD-10-CM | POA: Diagnosis not present

## 2022-05-11 DIAGNOSIS — H6092 Unspecified otitis externa, left ear: Secondary | ICD-10-CM | POA: Insufficient documentation

## 2022-05-11 LAB — RESP PANEL BY RT-PCR (FLU A&B, COVID) ARPGX2
Influenza A by PCR: NEGATIVE
Influenza B by PCR: NEGATIVE
SARS Coronavirus 2 by RT PCR: NEGATIVE

## 2022-05-11 MED ORDER — ACETAMINOPHEN 325 MG PO TABS
650.0000 mg | ORAL_TABLET | Freq: Once | ORAL | Status: AC
Start: 2022-05-11 — End: 2022-05-11
  Administered 2022-05-11: 650 mg via ORAL
  Filled 2022-05-11: qty 2

## 2022-05-11 MED ORDER — CIPROFLOXACIN-DEXAMETHASONE 0.3-0.1 % OT SUSP: 4.0000 [drp] | Freq: Two times a day (BID) | OTIC | 0 refills | Status: DC

## 2022-05-11 NOTE — ED Notes (Signed)
Pt c/o sinus and chest congestion since Friday, having left ear pain and loss of hearing in the left ear since yesterday.Marland Kitchen

## 2022-05-11 NOTE — Discharge Instructions (Signed)
You may use antibiotic drops as prescribed.  Please return for any new, worsening, or change in symptoms or other concerns.

## 2022-05-11 NOTE — ED Triage Notes (Signed)
Pt presents via POV with complaints of left ear pain. She notes having ear infections in the past and recently had a cold. Denies any drainage.

## 2022-05-11 NOTE — ED Provider Notes (Signed)
[facility] Provider Note    Event Date/Time   First MD Initiated Contact with Patient 05/11/22 [phone]     (approximate)   History   Otalgia   HPI  Brenda Fuentes is a 26 y.o. female who presents today for evaluation of left ear pain.  Patient reports that she has had a stuffy nose, runny nose, sore throat for the past few days and then last night her ear began to hurt.  She reports that she has had multiple ear infections in the past and today feels similar.  She reports that she has any pain when touching her ear when laying on her ear when she was trying to sleep last night.  She has not noticed otorrhea.  She has not had any fevers or chills.  No voice change, no trouble swallowing, no cough, no chest pain or shortness of breath.  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 09/22/2020   Vitamin D deficiency 10/09/2019   Generalized anxiety disorder 05/02/2019   Major depressive disorder, recurrent episode, moderate (HCC) 05/02/2019   Anxiety and depression 04/23/2019   History of preterm delivery 02/18/2019   History of gestational hypertension 02/18/2019          Physical Exam   Triage Vital Signs: ED Triage Vitals  Enc Vitals Group     BP 05/11/22 0416 (!) 154/96     Pulse Rate 05/11/22 0416 99     Resp 05/11/22 0416 20     Temp 05/11/22 0416 98 F (36.7 C)     Temp Source 05/11/22 0416 Oral     SpO2 05/11/22 0416 96 %     Weight 05/11/22 0417 216 lb (98 kg)     Height 05/11/22 0417 5\' 6"  (1.676 m)     Head Circumference --      Peak Flow --      Pain Score 05/11/22 0417 9     Pain Loc --      Pain Edu? --      Excl. in GC? --     Most recent vital signs: Vitals:   05/11/22 0726 05/11/22 0728  BP:  (!) 148/90  Pulse:  (!) 104  Resp: 18 18  Temp:    SpO2:  98%    Physical Exam Vitals and nursing note reviewed.   Constitutional:      General: Awake and alert. No acute distress.    Appearance: Normal appearance. She is well-developed and normal weight.  HENT:     Head: Normocephalic and atraumatic.    Left ear: No Effusion or bulging, normal light reflex. Canal is edematous and irritated. Pain with manipulation of pinna and tragus.  No mastoid tenderness or erythema.  No proptosis of pinna Right ear is normal    Mouth: Mucous membranes are moist.  Eyes:     General: PERRL. Normal EOMs        Right eye: No discharge.        Left eye: No discharge.     Conjunctiva/sclera: Conjunctivae normal.  Cardiovascular:     Rate and Rhythm: Normal rate and regular rhythm.     Pulses: Normal pulses.  Pulmonary:     Effort: Pulmonary effort is normal. No respiratory distress.  Abdominal:     Abdomen is soft. There is no abdominal tenderness. Musculoskeletal:        General: No swelling.  Normal range of motion.     Cervical back: Normal range of motion and neck supple.  Lymphadenopathy:     Cervical: No cervical adenopathy.  Skin:    General: Skin is warm and dry.     Capillary Refill: Capillary refill takes less than 2 seconds.     Findings: No rash.  Neurological:     Mental Status: she is alert.      ED Results / Procedures / Treatments   Labs (all labs ordered are listed, but only abnormal results are displayed) Labs Reviewed  RESP PANEL BY RT-PCR (FLU A&B, COVID) [id number]     EKG     RADIOLOGY     PROCEDURES:  Critical Care performed:   Procedures   MEDICATIONS ORDERED IN ED: Medications  acetaminophen (TYLENOL) tablet 650 mg (650 mg Oral Given 05/11/22 0739)     IMPRESSION / MDM / ASSESSMENT AND PLAN / ED COURSE  I reviewed the triage vital signs and the nursing notes.   Differential diagnosis includes, but is not limited to, otitis externa, otitis media, cerumen impaction, eustachian tube dysfunction.  Patient is awake and alert, hemodynamically stable and afebrile.   Her TM is intact with no posterior effusion, no erythema.  No mastoid tenderness or erythema no proptosis of pinna to suggest mastoiditis.  No history of diabetes to suggest malignant otitis externa.  She was started on Ciprodex drops.  She was also tested for COVID/flu/RSV though did not wish to wait for the results.  This returned negative.  We discussed return precautions and outpatient follow-up.  Patient understands and agrees with plan.  Discharged in stable condition.   Patient's presentation is most consistent with acute, uncomplicated illness.    FINAL CLINICAL IMPRESSION(S) / ED DIAGNOSES   Final diagnoses:  Acute otitis externa of left ear, unspecified type  Upper respiratory tract infection, unspecified type     Rx / DC Orders   ED Discharge Orders          Ordered    ciprofloxacin-dexamethasone (CIPRODEX) OTIC suspension  2 times daily        05/11/22 0733             Note:  This document was prepared using Dragon voice recognition software and may include unintentional dictation errors.   Keturah Shavers 05/11/22 1257    Sharman Cheek, MD 05/11/22 1547

## 2022-11-29 ENCOUNTER — Ambulatory Visit
Admission: RE | Admit: 2022-11-29 | Discharge: 2022-11-29 | Disposition: A | Discharge status: Home / Self Care | Payer: BC Managed Care – PPO | Source: Ambulatory Visit | Attending: Student | Admitting: Student

## 2022-11-29 ENCOUNTER — Other Ambulatory Visit: Payer: Self-pay | Admitting: Student

## 2022-11-29 DIAGNOSIS — M7989 Other specified soft tissue disorders: Secondary | ICD-10-CM

## 2022-12-07 ENCOUNTER — Encounter: Payer: Self-pay | Admitting: Certified Nurse Midwife

## 2022-12-07 ENCOUNTER — Ambulatory Visit (INDEPENDENT_AMBULATORY_CARE_PROVIDER_SITE_OTHER): Payer: BC Managed Care – PPO | Admitting: Certified Nurse Midwife

## 2022-12-07 VITALS — BP 116/78 | HR 103 | Resp 16 | Wt 213.7 lb

## 2022-12-07 DIAGNOSIS — Z30431 Encounter for routine checking of intrauterine contraceptive device: Secondary | ICD-10-CM

## 2022-12-07 DIAGNOSIS — Z8489 Family history of other specified conditions: Secondary | ICD-10-CM

## 2022-12-07 DIAGNOSIS — R102 Pelvic and perineal pain: Secondary | ICD-10-CM

## 2022-12-07 NOTE — Progress Notes (Signed)
GYN ENCOUNTER NOTE  Subjective:       Brenda Fuentes is a 26 y.o. [phone] female is here for gynecologic evaluation of the following issues:  1. Pelvic pain, concerned for fibroid uterus. Has family history, has heavy periods 2. IUD placement.has had para guard IUD x 3 yrs, wants to make sure everything is ok.     Gynecologic History Patient's last menstrual period was 11/16/2022 (exact date). Contraception: IUD Last Pap: 02/18/2019. Results were: normal Last mammogram: n/a.   Obstetric History OB History  Gravida Para Term Preterm AB Living  2 2 1 1   2   SAB IAB Ectopic Multiple Live Births        0 2    # Outcome Date GA Lbr Len/2nd Weight Sex Delivery Anes PTL Lv  2 Term 08/20/19 [redacted]w[redacted]d 04:36 / 00:18 6 lb 10.2 oz (3.01 kg) F Vag-Spont EPI  LIV  1 Preterm 07/04/16 [redacted]w[redacted]d 14:25 / 00:23 5 lb 10.3 oz (2.56 kg) M Vag-Spont EPI  LIV    Past Medical History:  Diagnosis Date   Anxiety    Depression 2016   Gestational hypertension    Hypercholesteremia 2009    Past Surgical History:  Procedure Laterality Date   NO PAST SURGERIES      Current Outpatient Medications on File Prior to Visit  Medication Sig Dispense Refill   ciprofloxacin-dexamethasone (CIPRODEX) OTIC suspension Place 4 drops into the left ear 2 (two) times daily. 7.5 mL 0   citalopram (CELEXA) 20 MG tablet Take by mouth daily.      ELDERBERRY PO Take 1 tablet by mouth 1 day or 1 dose. (Patient not taking: Reported on 12/30/2020)     gabapentin (NEURONTIN) 300 MG capsule Take by mouth 2 (two) times daily.      HYDROcodone-acetaminophen (NORCO/VICODIN) 5-325 MG tablet Take 1 tablet by mouth every 6 (six) hours as needed for moderate pain. 8 tablet 0   Multiple Vitamins-Minerals (WOMENS MULTIVITAMIN PO) Take 2 tablets by mouth. 2 gummies per day     natalizumab (TYSABRI) 300 MG/15ML injection Inject into the vein. Infusion every 4 weeks, unsure of start date     omeprazole (PRILOSEC) 20 MG capsule Take by mouth 2  (two) times daily before a meal.      PARAGARD INTRAUTERINE COPPER IU by Intrauterine route.     Prenatal Vit-Fe Fumarate-FA (PRENATAL MULTIVITAMIN) TABS tablet Take 1 tablet by mouth daily at 12 noon.     rizatriptan (MAXALT) 10 MG tablet Take by mouth as needed.      Vitamin D, Ergocalciferol, (DRISDOL) 1.25 MG (50000 UT) CAPS capsule Take 1 capsule (50,000 Units total) by mouth every 7 (seven) days. 30 capsule 1   No current facility-administered medications on file prior to visit.    Allergies  Allergen Reactions   Penicillins Hives    Social History   Socioeconomic History   Marital status: Married    Spouse name: Not on file   Number of children: Not on file   Years of education: Not on file   Highest education level: Not on file  Occupational History   Not on file  Tobacco Use   Smoking status: Former    Packs/day: 0.50    Types: Cigarettes    Quit date: 08/2018    Years since quitting: 4.3   Smokeless tobacco: Never  Vaping Use   Vaping Use: Former  Substance and Sexual Activity   Alcohol use: No    Comment: last ETOH  09/05/20   Drug use: No   Sexual activity: Yes    Birth control/protection: I.U.D.  Other Topics Concern   Not on file  Social History Narrative   Not on file   Social Determinants of Health   Financial Resource Strain: Not on file  Food Insecurity: Not on file  Transportation Needs: Not on file  Physical Activity: Not on file  Stress: Not on file  Social Connections: Not on file  Intimate Partner Violence: Not on file    Family History  Problem Relation Age of Onset   Hyperlipidemia Mother    Heart failure Father    Diabetes Father     The following portions of the patient's history were reviewed and updated as appropriate: allergies, current medications, past family history, past medical history, past social history, past surgical history and problem list.  Review of Systems Review of Systems - Negative except as mentioned in  HPI Review of Systems - General ROS: negative for - chills, fatigue, fever, hot flashes, malaise or night sweats Hematological and Lymphatic ROS: negative for - bleeding problems or swollen lymph nodes Gastrointestinal ROS: negative for - abdominal pain, blood in stools, change in bowel habits and nausea/vomiting Musculoskeletal ROS: negative for - joint pain, muscle pain or muscular weakness Genito-Urinary ROS: negative for - change in menstrual cycle, dysmenorrhea, dyspareunia, dysuria, genital discharge, genital ulcers, hematuria, incontinence, irregular/heavy menses, nocturia. Positive for  pelvic pain  Objective:   BP 116/78   Pulse (!) 103   Resp 16   Wt 213 lb 11.2 oz (96.9 kg)   LMP 11/16/2022 (Exact Date)   BMI 34.49 kg/m  CONSTITUTIONAL: Well-developed, well-nourished female in no acute distress.  HENT:  Normocephalic, atraumatic.  NECK: Normal range of motion, supple, no masses.  Normal thyroid.  SKIN: Skin is warm and dry. No rash noted. Not diaphoretic. No erythema. No pallor. NEUROLGIC: Alert and oriented to person, place, and time. PSYCHIATRIC: Normal mood and affect. Normal behavior. Normal judgment and thought content. CARDIOVASCULAR:Not Examined RESPIRATORY: Not Examined BREASTS: Not Examined ABDOMEN: Soft, non distended; Non tender.  No Organomegaly. PELVIC:  External Genitalia: Normal  BUS: Normal  Vagina: Normal  Cervix: Normal, IUD strings present   MUSCULOSKELETAL: Normal range of motion. No tenderness.  No cyanosis, clubbing, or edema.     Assessment:   Pelvic pain  IUD check    Plan:   Discussed evaluation for fibroids and IUD placement with IUD. Discussed Mirena IUD in place of ParaGard to help with bleeding. She states she did para guard due to hx of depression. Pt states she recently has had non epileptic seizures during her menstrual cycle. Discussed Mirena IUD better cycle control which may improve. U/s ordered to evaluate for fibroids and  IUD. Will follow up with results.   Doreene Burke, CNM

## 2022-12-22 ENCOUNTER — Ambulatory Visit (INDEPENDENT_AMBULATORY_CARE_PROVIDER_SITE_OTHER): Payer: BC Managed Care – PPO

## 2022-12-22 DIAGNOSIS — R102 Pelvic and perineal pain: Secondary | ICD-10-CM | POA: Diagnosis not present

## 2022-12-22 DIAGNOSIS — Z975 Presence of (intrauterine) contraceptive device: Secondary | ICD-10-CM | POA: Diagnosis not present

## 2022-12-22 DIAGNOSIS — Z8489 Family history of other specified conditions: Secondary | ICD-10-CM

## 2022-12-22 DIAGNOSIS — Z30431 Encounter for routine checking of intrauterine contraceptive device: Secondary | ICD-10-CM

## 2022-12-31 ENCOUNTER — Encounter: Payer: Self-pay | Admitting: Certified Nurse Midwife

## 2023-01-11 ENCOUNTER — Other Ambulatory Visit: Payer: Self-pay | Admitting: Pulmonary Disease

## 2023-01-11 DIAGNOSIS — R0600 Dyspnea, unspecified: Secondary | ICD-10-CM

## 2023-01-18 ENCOUNTER — Ambulatory Visit
Admission: RE | Admit: 2023-01-18 | Discharge: 2023-01-18 | Disposition: A | Discharge status: Home / Self Care | Payer: BC Managed Care – PPO | Source: Ambulatory Visit | Attending: Pulmonary Disease | Admitting: Pulmonary Disease

## 2023-01-18 DIAGNOSIS — R0600 Dyspnea, unspecified: Secondary | ICD-10-CM | POA: Insufficient documentation

## 2023-01-18 MED ORDER — IOHEXOL 350 MG/ML SOLN
75.0000 mL | Freq: Once | INTRAVENOUS | Status: AC | PRN
  Administered 2023-01-18: 75 mL via INTRAVENOUS

## 2023-01-20 ENCOUNTER — Ambulatory Visit: Payer: BC Managed Care – PPO | Admitting: Certified Nurse Midwife

## 2023-01-27 ENCOUNTER — Encounter: Payer: Self-pay | Admitting: Certified Nurse Midwife

## 2023-01-27 ENCOUNTER — Ambulatory Visit (INDEPENDENT_AMBULATORY_CARE_PROVIDER_SITE_OTHER): Payer: BC Managed Care – PPO | Admitting: Certified Nurse Midwife

## 2023-01-27 VITALS — BP 127/83 | HR 105 | Resp 15 | Wt 216.7 lb

## 2023-01-27 DIAGNOSIS — Z30432 Encounter for removal of intrauterine contraceptive device: Secondary | ICD-10-CM

## 2023-01-27 MED ORDER — NORETHINDRONE 0.35 MG PO TABS: 1.0000 | ORAL_TABLET | Freq: Every day | ORAL | 3 refills | Status: DC

## 2023-01-27 NOTE — Progress Notes (Signed)
   GYNECOLOGY OFFICE PROCEDURE NOTE  Brenda Fuentes is a 27 y.o. [phone] here for  IUD removal. No GYN concerns.  Last pap smear was on 02/18/2019 and was normal.  IUD Removal  Patient identified, informed consent performed, consent signed.  Patient was placed in the dorsal lithotomy position, normal external genitalia was noted.  A speculum was placed in the patient's vagina, normal discharge was noted, no lesions. The cervix was visualized, no lesions, no abnormal discharge.  The strings of the IUD were grasped and pulled using ring forceps. The IUD was removed in its entirety.  Patient tolerated the procedure well.    Patient will use POP for contraception.  Routine preventative health maintenance measures emphasized.   Philip Aspen, CNM

## 2023-01-28 NOTE — Addendum Note (Signed)
Addended by: Hildred Priest on: 01/28/2023 08:42 AM   Modules accepted: Orders

## 2023-01-30 ENCOUNTER — Encounter: Payer: Self-pay | Admitting: Certified Nurse Midwife

## 2023-02-05 ENCOUNTER — Encounter: Payer: Self-pay | Admitting: Certified Nurse Midwife

## 2023-02-06 ENCOUNTER — Other Ambulatory Visit: Payer: Self-pay | Admitting: Certified Nurse Midwife

## 2023-03-20 ENCOUNTER — Emergency Department: Payer: BC Managed Care – PPO

## 2023-03-20 ENCOUNTER — Encounter: Payer: Self-pay | Admitting: Emergency Medicine

## 2023-03-20 ENCOUNTER — Other Ambulatory Visit: Payer: Self-pay

## 2023-03-20 ENCOUNTER — Emergency Department
Admission: EM | Admit: 2023-03-20 | Discharge: 2023-03-20 | Disposition: A | Discharge status: Home / Self Care | Payer: BC Managed Care – PPO | Attending: Emergency Medicine | Admitting: Emergency Medicine

## 2023-03-20 DIAGNOSIS — W540XXA Bitten by dog, initial encounter: Secondary | ICD-10-CM | POA: Diagnosis not present

## 2023-03-20 DIAGNOSIS — S59911A Unspecified injury of right forearm, initial encounter: Secondary | ICD-10-CM | POA: Diagnosis present

## 2023-03-20 DIAGNOSIS — S51831A Puncture wound without foreign body of right forearm, initial encounter: Secondary | ICD-10-CM | POA: Insufficient documentation

## 2023-03-20 MED ORDER — DOXYCYCLINE HYCLATE 100 MG PO TABS: 100.0000 mg | ORAL_TABLET | Freq: Two times a day (BID) | ORAL | 0 refills | Status: DC

## 2023-03-20 NOTE — ED Triage Notes (Signed)
Patient to ED for dog bite on right arm. Bit by family members dog. Not 100% if vaccines are up to date. Bleeding controlled and arm wrapped PTA.

## 2023-03-20 NOTE — ED Provider Notes (Signed)
[facility] Provider Note  Patient Contact: 7:48 PM (approximate)   History   Animal Bite (/)   HPI  Brenda Fuentes is a 27 y.o. female who presents the emergency department after being bitten by family's dog to the right forearm.  Patient was bitten after she came onto the porch of a family member.  Patient thought that her daughter was currently in the house but daughter was actually in another house.  Typically this dog is created however the dog was freed to room about the house when she entered.  It did bite her 1 time on the forearm.  Patient reports that the family has no concern of rabies.  Animal is up-to-date on rabies vaccination.  Patient is up-to-date on her tetanus shot.  She has 2 puncture wounds to the anterior surface and 2 to the posterior surface of the forearm.     Physical Exam   Triage Vital Signs: ED Triage Vitals  Enc Vitals Group     BP 03/20/23 1742 (!) 145/94     Pulse Rate 03/20/23 1742 79     Resp 03/20/23 1742 18     Temp 03/20/23 1742 98.6 F (37 C)     Temp Source 03/20/23 1742 Oral     SpO2 03/20/23 1742 99 %     Weight --      Height --      Head Circumference --      Peak Flow --      Pain Score 03/20/23 1741 8     Pain Loc --      Pain Edu? --      Excl. in GC? --     Most recent vital signs: Vitals:   03/20/23 1742  BP: (!) 145/94  Pulse: 79  Resp: 18  Temp: 98.6 F (37 C)  SpO2: 99%     General: Alert and in no acute distress.  Cardiovascular:  Good peripheral perfusion Respiratory: Normal respiratory effort without tachypnea or retractions. Lungs CTAB. Musculoskeletal: Full range of motion to all extremities.  Visual patient in the right forearm reveals 2 puncture wounds to the dorsal and ventral aspect of the forearm.  No active bleeding.  No visible foreign body.  Full range of motion to the elbow and wrist.  Sensation intact. Neurologic:  No gross focal neurologic deficits are appreciated.   Skin:   No rash noted Other:   ED Results / Procedures / Treatments   Labs (all labs ordered are listed, but only abnormal results are displayed) Labs Reviewed - No data to display   EKG     RADIOLOGY  I personally viewed, evaluated, and interpreted these images as part of my medical decision making, as well as reviewing the written report by the radiologist.  ED Provider Interpretation:   DG Forearm Right  Result Date: 03/20/2023 CLINICAL DATA:  Status post dog bite. EXAM: RIGHT FOREARM - 2 VIEW COMPARISON:  None Available. FINDINGS: There is no evidence of fracture or other focal bone lesions. Soft tissues are unremarkable. IMPRESSION: Negative. Electronically Signed   By: Aram Candela M.D.   On: 03/20/2023 20:47    PROCEDURES:  Critical Care performed: No  Procedures   MEDICATIONS ORDERED IN ED: Medications - No data to display   IMPRESSION / MDM / ASSESSMENT AND PLAN / ED COURSE  I reviewed the triage vital signs and the nursing notes.  Differential diagnosis includes, but is not limited to, dog bite, retained foreign body, vascular injury, skeletal injury from dog bite  Patient's presentation is most consistent with acute presentation with potential threat to life or bodily function.   Patient's diagnosis is consistent with dog bite to the forearm.  Patient presents emergency department with a bite wound to the right forearm.  This occurred from the family members pet.  No concern for rabies and animal is up-to-date on immunizations.  Patient is up-to-date on tetanus.  We did have a discussion regarding rabies and she declines rabies series.  I feel this is reasonable at this time.  Patiently placed on antibiotics prophylactically.  X-ray eval no underlying osseous abnormality or retained foreign body.  Wound care instructions discussed with the patient.  Given the fact that this was a dog bite no attempts at repair were  performed but area was thoroughly cleansed.  Follow-up primary care as needed.  Return precautions discussed with the patient. Patient is given ED precautions to return to the ED for any worsening or new symptoms.     FINAL CLINICAL IMPRESSION(S) / ED DIAGNOSES   Final diagnoses:  Dog bite, initial encounter     Rx / DC Orders   ED Discharge Orders          Ordered    doxycycline (VIBRA-TABS) 100 MG tablet  2 times daily        03/20/23 2139             Note:  This document was prepared using Dragon voice recognition software and may include unintentional dictation errors.   Lanette HampshireCuthriell, [place]is Gentle D, PA-C 03/20/23 2152    Corena HerterMumma, Shannon, MD 03/21/23 [id number]

## 2023-07-16 ENCOUNTER — Encounter: Payer: Self-pay | Admitting: Emergency Medicine

## 2023-07-16 ENCOUNTER — Emergency Department
Admission: EM | Admit: 2023-07-16 | Discharge: 2023-07-16 | Disposition: A | Discharge status: Home / Self Care | Payer: BC Managed Care – PPO | Attending: Student in an Organized Health Care Education/Training Program | Admitting: Student in an Organized Health Care Education/Training Program

## 2023-07-16 ENCOUNTER — Other Ambulatory Visit: Payer: Self-pay

## 2023-07-16 DIAGNOSIS — N939 Abnormal uterine and vaginal bleeding, unspecified: Secondary | ICD-10-CM | POA: Diagnosis present

## 2023-07-16 LAB — BASIC METABOLIC PANEL
Anion gap: 8 (ref 5–15)
BUN: 11 mg/dL (ref 6–20)
CO2: 21 mmol/L — ABNORMAL LOW (ref 22–32)
Calcium: 8.8 mg/dL — ABNORMAL LOW (ref 8.9–10.3)
Chloride: 107 mmol/L (ref 98–111)
Creatinine, Ser: 0.68 mg/dL (ref 0.44–1.00)
GFR, Estimated: >60 mL/min (ref >60)
Glucose, Bld: 132 mg/dL — ABNORMAL HIGH (ref 70–99)
Potassium: 3.8 mmol/L (ref 3.5–5.1)
Sodium: 136 mmol/L (ref 135–145)

## 2023-07-16 LAB — CBC WITH DIFFERENTIAL/PLATELET
Abs Immature Granulocytes: 0.03 10*3/uL (ref 0.00–0.07)
Basophils Absolute: 0 10*3/uL (ref 0.0–0.1)
Basophils Relative: 0 %
Eosinophils Absolute: 0.1 10*3/uL (ref 0.0–0.5)
Eosinophils Relative: 1 %
HCT: 40.4 % (ref 36.0–46.0)
Hemoglobin: 12.5 g/dL (ref 12.0–15.0)
Immature Granulocytes: 0 %
Lymphocytes Relative: 14 %
Lymphs Abs: 1.3 10*3/uL (ref 0.7–4.0)
MCH: 24.3 pg — ABNORMAL LOW (ref 26.0–34.0)
MCHC: 30.9 g/dL (ref 30.0–36.0)
MCV: 78.6 fL — ABNORMAL LOW (ref 80.0–100.0)
Monocytes Absolute: 0.7 10*3/uL (ref 0.1–1.0)
Monocytes Relative: 7 %
Neutro Abs: 7.5 10*3/uL (ref 1.7–7.7)
Neutrophils Relative %: 78 %
Platelets: 348 10*3/uL (ref 150–400)
RBC: 5.14 MIL/uL — ABNORMAL HIGH (ref 3.87–5.11)
RDW: 15.4 % (ref 11.5–15.5)
WBC: 9.6 10*3/uL (ref 4.0–10.5)
nRBC: 0 % (ref 0.0–0.2)

## 2023-07-16 LAB — URINALYSIS, ROUTINE W REFLEX MICROSCOPIC
Bilirubin Urine: NEGATIVE
Glucose, UA: NEGATIVE mg/dL
Ketones, ur: NEGATIVE mg/dL
Nitrite: NEGATIVE
Protein, ur: 30 mg/dL — AB
Specific Gravity, Urine: 1.003 — ABNORMAL LOW (ref 1.005–1.030)
pH: 6 (ref 5.0–8.0)

## 2023-07-16 LAB — HCG, QUANTITATIVE, PREGNANCY: hCG, Beta Chain, Quant, S: 5 m[IU]/mL — ABNORMAL HIGH (ref <5)

## 2023-07-16 LAB — POC URINE PREG, ED: Preg Test, Ur: NEGATIVE

## 2023-07-16 MED ORDER — CEPHALEXIN 500 MG PO CAPS: 500.0000 mg | ORAL_CAPSULE | Freq: Four times a day (QID) | ORAL | 0 refills | Status: AC

## 2023-07-16 NOTE — Discharge Instructions (Signed)
Your urine pregnancy is negative, but your hCG is 5 which could indicate very early pregnancy.  As we discussed, please have this rechecked in 2 days to see if it is going up or down.  Please stay away from things that are dangerous to developing fetus as we discussed.  You may have your tests repeated by OB/GYN.  You are also found to have leukocytes and bacteria in your urine, therefore please take the antibiotics for possible urinary tract infection.  Please return for any new, worsening, or change in symptoms or other concerns.

## 2023-07-16 NOTE — ED Provider Notes (Signed)
[facility] Provider Note    Event Date/Time   First MD Initiated Contact with Patient 07/16/23 0945     (approximate)   History   Vaginal Bleeding   HPI  Brenda Fuentes is a 27 y.o. female G3, P2 who presents today with concern about pregnancy.  She reports that she took a pregnancy test on Thursday that was faintly positive.  She reports that she began to bleed last night.  She reports that the bleeding is described as a light period.  She describes suprapubic cramping as well.  No fevers or chills.  No history of previous miscarriages.  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 09/22/2020   Vitamin D deficiency 10/09/2019   Generalized anxiety disorder 05/02/2019   Major depressive disorder, recurrent episode, moderate (HCC) 05/02/2019   Anxiety and depression 04/23/2019   History of preterm delivery 02/18/2019   History of gestational hypertension 02/18/2019          Physical Exam   Triage Vital Signs: ED Triage Vitals  Encounter Vitals Group     BP 07/16/23 0934 (!) 148/104     Systolic BP Percentile --      Diastolic BP Percentile --      Pulse Rate 07/16/23 0934 87     Resp 07/16/23 0934 18     Temp 07/16/23 0934 98.1 F (36.7 C)     Temp src --      SpO2 07/16/23 0934 99 %     Weight 07/16/23 0935 225 lb (102.1 kg)     Height 07/16/23 0935 5\' 6"  (1.676 m)     Head Circumference --      Peak Flow --      Pain Score 07/16/23 0934 8     Pain Loc --      Pain Education --      Exclude from Growth Chart --     Most recent vital signs: Vitals:   07/16/23 0934  BP: (!) 148/104  Pulse: 87  Resp: 18  Temp: 98.1 F (36.7 C)  SpO2: 99%    Physical Exam Vitals and nursing note reviewed.  Constitutional:      General: Awake and alert. No acute distress.    Appearance: Normal appearance. The patient is obese.  HENT:      Head: Normocephalic and atraumatic.     Mouth: Mucous membranes are moist.  Eyes:     General: PERRL. Normal EOMs        Right eye: No discharge.        Left eye: No discharge.     Conjunctiva/sclera: Conjunctivae normal.  Cardiovascular:     Rate and Rhythm: Normal rate and regular rhythm.     Pulses: Normal pulses.  Pulmonary:     Effort: Pulmonary effort is normal. No respiratory distress.     Breath sounds: Normal breath sounds.  Abdominal:     Abdomen is soft. There is no abdominal tenderness. No rebound or guarding. No distention. Musculoskeletal:        General: No swelling. Normal range of motion.     Cervical back: Normal range of motion and neck supple.  Skin:    General: Skin is warm and dry.     Capillary Refill: Capillary refill takes less than 2 seconds.     Findings: No rash.  Neurological:     Mental Status: The  patient is awake and alert.      ED Results / Procedures / Treatments   Labs (all labs ordered are listed, but only abnormal results are displayed) Labs Reviewed  CBC WITH DIFFERENTIAL/PLATELET - Abnormal; Notable for the following components:      Result Value   RBC 5.14 (*)    MCV 78.6 (*)    MCH 24.3 (*)    All other components within normal limits  BASIC METABOLIC PANEL - Abnormal; Notable for the following components:   CO2 21 (*)    Glucose, Bld 132 (*)    Calcium 8.8 (*)    All other components within normal limits  HCG, QUANTITATIVE, PREGNANCY - Abnormal; Notable for the following components:   hCG, Beta Chain, Quant, S 5 (*)    All other components within normal limits  URINALYSIS, ROUTINE W REFLEX MICROSCOPIC - Abnormal; Notable for the following components:   Color, Urine AMBER (*)    APPearance HAZY (*)    Specific Gravity, Urine 1.003 (*)    Hgb urine dipstick LARGE (*)    Protein, ur 30 (*)    Leukocytes,Ua SMALL (*)    Bacteria, UA FEW (*)    All other components within normal limits  POC URINE PREG, ED      EKG     RADIOLOGY     PROCEDURES:  Critical Care performed:   Procedures   MEDICATIONS ORDERED IN ED: Medications - No data to display   IMPRESSION / MDM / ASSESSMENT AND PLAN / ED COURSE  I reviewed the triage vital signs and the nursing notes.   Differential diagnosis includes, but is not limited to, IUP, ectopic pregnancy, spontaneous abortion, menstrual cycle.  Patient is awake and alert, hemodynamically stable and afebrile.  Urine pregnancy is negative.  Blood work is reassuring, however she has a serum hCG of 5.  It is possible that she is either super early in pregnancy, or has had a miscarriage.  I do not feel that she would benefit from an ultrasound at this time given that she has no abdominal pain or tenderness on exam, and pregnancy would be too early to demonstrate any findings at this point.  I recommended that she have her hCG rechecked in 2 days to see if it is increasing which may signify developing pregnancy, or if it is decreasing which may signify spontaneous abortion.  Her LMP was only 4 weeks and 5 days ago, so she may be very early in pregnancy.  We discussed all of this, and patient understands and agrees.  She also understands return precautions.  She was found to have leukocytes and bacteria in her urine, she was started on antibiotics for this.  Patient understands and agrees with plan.  She was discharged in stable condition with her significant other.  Patient's presentation is most consistent with acute complicated illness / injury requiring diagnostic workup.   FINAL CLINICAL IMPRESSION(S) / ED DIAGNOSES   Final diagnoses:  Vaginal bleeding     Rx / DC Orders   ED Discharge Orders          Ordered    cephALEXin (KEFLEX) 500 MG capsule  4 times daily        07/16/23 1126             Note:  This document was prepared using Dragon voice recognition software and may include unintentional dictation errors.   Keturah Shavers 07/16/23 1415    Willy Eddy,  MD 07/16/23 1540

## 2023-07-16 NOTE — ED Notes (Signed)
Patient states she had two positive pregnancy tests on Friday that had faint lines and a digital one that said pregnant.

## 2023-07-16 NOTE — ED Triage Notes (Signed)
Pt states she thinks she is having a miscarage. Pt states she is 4weeks and 5 days since her last menstrual cycle. Pt states that she took two pregnancy tests this morning and they are negative. Pt states vaginal bleeding started this morning.

## 2023-07-16 NOTE — ED Notes (Signed)
URINE: PINK COLOR NOTICED

## 2023-07-16 NOTE — ED Notes (Signed)
POC PREG DONE IN TRIAGE. RESULTS ARE NEGATIVE. UA SENT TO LAB.

## 2023-07-18 ENCOUNTER — Other Ambulatory Visit: Payer: Self-pay

## 2023-07-18 ENCOUNTER — Other Ambulatory Visit: Payer: BC Managed Care – PPO

## 2023-07-18 DIAGNOSIS — Z3200 Encounter for pregnancy test, result unknown: Secondary | ICD-10-CM

## 2023-07-19 ENCOUNTER — Telehealth: Payer: Self-pay

## 2023-07-19 NOTE — Telephone Encounter (Signed)
Pt left msg on triage asking for a call back regarding test results. Called her back and she had already read Dr. Oretha Milch msg. Pt doesn't know what she wants to do (appt with [place] or follow up later). She mentioned last night she passed a big clot. Has been having some cramping and taking tylenol to help it. Advised if the bleeding becomes super heavy (2 or more full pads in less than one hour) and/or severe pelvic pain, to go to ER.

## 2023-08-08 ENCOUNTER — Other Ambulatory Visit: Payer: Self-pay | Admitting: Podiatry

## 2023-08-08 DIAGNOSIS — S93621A Sprain of tarsometatarsal ligament of right foot, initial encounter: Secondary | ICD-10-CM

## 2023-08-30 ENCOUNTER — Encounter: Payer: Self-pay | Admitting: Podiatry

## 2023-08-31 ENCOUNTER — Emergency Department: Payer: BC Managed Care – PPO

## 2023-08-31 ENCOUNTER — Other Ambulatory Visit: Payer: Self-pay

## 2023-08-31 ENCOUNTER — Emergency Department
Admission: EM | Admit: 2023-08-31 | Discharge: 2023-08-31 | Disposition: A | Discharge status: Home / Self Care | Payer: BC Managed Care – PPO | Attending: Emergency Medicine | Admitting: Emergency Medicine

## 2023-08-31 DIAGNOSIS — G35 Multiple sclerosis: Secondary | ICD-10-CM | POA: Diagnosis not present

## 2023-08-31 DIAGNOSIS — R569 Unspecified convulsions: Secondary | ICD-10-CM | POA: Insufficient documentation

## 2023-08-31 LAB — CBC
HCT: 37.7 % (ref 36.0–46.0)
Hemoglobin: 11.7 g/dL — ABNORMAL LOW (ref 12.0–15.0)
MCH: 24.8 pg — ABNORMAL LOW (ref 26.0–34.0)
MCHC: 31 g/dL (ref 30.0–36.0)
MCV: 80 fL (ref 80.0–100.0)
Platelets: 379 10*3/uL (ref 150–400)
RBC: 4.71 MIL/uL (ref 3.87–5.11)
RDW: 14.1 % (ref 11.5–15.5)
WBC: 8.8 10*3/uL (ref 4.0–10.5)
nRBC: 0 % (ref 0.0–0.2)

## 2023-08-31 LAB — BASIC METABOLIC PANEL
Anion gap: 11 (ref 5–15)
BUN: 14 mg/dL (ref 6–20)
CO2: 23 mmol/L (ref 22–32)
Calcium: 8.9 mg/dL (ref 8.9–10.3)
Chloride: 104 mmol/L (ref 98–111)
Creatinine, Ser: 0.71 mg/dL (ref 0.44–1.00)
GFR, Estimated: >60 mL/min (ref >60)
Glucose, Bld: 95 mg/dL (ref 70–99)
Potassium: 3.5 mmol/L (ref 3.5–5.1)
Sodium: 138 mmol/L (ref 135–145)

## 2023-08-31 LAB — HCG, QUANTITATIVE, PREGNANCY: hCG, Beta Chain, Quant, S: <1 m[IU]/mL (ref <5)

## 2023-08-31 NOTE — ED Provider Notes (Signed)
MRI with no acute abnormalities and no evidence of active demyelination.  Patient is back to baseline per her own report and husband who is at bedside.  Feels ready to go home.  Speech is normal.  Discharge.   Pilar Jarvis, MD 08/31/23 [phone]

## 2023-08-31 NOTE — ED Triage Notes (Addendum)
Pt was at work and had a seizure. Per EMS pt sts that pt has not had a seizure in 6 months. There has been no change to her medication. Pt had another seizure with EMS, EMS gave 2.5mg  of versed. Pt is alert at this time however pt does have expressive aphasia.

## 2023-08-31 NOTE — ED Provider Notes (Signed)
[facility] Provider Note    Event Date/Time   First MD Initiated Contact with Patient 08/31/23 1421     (approximate)   History   Chief Complaint: Seizures   HPI  Brenda Fuentes is a 27 y.o. female with a history of anxiety, multiple sclerosis on Kesimpta and as needed baclofen who comes to the ED due to a seizure.  She reports having seizures in the past but has not had one in 6 months.  She has been compliant with her medications including Lamictal.  No recent illness or trauma, no fever vomiting diarrhea.  Eating normally.  No missed periods but endorses possibility of being pregnant.     Physical Exam   Triage Vital Signs: ED Triage Vitals  Encounter Vitals Group     BP 08/31/23 1418 134/89     Systolic BP Percentile --      Diastolic BP Percentile --      Pulse Rate 08/31/23 1418 91     Resp 08/31/23 1418 16     Temp 08/31/23 1418 99.4 F (37.4 C)     Temp Source 08/31/23 1418 Oral     SpO2 08/31/23 1418 100 %     Weight 08/31/23 1415 237 lb (107.5 kg)     Height --      Head Circumference --      Peak Flow --      Pain Score 08/31/23 1415 0     Pain Loc --      Pain Education --      Exclude from Growth Chart --     Most recent vital signs: Vitals:   08/31/23 1418  BP: 134/89  Pulse: 91  Resp: 16  Temp: 99.4 F (37.4 C)  SpO2: 100%    General: Awake, no distress.  CV:  Good peripheral perfusion.  Regular rate and rhythm Resp:  Normal effort.  Clear to auscultation bilaterally Abd:  No distention.  Soft nontender Other:  PERRL, EOMI, symmetric facial movements.  Expressive aphasia, but understands and responds to questioning with head movements.  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   ED Results / Procedures / Treatments   Labs (all labs ordered are listed, but only abnormal results are displayed) Labs Reviewed  CBC - Abnormal; Notable for the following components:      Result Value    Hemoglobin 11.7 (*)    MCH 24.8 (*)    All other components within normal limits  BASIC METABOLIC PANEL  URINALYSIS, W/ REFLEX TO CULTURE (INFECTION SUSPECTED)  HCG, QUANTITATIVE, PREGNANCY  POC URINE PREG, ED     EKG    RADIOLOGY    PROCEDURES:  Procedures   MEDICATIONS ORDERED IN ED: Medications - No data to display   IMPRESSION / MDM / ASSESSMENT AND PLAN / ED COURSE  I reviewed the triage vital signs and the nursing notes.  DDx: Electrolyte abnormality, AKI, pregnancy, UTI, multiple sclerosis flare  Patient's presentation is most consistent with acute presentation with potential threat to life or bodily function.  Patient presents with seizure, currently awake and alert, understanding language and interacting but not able to speak currently.  Will check labs, pregnancy, MRI brain.  No evidence of meningitis encephalitis intracranial hemorrhage or pseudotumor.       FINAL CLINICAL IMPRESSION(S) / ED DIAGNOSES   Final diagnoses:  Seizure (HCC)  Multiple sclerosis (HCC)     Rx / DC Orders   ED  Discharge Orders     None        Note:  This document was prepared using Dragon voice recognition software and may include unintentional dictation errors.   Sharman Cheek, MD 08/31/23 [phone]

## 2023-09-03 ENCOUNTER — Ambulatory Visit
Admission: RE | Admit: 2023-09-03 | Discharge: 2023-09-03 | Disposition: A | Discharge status: Home / Self Care | Payer: BC Managed Care – PPO | Source: Ambulatory Visit | Attending: Podiatry | Admitting: Podiatry

## 2023-09-03 DIAGNOSIS — S93621A Sprain of tarsometatarsal ligament of right foot, initial encounter: Secondary | ICD-10-CM

## 2023-12-08 DIAGNOSIS — O0993 Supervision of high risk pregnancy, unspecified, third trimester: Secondary | ICD-10-CM | POA: Insufficient documentation

## 2023-12-08 DIAGNOSIS — O0992 Supervision of high risk pregnancy, unspecified, second trimester: Secondary | ICD-10-CM | POA: Insufficient documentation

## 2023-12-12 LAB — OB RESULTS CONSOLE HIV ANTIBODY (ROUTINE TESTING): HIV: NONREACTIVE

## 2023-12-12 LAB — OB RESULTS CONSOLE GC/CHLAMYDIA
Chlamydia: NEGATIVE
Neisseria Gonorrhea: NEGATIVE

## 2023-12-12 LAB — HEPATITIS C ANTIBODY: HCV Ab: NEGATIVE

## 2023-12-12 LAB — OB RESULTS CONSOLE RPR: RPR: NONREACTIVE

## 2023-12-12 LAB — OB RESULTS CONSOLE HEPATITIS B SURFACE ANTIGEN: Hepatitis B Surface Ag: NEGATIVE

## 2023-12-12 LAB — OB RESULTS CONSOLE RUBELLA ANTIBODY, IGM: Rubella: IMMUNE

## 2023-12-12 LAB — OB RESULTS CONSOLE VARICELLA ZOSTER ANTIBODY, IGG: Varicella: IMMUNE

## 2023-12-13 NOTE — L&D Delivery Note (Signed)
 Delivery Note  TARI LECOUNT is a [id number] at [redacted]w[redacted]d, Patient's last menstrual period was 09/12/2023., not consistent with US  at [redacted]w[redacted]d. Estimated Date of Delivery: 06/25/24   First Stage: Labor onset: 0600 Augmentation: None Analgesia Holli intrapartum: N/A SROM at 0430 GBS: positive IP Antibiotics: Ancef  x 1 dose   Second Stage: Complete dilation at 1403 Onset of pushing at 1405 FHR second stage 145 bpm with moderate variability, variable decels with pushing   Shyler presented to L&D with SROM. She was expectantly managed and started to spontaneously labor shortly after SROM. She progressed well to C/C/+2 with a strong urge to push.  She pushed effectively over approximately 10 minutes for a spontaneous vaginal birth.  Delivery of a viable baby girl on 06/13/2024 at 1412 by CNM Delivery of fetal head in OA position with restitution to ROT. Loose nuchal cord x 1, delivered through;  Anterior then posterior shoulders delivered easily with gentle downward traction. Baby placed on mom's chest, and attended to by baby RN Cord double clamped after cessation of pulsation, cut by father of baby  Cord blood sample collection: Not Indicated B POS  Third Stage: Oxytocin  bolus started after delivery of infant for hemorrhage prophylaxis  Placenta delivered via Keren mechanism intact with 3 VC @ 1419 Placenta disposition: discarded Uterine tone firm / bleeding small  Laceration: none Anesthesia for repair: N/A Suture: N/A Est. Blood Loss (mL): 100 ml   Complications: None  Mom to postpartum.  Baby to Couplet care / Skin to Skin.  Newborn: Information for the patient's newborn:  Damica, Gravlin [[id number]]  Live born female  Birth Weight: 5 lb 13.8 oz (2660 g) APGAR: 9, 9  Newborn Delivery   Birth date/time: 06/13/2024 14:12:00 Delivery type: Vaginal, Spontaneous      Feeding planned: breast feeding  ---------- Therisa Pillow, CNM Certified Nurse Midwife [facility]

## 2023-12-19 ENCOUNTER — Other Ambulatory Visit: Payer: Self-pay | Admitting: Obstetrics and Gynecology

## 2023-12-19 DIAGNOSIS — Z363 Encounter for antenatal screening for malformations: Secondary | ICD-10-CM

## 2023-12-19 DIAGNOSIS — Z8759 Personal history of other complications of pregnancy, childbirth and the puerperium: Secondary | ICD-10-CM

## 2023-12-19 DIAGNOSIS — G35 Multiple sclerosis: Secondary | ICD-10-CM

## 2023-12-21 ENCOUNTER — Telehealth: Payer: Self-pay | Admitting: Family Medicine

## 2023-12-21 NOTE — Telephone Encounter (Signed)
 I reached out to the patient to get her scheduled for prenatal from the referral I received. No answer, message left and a mychart message.

## 2024-01-17 ENCOUNTER — Other Ambulatory Visit: Payer: Self-pay

## 2024-01-17 ENCOUNTER — Emergency Department
Admission: EM | Admit: 2024-01-17 | Discharge: 2024-01-17 | Disposition: A | Discharge status: Home / Self Care | Payer: 59 | Attending: Emergency Medicine | Admitting: Emergency Medicine

## 2024-01-17 DIAGNOSIS — Z3A16 16 weeks gestation of pregnancy: Secondary | ICD-10-CM

## 2024-01-17 DIAGNOSIS — O99352 Diseases of the nervous system complicating pregnancy, second trimester: Secondary | ICD-10-CM | POA: Insufficient documentation

## 2024-01-17 DIAGNOSIS — Z87898 Personal history of other specified conditions: Secondary | ICD-10-CM

## 2024-01-17 DIAGNOSIS — R569 Unspecified convulsions: Secondary | ICD-10-CM | POA: Insufficient documentation

## 2024-01-17 LAB — CBC WITH DIFFERENTIAL/PLATELET
Abs Immature Granulocytes: 0.04 10*3/uL (ref 0.00–0.07)
Basophils Absolute: 0 10*3/uL (ref 0.0–0.1)
Basophils Relative: 0 %
Eosinophils Absolute: 0.1 10*3/uL (ref 0.0–0.5)
Eosinophils Relative: 1 %
HCT: 35.6 % — ABNORMAL LOW (ref 36.0–46.0)
Hemoglobin: 11.6 g/dL — ABNORMAL LOW (ref 12.0–15.0)
Immature Granulocytes: 0 %
Lymphocytes Relative: 11 %
Lymphs Abs: 1.4 10*3/uL (ref 0.7–4.0)
MCH: 25.8 pg — ABNORMAL LOW (ref 26.0–34.0)
MCHC: 32.6 g/dL (ref 30.0–36.0)
MCV: 79.3 fL — ABNORMAL LOW (ref 80.0–100.0)
Monocytes Absolute: 0.7 10*3/uL (ref 0.1–1.0)
Monocytes Relative: 6 %
Neutro Abs: 10.1 10*3/uL — ABNORMAL HIGH (ref 1.7–7.7)
Neutrophils Relative %: 82 %
Platelets: 279 10*3/uL (ref 150–400)
RBC: 4.49 MIL/uL (ref 3.87–5.11)
RDW: 14 % (ref 11.5–15.5)
WBC: 12.4 10*3/uL — ABNORMAL HIGH (ref 4.0–10.5)
nRBC: 0 % (ref 0.0–0.2)

## 2024-01-17 LAB — COMPREHENSIVE METABOLIC PANEL
ALT: 15 U/L (ref 0–44)
AST: 18 U/L (ref 15–41)
Albumin: 3.2 g/dL — ABNORMAL LOW (ref 3.5–5.0)
Alkaline Phosphatase: 60 U/L (ref 38–126)
Anion gap: 8 (ref 5–15)
BUN: 5 mg/dL — ABNORMAL LOW (ref 6–20)
CO2: 21 mmol/L — ABNORMAL LOW (ref 22–32)
Calcium: 8.7 mg/dL — ABNORMAL LOW (ref 8.9–10.3)
Chloride: 108 mmol/L (ref 98–111)
Creatinine, Ser: 0.5 mg/dL (ref 0.44–1.00)
GFR, Estimated: >60 mL/min (ref >60)
Glucose, Bld: 116 mg/dL — ABNORMAL HIGH (ref 70–99)
Potassium: 3.1 mmol/L — ABNORMAL LOW (ref 3.5–5.1)
Sodium: 137 mmol/L (ref 135–145)
Total Bilirubin: <0.2 mg/dL (ref 0.0–1.2)
Total Protein: 7.1 g/dL (ref 6.5–8.1)

## 2024-01-17 LAB — CBG MONITORING, ED: Glucose-Capillary: 113 mg/dL — ABNORMAL HIGH (ref 70–99)

## 2024-01-17 LAB — HCG, QUANTITATIVE, PREGNANCY: hCG, Beta Chain, Quant, S: 4397 m[IU]/mL — ABNORMAL HIGH (ref <5)

## 2024-01-17 MED ORDER — POTASSIUM CHLORIDE CRYS ER 20 MEQ PO TBCR
40.0000 meq | EXTENDED_RELEASE_TABLET | Freq: Once | ORAL | Status: AC
  Administered 2024-01-17: 40 meq via ORAL
  Filled 2024-01-17: qty 2

## 2024-01-17 NOTE — ED Provider Notes (Signed)
 [facility] Provider Note    Event Date/Time   First MD Initiated Contact with Patient 01/17/24 1502     (approximate)   History   Chief Complaint Seizures   HPI  Brenda Fuentes is a 28 y.o. female currently [redacted] weeks pregnant with past medical history of multiple sclerosis and seizures who presents to the ED complaining of seizure.  Patient reports that just prior to arrival she was at work started to feel something was off, like I was going to have a seizure.  Patient then describes an episode lasting for about 2 minutes where she had fluttering of her eyes and shaking of her head and neck.  She denies any shaking in her extremities and did not have any stiffness.  She is unsure whether she lost consciousness, states that she could hear what was going on around her throughout the time this was happening.  She describes seizures today as similar to prior episodes, with her last episode happening in September.  She previously did not take any medications for seizures, was told that the episodes were nonepileptic.  She follows with a neurologist for MS, was recently restarted on the [place] by her psychiatrist 4 days ago for mood stabilization.     Physical Exam   Triage Vital Signs: ED Triage Vitals [01/17/24 1425]  Encounter Vitals Group     BP (!) 164/92     Systolic BP Percentile      Diastolic BP Percentile      Pulse Rate 100     Resp 18     Temp 98.7 F (37.1 C)     Temp Source Oral     SpO2 97 %     Weight 218 lb (98.9 kg)     Height 5' 6 (1.676 m)     Head Circumference      Peak Flow      Pain Score 7     Pain Loc      Pain Education      Exclude from Growth Chart     Most recent vital signs: Vitals:   01/17/24 1425  BP: (!) 164/92  Pulse: 100  Resp: 18  Temp: 98.7 F (37.1 C)  SpO2: 97%    Constitutional: Alert and oriented. Eyes: Conjunctivae are normal. Head: Atraumatic. Nose: No  congestion/rhinnorhea. Mouth/Throat: Mucous membranes are moist.  Cardiovascular: Normal rate, regular rhythm. Grossly normal heart sounds.  2+ radial pulses bilaterally. Respiratory: Normal respiratory effort.  No retractions. Lungs CTAB. Gastrointestinal: Gravid abdomen, soft and nontender. No distention. Musculoskeletal: No lower extremity tenderness nor edema.  Neurologic:  Normal speech and language. No gross focal neurologic deficits are appreciated.    ED Results / Procedures / Treatments   Labs (all labs ordered are listed, but only abnormal results are displayed) Labs Reviewed  COMPREHENSIVE METABOLIC PANEL - Abnormal; Notable for the following components:      Result Value   Potassium 3.1 (*)    CO2 21 (*)    Glucose, Bld 116 (*)    BUN 5 (*)    Calcium  8.7 (*)    Albumin 3.2 (*)    All other components within normal limits  CBC WITH DIFFERENTIAL/PLATELET - Abnormal; Notable for the following components:   WBC 12.4 (*)    Hemoglobin 11.6 (*)    HCT 35.6 (*)    MCV 79.3 (*)    MCH 25.8 (*)    Neutro Abs 10.1 (*)  All other components within normal limits  HCG, QUANTITATIVE, PREGNANCY - Abnormal; Notable for the following components:   hCG, Beta Chain, Quant, S 4,397 (*)    All other components within normal limits  CBG MONITORING, ED - Abnormal; Notable for the following components:   Glucose-Capillary 113 (*)    All other components within normal limits  LAMOTRIGINE  LEVEL     EKG  ED ECG REPORT I, Carlin Palin, the attending physician, personally viewed and interpreted this ECG.   Date: 01/17/2024  EKG Time: 14:35  Rate: 99  Rhythm: normal sinus rhythm  Axis: Normal  Intervals:none  ST&T Change: Inferior T wave inversions  PROCEDURES:  Critical Care performed: No  Ultrasound ED OB Pelvic  Date/Time: 01/17/2024 4:52 PM  Performed by: Palin Carlin, MD Authorized by: Palin Carlin, MD   Procedure details:    Indications: evaluate for IUP      Assess:  Intrauterine pregnancy   Technique:  Transabdominal obstetric (HCG+) exam   Images: not archived    Uterine findings:    Fetal heart rate: identified (HR 146)     Estimated gestational age: 42 weeks    MEDICATIONS ORDERED IN ED: Medications  potassium chloride  SA (KLOR-CON  M) CR tablet 40 mEq (40 mEq Oral Given 01/17/24 1629)     IMPRESSION / MDM / ASSESSMENT AND PLAN / ED COURSE  I reviewed the triage vital signs and the nursing notes.                              28 y.o. female at approximately 17 weeks of pregnancy who presents to the ED with seizure-like episode where she had fluttering of her eyes as well as shaking of her head and neck with possible loss of consciousness.  Patient's presentation is most consistent with acute presentation with potential threat to life or bodily function.  Differential diagnosis includes, but is not limited to, seizure, psychogenic nonepileptic seizure, eclampsia, medication noncompliance, anemia, electrolyte abnormality, AKI.  Patient nontoxic-appearing and in no acute distress, vital signs remarkable for mild hypertension but otherwise reassuring.  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  She had MRI of her brain without contrast in September following similar presentation, now that episode has passed she denies any ongoing complaints and has a nonfocal neurologic exam.  Do not feel repeat imaging is warranted, case discussed with Dr. Jerrie of neurology, who is in agreement.  She recommends checking lamotrigine  level as this may be affected by pregnancy, patient may follow-up with neurology and psychiatry.  Labs with mild hypokalemia but no significant anemia, leukocytosis, or AKI.  Bedside ultrasound with fetal  movement noted with appropriate fetal heart rate.  Patient states that episode occurred while she was sitting in a chair, no trauma associated with this.  She is appropriate for discharge home with outpatient follow-up, was counseled to return to the ED for new or worsening symptoms.  Patient agrees with plan.      FINAL CLINICAL IMPRESSION(S) / ED DIAGNOSES   Final diagnoses:  History of psychogenic nonepileptic seizure  Seizure-like activity (HCC)  [redacted] weeks gestation of pregnancy     Rx / DC Orders   ED Discharge Orders     None  Note:  This document was prepared using Dragon voice recognition software and may include unintentional dictation errors.   Willo Dunnings, MD 01/17/24 [phone]

## 2024-01-17 NOTE — ED Notes (Signed)
 First nure note: Pt here from via EMS from school, pt has seizure at school,, pt has HX of same. EMS states more focal seizure. EMS states new DX in Sept. Pt currently A&Ox4. Pt took herself off Lamictal  by her choice and recently put herself.   123/62 HR: 80 98%  CBG 136

## 2024-01-17 NOTE — ED Triage Notes (Addendum)
 Pt reports she had a seizure today at work around 1320. She states she felt it coming on. She takes Lamictal , states she last took it last night. She has been off of it for 6 months, last seizure was September 2024. She states they are not electrical seizures. Upon calling her name for triage and wheeling her into triage room 3, she began shaking her head and arms, seizure like activity but she did respond immediatly yo sternal rub and was not post ictal immediately after. She is currently tearful and A&Ox4.  Pt reports she is [redacted] weeks pregnant. Due date 06-25-2024, she is seen at [facility].

## 2024-01-17 NOTE — ED Provider Triage Note (Signed)
 Emergency Medicine Provider Triage Evaluation Note  Brenda Fuentes , a 28 y.o. female  was evaluated in triage.  Pt complains of seizure today at work, has history of them takes lamictal . Stopped taking it for 6 months and recently restarted the medication.  Review of Systems  Positive: Neck pain, head fuzziness Negative:   Physical Exam  BP (!) 164/92 (BP Location: Left Arm)   Pulse 100   Temp 98.7 F (37.1 C) (Oral)   Resp 18   Ht 5' 6 (1.676 m)   Wt 98.9 kg   LMP  (Approximate)   SpO2 97%   BMI 35.19 kg/m  Gen:   Awake, no distress   Resp:  Normal effort  MSK:   Moves extremities without difficulty  Other:    Medical Decision Making  Medically screening exam initiated at 2:36 PM.  Appropriate orders placed.  Brenda Fuentes was informed that the remainder of the evaluation will be completed by another provider, this initial triage assessment does not replace that evaluation, and the importance of remaining in the ED until their evaluation is complete.     Cleaster Tinnie DELENA, PA-C 01/17/24 1440

## 2024-01-18 LAB — LAMOTRIGINE LEVEL: Lamotrigine Lvl: <1 ug/mL — ABNORMAL LOW (ref 2.0–20.0)

## 2024-01-31 ENCOUNTER — Ambulatory Visit: Payer: 59 | Attending: Obstetrics and Gynecology

## 2024-03-04 ENCOUNTER — Observation Stay

## 2024-03-04 ENCOUNTER — Observation Stay
Admission: EM | Admit: 2024-03-04 | Discharge: 2024-03-05 | Disposition: A | Discharge status: Home / Self Care | Attending: Obstetrics and Gynecology | Admitting: Obstetrics and Gynecology

## 2024-03-04 ENCOUNTER — Other Ambulatory Visit: Payer: Self-pay

## 2024-03-04 DIAGNOSIS — Z7901 Long term (current) use of anticoagulants: Secondary | ICD-10-CM | POA: Insufficient documentation

## 2024-03-04 DIAGNOSIS — O26892 Other specified pregnancy related conditions, second trimester: Principal | ICD-10-CM | POA: Insufficient documentation

## 2024-03-04 DIAGNOSIS — R031 Nonspecific low blood-pressure reading: Secondary | ICD-10-CM | POA: Diagnosis not present

## 2024-03-04 DIAGNOSIS — R42 Dizziness and giddiness: Secondary | ICD-10-CM | POA: Insufficient documentation

## 2024-03-04 DIAGNOSIS — R0602 Shortness of breath: Secondary | ICD-10-CM | POA: Diagnosis not present

## 2024-03-04 DIAGNOSIS — Z87891 Personal history of nicotine dependence: Secondary | ICD-10-CM | POA: Insufficient documentation

## 2024-03-04 DIAGNOSIS — Z3A23 23 weeks gestation of pregnancy: Secondary | ICD-10-CM | POA: Insufficient documentation

## 2024-03-04 DIAGNOSIS — Z1152 Encounter for screening for COVID-19: Secondary | ICD-10-CM | POA: Diagnosis not present

## 2024-03-04 DIAGNOSIS — O26899 Other specified pregnancy related conditions, unspecified trimester: Principal | ICD-10-CM | POA: Diagnosis present

## 2024-03-04 LAB — COMPREHENSIVE METABOLIC PANEL
ALT: 14 U/L (ref 0–44)
AST: 12 U/L — ABNORMAL LOW (ref 15–41)
Albumin: 2.9 g/dL — ABNORMAL LOW (ref 3.5–5.0)
Alkaline Phosphatase: 59 U/L (ref 38–126)
Anion gap: 7 (ref 5–15)
BUN: 8 mg/dL (ref 6–20)
CO2: 20 mmol/L — ABNORMAL LOW (ref 22–32)
Calcium: 8.5 mg/dL — ABNORMAL LOW (ref 8.9–10.3)
Chloride: 110 mmol/L (ref 98–111)
Creatinine, Ser: 0.4 mg/dL — ABNORMAL LOW (ref 0.44–1.00)
GFR, Estimated: >60 mL/min (ref >60)
Glucose, Bld: 90 mg/dL (ref 70–99)
Potassium: 3.5 mmol/L (ref 3.5–5.1)
Sodium: 137 mmol/L (ref 135–145)
Total Bilirubin: 0.4 mg/dL (ref 0.0–1.2)
Total Protein: 6.6 g/dL (ref 6.5–8.1)

## 2024-03-04 LAB — CBC
HCT: 32.1 % — ABNORMAL LOW (ref 36.0–46.0)
Hemoglobin: 10.5 g/dL — ABNORMAL LOW (ref 12.0–15.0)
MCH: 26.2 pg (ref 26.0–34.0)
MCHC: 32.7 g/dL (ref 30.0–36.0)
MCV: 80 fL (ref 80.0–100.0)
Platelets: 288 10*3/uL (ref 150–400)
RBC: 4.01 MIL/uL (ref 3.87–5.11)
RDW: 14.7 % (ref 11.5–15.5)
WBC: 12 10*3/uL — ABNORMAL HIGH (ref 4.0–10.5)
nRBC: 0 % (ref 0.0–0.2)

## 2024-03-04 LAB — URINALYSIS, ROUTINE W REFLEX MICROSCOPIC
Bilirubin Urine: NEGATIVE
Glucose, UA: NEGATIVE mg/dL
Hgb urine dipstick: NEGATIVE
Ketones, ur: NEGATIVE mg/dL
Nitrite: NEGATIVE
Protein, ur: NEGATIVE mg/dL
Specific Gravity, Urine: 1.003 — ABNORMAL LOW (ref 1.005–1.030)
pH: 8 (ref 5.0–8.0)

## 2024-03-04 LAB — BRAIN NATRIURETIC PEPTIDE: B Natriuretic Peptide: 17.3 pg/mL (ref 0.0–100.0)

## 2024-03-04 LAB — RESP PANEL BY RT-PCR (RSV, FLU A&B, COVID)  RVPGX2
Influenza A by PCR: NEGATIVE
Influenza B by PCR: NEGATIVE
Resp Syncytial Virus by PCR: NEGATIVE
SARS Coronavirus 2 by RT PCR: NEGATIVE

## 2024-03-04 LAB — D-DIMER, QUANTITATIVE: D-Dimer, Quant: 0.33 ug{FEU}/mL (ref 0.00–0.50)

## 2024-03-04 LAB — GLUCOSE, CAPILLARY: Glucose-Capillary: 81 mg/dL (ref 70–99)

## 2024-03-04 LAB — TROPONIN I (HIGH SENSITIVITY)
Troponin I (High Sensitivity): 2 ng/L (ref <18)
Troponin I (High Sensitivity): 3 ng/L (ref <18)

## 2024-03-04 LAB — PROTEIN / CREATININE RATIO, URINE
Creatinine, Urine: 15 mg/dL
Total Protein, Urine: <6 mg/dL

## 2024-03-04 MED ORDER — MECLIZINE HCL 25 MG PO TABS
25.0000 mg | ORAL_TABLET | Freq: Once | ORAL | Status: AC
  Administered 2024-03-04: 25 mg via ORAL
  Filled 2024-03-04: qty 1

## 2024-03-04 MED ORDER — LORAZEPAM 1 MG PO TABS: 1.0000 mg | ORAL_TABLET | Freq: Once | ORAL | Status: DC

## 2024-03-04 MED ORDER — CALCIUM CARBONATE ANTACID 500 MG PO CHEW: 2.0000 | CHEWABLE_TABLET | ORAL | Status: DC | PRN

## 2024-03-04 MED ORDER — LORATADINE 10 MG PO TABS
10.0000 mg | ORAL_TABLET | Freq: Every day | ORAL | Status: DC
  Administered 2024-03-04: 10 mg via ORAL
  Filled 2024-03-04: qty 1

## 2024-03-04 MED ORDER — ACETAMINOPHEN 325 MG PO TABS: 650.0000 mg | ORAL_TABLET | ORAL | Status: DC | PRN

## 2024-03-04 NOTE — H&P (Cosign Needed Addendum)
 Brenda Fuentes is a 28 y.o. female. She is at [redacted]w[redacted]d gestation. No LMP recorded (lmp unknown). Patient is pregnant. Estimated Date of Delivery: 06/25/24  Prenatal care site: [facility]   Current pregnancy complicated by:  - h/o migraines - h/o nonepileptic seizures - h/o anxiety and depression - h/o gHTN - h/o multiple sclerosus - obesity - h/o 36wk preterm delivery - former smoker  Chief complaint: dizziness and shortness of breath  She reports dizziness that has worsened over the last few days, accompanied with shortness of breath. She has a hx of migraines and MS and nonepileptic seizures from stress but denies feeling like these symptoms are related to those problems. She reports if she stands she feels dizzy and has to sit down. Sometimes she experiences shortness of breath and like her heart is beating fast. She denies feeling febrile, denies chest pain, denies loss of consciousness, denies chest pain, denies asthma symptoms. 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 She reports good fetal movement and denies vaginal bleeding, contractions, or leaking fluid.   S: Resting comfortably. no CTX, no VB.no LOF,  Active fetal movement.  Denies: HA, visual changes, SOB, or RUQ/epigastric pain  Maternal Medical History:   Past Medical History:  Diagnosis Date   Anxiety    Depression 2016   Gestational hypertension    Hypercholesteremia 2009    Past Surgical History:  Procedure Laterality Date   NO PAST SURGERIES      Allergies  Allergen Reactions   Penicillins Hives    Prior to Admission medications   Medication Sig Start Date End Date Taking? Authorizing Provider  aspirin 81 MG chewable tablet Chew 81 mg by mouth daily.   Yes [provider]  lamoTRIgine (LAMICTAL) 25 MG tablet Take 50 mg by mouth daily.   Yes [provider]  Prenatal Vit-Fe Fumarate-FA (PRENATAL MULTIVITAMIN) TABS tablet Take  1 tablet by mouth daily at 12 noon.   Yes [provider]  doxycycline (VIBRA-TABS) 100 MG tablet Take 1 tablet (100 mg total) by mouth 2 (two) times daily. Patient not taking: Reported on 03/04/2024 03/20/23   Cuthriell, Delorise Royals, PA-C  norethindrone (MICRONOR) 0.35 MG tablet Take 1 tablet (0.35 mg total) by mouth daily. Patient not taking: Reported on 03/04/2024 01/27/23   Doreene Burke, CNM  rizatriptan (MAXALT) 10 MG tablet Take by mouth as needed.  Patient not taking: Reported on 03/04/2024 10/11/20   [provider]    Social History: She  reports that she quit smoking about 5 years ago. Her smoking use included cigarettes. She has never used smokeless tobacco. She reports that she does not drink alcohol and does not use drugs.  Family History: family history includes Diabetes in her father; Heart failure in her father; Hyperlipidemia in her mother.  no history of gyn cancers  Review of Systems: A full review of systems was performed and negative except as noted in the HPI.     O:  BP (!) 147/85 (BP Location: Left Arm)   Pulse (!) 109   Temp 98.8 F (37.1 C) (Oral)   Resp 16   Ht 5\' 5"  (1.651 m)   Wt 98.9 kg   LMP  (LMP Unknown)   SpO2 97%   BMI 36.28 kg/m  Vitals:   03/04/24 1525 03/04/24 1530  BP:    Pulse:    Resp:    Temp:    SpO2: 97%  97%    Results for orders placed or performed during the hospital encounter of 03/04/24 (from the past 48 hours)  Glucose, capillary   Collection Time: 03/04/24  9:20 AM  Result Value Ref Range   Glucose-Capillary 81 70 - 99 mg/dL  Urinalysis, Routine w reflex microscopic -Urine, Clean Catch   Collection Time: 03/04/24  9:22 AM  Result Value Ref Range   Color, Urine STRAW (A) YELLOW   APPearance HAZY (A) CLEAR   Specific Gravity, Urine 1.003 (L) 1.005 - 1.030   pH 8.0 5.0 - 8.0   Glucose, UA NEGATIVE NEGATIVE mg/dL   Hgb urine dipstick NEGATIVE NEGATIVE   Bilirubin Urine NEGATIVE NEGATIVE   Ketones, ur  NEGATIVE NEGATIVE mg/dL   Protein, ur NEGATIVE NEGATIVE mg/dL   Nitrite NEGATIVE NEGATIVE   Leukocytes,Ua SMALL (A) NEGATIVE   RBC / HPF 0-5 0 - 5 RBC/hpf   WBC, UA 0-5 0 - 5 WBC/hpf   Bacteria, UA RARE (A) NONE SEEN   Squamous Epithelial / HPF 6-10 0 - 5 /HPF  CBC   Collection Time: 03/04/24  9:22 AM  Result Value Ref Range   WBC 12.0 (H) 4.0 - 10.5 K/uL   RBC 4.01 3.87 - 5.11 MIL/uL   Hemoglobin 10.5 (L) 12.0 - 15.0 g/dL   HCT 16.1 (L) 09.6 - 04.5 %   MCV 80.0 80.0 - 100.0 fL   MCH 26.2 26.0 - 34.0 pg   MCHC 32.7 30.0 - 36.0 g/dL   RDW 40.9 81.1 - 91.4 %   Platelets 288 150 - 400 K/uL   nRBC 0.0 0.0 - 0.2 %  Comprehensive metabolic panel   Collection Time: 03/04/24  9:22 AM  Result Value Ref Range   Sodium 137 135 - 145 mmol/L   Potassium 3.5 3.5 - 5.1 mmol/L   Chloride 110 98 - 111 mmol/L   CO2 20 (L) 22 - 32 mmol/L   Glucose, Bld 90 70 - 99 mg/dL   BUN 8 6 - 20 mg/dL   Creatinine, Ser 7.82 (L) 0.44 - 1.00 mg/dL   Calcium 8.5 (L) 8.9 - 10.3 mg/dL   Total Protein 6.6 6.5 - 8.1 g/dL   Albumin 2.9 (L) 3.5 - 5.0 g/dL   AST 12 (L) 15 - 41 U/L   ALT 14 0 - 44 U/L   Alkaline Phosphatase 59 38 - 126 U/L   Total Bilirubin 0.4 0.0 - 1.2 mg/dL   GFR, Estimated >95 >62 mL/min   Anion gap 7 5 - 15  Protein / creatinine ratio, urine   Collection Time: 03/04/24  9:22 AM  Result Value Ref Range   Creatinine, Urine 15 mg/dL   Total Protein, Urine <6 mg/dL   Protein Creatinine Ratio        0.00 - 0.15 mg/mg[Cre]  Brain natriuretic peptide   Collection Time: 03/04/24 12:43 PM  Result Value Ref Range   B Natriuretic Peptide 17.3 0.0 - 100.0 pg/mL  Troponin I (High Sensitivity)   Collection Time: 03/04/24 12:43 PM  Result Value Ref Range   Troponin I (High Sensitivity) 2 <18 ng/L  Troponin I (High Sensitivity)   Collection Time: 03/04/24  2:49 PM  Result Value Ref Range   Troponin I (High Sensitivity) 3 <18 ng/L  Resp panel by RT-PCR (RSV, Flu A&B, Covid) Anterior Nasal Swab    Collection Time: 03/04/24  3:07 PM   Specimen: Anterior Nasal Swab  Result Value Ref Range   SARS Coronavirus 2 by RT PCR  NEGATIVE NEGATIVE   Influenza A by PCR NEGATIVE NEGATIVE   Influenza B by PCR NEGATIVE NEGATIVE   Resp Syncytial Virus by PCR NEGATIVE NEGATIVE     Constitutional: NAD, AAOx3  HE/ENT: extraocular movements grossly intact, moist mucous membranes CV: RRR PULM: nl respiratory effort, CTABL     Abd: gravid, non-tender, non-distended, soft      Ext: Non-tender, Nonedematous   Psych: mood appropriate, speech normal Pelvic: deferred  Fetal  monitoring: Cat 1 Appropriate for GA Baseline: 150bpm Variability: moderate Accelerations: present x >2 10x10 accelerations Decelerations absent Time  A/P: 28 y.o. [redacted]w[redacted]d here for antenatal surveillance for dizziness and shortness of breath  Principle Diagnosis: Dizziness in pregnancy, shortness of breath in pregnancy  SOB: lungs clear to auscultation, easy work of breathing, RA SpO2 95-98%, BNP WNL, troponins WNL, chest x-ray WNL, EKG NSR. D-dimer ordered and pending. Continuous pulse oximetry, cycling BP q44min.  Dizziness: CBC and CMP WNL, glucose WNL, flu/covid negative, chest x-ray WNL, EKG NSR. Orthostatic vital signs show HR fluctuations with position changes, she is orally hydrating. Patient's symptoms have not improved. Elevated BP: no hx of hypertension this pregnancy. PIH labs WNL.  Fetal Wellbeing: Reactive NST  Discussed with Dr. Dalbert Garnet and requested she come evaluate patient   Janyce Llanos, CNM 03/04/2024 4:45 PM

## 2024-03-04 NOTE — OB Triage Note (Signed)
 Patient is a 28 yo, G3P1102, at 23 weeks 6 days. Patient presents with complaints of dizziness and shortness of breath.  Patient denies any vaginal bleeding or LOF. Patient reports FM+. Patient denies any regular or consistent contractions. Monitors applied and assessing. VSS. Initial fetal heart tone 155. Wilson CNM notified of patients arrival to unit. Plan to place in observation for fetal monitoring, CBG, CMP, and CBC.

## 2024-03-04 NOTE — Progress Notes (Signed)
Pt transported to US via transporter.

## 2024-03-05 ENCOUNTER — Observation Stay
Admit: 2024-03-05 | Discharge: 2024-03-05 | Disposition: A | Discharge status: Home / Self Care | Attending: Cardiology | Admitting: Cardiology

## 2024-03-05 DIAGNOSIS — O26892 Other specified pregnancy related conditions, second trimester: Secondary | ICD-10-CM | POA: Diagnosis not present

## 2024-03-05 LAB — ECHOCARDIOGRAM COMPLETE
AR max vel: 2.13 cm2
AV Area VTI: 2.55 cm2
AV Area mean vel: 2.28 cm2
AV Mean grad: 4 mmHg
AV Peak grad: 7.2 mmHg
Ao pk vel: 1.34 m/s
Area-P 1/2: 5.23 cm2
Height: 65 in
S' Lateral: 2.9 cm
Weight: 3488 [oz_av]

## 2024-03-05 MED ORDER — METOPROLOL TARTRATE 25 MG PO TABS: 25.0000 mg | ORAL_TABLET | Freq: Two times a day (BID) | ORAL | 1 refills | Status: AC

## 2024-03-05 NOTE — Progress Notes (Signed)
 Patient ID: Brenda Fuentes, female   DOB: Apr 21, 1996, 28 y.o.   MRN: [id number] To patients bedside  28yo [id number] here with dizziness and SOB, worsening with sitting and standing and improved with lying flat. Sx worsening over last three days. Has had to stop and sit or hold on to keep from falling over. Feels her "vision closes in." No pregnancy concerns.  Current pregnancy complicated by:  - h/o migraines - h/o nonepileptic seizures - h/o multiple sclerosus, no meds - obesity - h/o 36wk preterm delivery - h/o anxiety and depression - h/o gHTN - former smoker  She reports seeing cardiology for similar sx prior to pregnancy but no dx made. She has also seen neurology in the past. She has been on lamictal prior to pregnancy for moods and headaches.  All labs and imaging wnl. Small variance: WBC slightly elevated at 12.0, but afebrile. Hgb low at 10.5 but is consistent with outpatient labs in pregnancy. Troponins and BMP wnl. D-dimer wnl 0.33. UA clear and  well-hydrated. Chest xray negative. EKG appear negative.  BP elevated on orthostatic vitals but otherwise neg. PreE labs all reassuring. P:C to low to calculate.   Fetal status appropriate on external FHT with baseline 150s, mod variability and +accels 10x10.   Vitals:   03/04/24 1929 03/05/24 0730  BP: 128/71 (!) 140/75  Pulse: (!) 101 93  Resp: 14 16  Temp: 98 F (36.7 C) 98.3 F (36.8 C)  SpO2:     On exam, pt with clear lungs, regular rhythm and non tender abdomen. Fundus not tender. No RUQ pain. Normal LE reflexes.  DDx: POTS.  - Have spoken with neurology and cardiology re: consult. Plan for assessment today.  - continue monitoring - regular diet - PO hydration appropriate.

## 2024-03-05 NOTE — Progress Notes (Signed)
 Resting without distress. Reg diet given and tolerated. Will call for problems or concerns.

## 2024-03-05 NOTE — Progress Notes (Signed)
 Echocardiogram being performed at bedside.

## 2024-03-05 NOTE — Progress Notes (Signed)
 Discharge instructions provided to patient. Patient verbalized understanding. Pt educated on signs and symptoms of labor, vaginal bleeding, LOF, and fetal movement. Red flag signs reviewed by RN. Patient discharged home in stable condition.

## 2024-03-05 NOTE — Consult Note (Signed)
 [facility] Cardiology  CARDIOLOGY CONSULT NOTE  Patient ID: Brenda Fuentes MRN: [id number] DOB/AGE: 08-16-1996 28 y.o.  Admit date: 03/04/2024 Referring Physician Dr. Dalbert Garnet Primary Cardiologist [facility] cardiology Reason for Consultation palpitations, dizziness and some shortness of breath  HPI: 28 year old female who is about [redacted] weeks pregnant on home our service is consulted for palpitation, dizziness and some shortness of breath.  Patient detailed that in last couple weeks she has symptoms of palpitations along with dizziness when she stands up or do activities.  Also has intermittent shortness of breath.  No chest pain/pressure.  As symptoms are getting worse, she is currently admitted in Obs by her OBG team.  She has similar but less pronounced symptoms couple years ago when she wore a monitor with average heart rate of 84 bpm, sinus rhythm, symptoms correlated with sinus tachycardia and no significant arrhythmias.  Treated with beta-blockers with good response and later she stopped it.  EKG on admission showed sinus rhythm with heart rate of 91 bpm.  Labs including normal troponin x 2, normal BNP normal D-dimer.  Currently patient resting comfortably in bed with no complaints.  Orthostatics showed no drop in blood pressure and increase in heart rate by 30 beats  Review of systems complete and found to be negative unless listed above     Past Medical History:  Diagnosis Date   Anxiety    Depression 2016   Gestational hypertension    Hypercholesteremia 2009    Past Surgical History:  Procedure Laterality Date   NO PAST SURGERIES      Medications Prior to Admission  Medication Sig Dispense Refill Last Dose/Taking   aspirin 81 MG chewable tablet Chew 81 mg by mouth daily.   03/03/2024   lamoTRIgine (LAMICTAL) 25 MG tablet Take 50 mg by mouth daily.   03/03/2024   Prenatal Vit-Fe Fumarate-FA (PRENATAL MULTIVITAMIN) TABS tablet Take 1 tablet by mouth daily at 12 noon.   03/03/2024    doxycycline (VIBRA-TABS) 100 MG tablet Take 1 tablet (100 mg total) by mouth 2 (two) times daily. (Patient not taking: Reported on 03/04/2024) 14 tablet 0 Not Taking   norethindrone (MICRONOR) 0.35 MG tablet Take 1 tablet (0.35 mg total) by mouth daily. (Patient not taking: Reported on 03/04/2024) 90 tablet 3 Not Taking   rizatriptan (MAXALT) 10 MG tablet Take by mouth as needed.  (Patient not taking: Reported on 03/04/2024)   Not Taking   Social History   Socioeconomic History   Marital status: Married    Spouse name: Not on file   Number of children: Not on file   Years of education: Not on file   Highest education level: Not on file  Occupational History   Not on file  Tobacco Use   Smoking status: Former    Current packs/day: 0.00    Types: Cigarettes    Quit date: 08/2018    Years since quitting: 5.5   Smokeless tobacco: Never  Vaping Use   Vaping status: Former  Substance and Sexual Activity   Alcohol use: No    Comment: last ETOH 09/05/20   Drug use: No   Sexual activity: Yes    Birth control/protection: I.U.D.  Other Topics Concern   Not on file  Social History Narrative   Not on file   Social Drivers of Health   Financial Resource Strain: Low Risk  (12/22/2021)   Received from [facility], [facility]   Overall Financial Resource Strain (CARDIA)  Difficulty of Paying Living Expenses: Not hard at all  Food Insecurity: No Food Insecurity (12/22/2021)   Received from [facility], [facility]   Hunger Vital Sign    Worried About Running Out of Food in the Last Year: Never true    Ran Out of Food in the Last Year: Never true  Transportation Needs: No Transportation Needs (12/22/2021)   Received from [facility], [facility]   [facility] - Transportation    In the past 12 months, has lack of transportation kept you from medical appointments or from getting  medications?: No    Lack of Transportation (Non-Medical): No  Physical Activity: Insufficiently Active (12/22/2021)   Received from [facility], [facility]   Exercise Vital Sign    Days of Exercise per Week: 7 days    Minutes of Exercise per Session: 20 min  Stress: Stress Concern Present (12/22/2021)   Received from [facility], [facility]   [employer] of [facility]ational [facility] - Occupational Stress Questionnaire    Feeling of Stress : To some extent  Social Connections: Moderately Isolated (12/22/2021)   Received from [facility], [facility]   Social Connection and Isolation Panel [NHANES]    Frequency of Communication with Friends and Family: More than three times a week    Frequency of Social Gatherings with Friends and Family: More than three times a week    Attends Religious Services: Never    Database administrator or Organizations: No    Attends Engineer, structural: Never    Marital Status: Married  Catering manager Violence: Not on file    Family History  Problem Relation Age of Onset   Hyperlipidemia Mother    Heart failure Father    Diabetes Father       Review of systems complete and found to be negative unless listed above      PHYSICAL EXAM  General: Well developed, well nourished, in no acute distress HEENT:  Normocephalic and atramatic Neck:  No JVD.  Lungs: Clear bilaterally to auscultation and percussion. Heart: HRRR . Normal S1 and S2 without gallops or murmurs.  Abdomen: Bowel sounds are positive, abdomen soft and non-tender  Msk:  Back normal, normal gait. Normal strength and tone for age. Extremities: No clubbing, cyanosis or edema.   Neuro: Alert and oriented X 3. Psych:  Good affect, responds appropriately  Labs:   Lab Results  Component Value Date   WBC 12.0 (H) 03/04/2024   HGB 10.5 (L) 03/04/2024   HCT 32.1 (L)  03/04/2024   MCV 80.0 03/04/2024   PLT 288 03/04/2024    Recent Labs  Lab 03/04/24 0922  NA 137  K 3.5  CL 110  CO2 20*  BUN 8  CREATININE 0.40*  CALCIUM 8.5*  PROT 6.6  BILITOT 0.4  ALKPHOS 59  ALT 14  AST 12*  GLUCOSE 90   Lab Results  Component Value Date   TROPONINI <0.03 02/21/2019   No results found for: "CHOL" No results found for: "HDL" No results found for: "LDLCALC" No results found for: "TRIG" No results found for: "CHOLHDL" No results found for: "LDLDIRECT"    Radiology: DG Chest 2 View Result Date: 03/04/2024 CLINICAL DATA:  Shortness of breath dizziness EXAM: CHEST - 2 VIEW COMPARISON:  07/18/2020 FINDINGS: The heart size and mediastinal contours are within normal limits. Both lungs are  clear. The visualized skeletal structures are unremarkable. IMPRESSION: No active cardiopulmonary disease. Electronically Signed   By: Judie Petit.  Shick M.D.   On: 03/04/2024 12:42    ASSESSMENT AND PLAN:  Palpitations, dizziness and intermittent shortness of breath POTS  She is euvolemic on exam.  Normal troponin, BNP and D-dimer. Echocardiogram today with normal biventricular systolic function no significant valvular abnormality. She has history of POTS/inappropriate sinus tachycardia which is likely worsened in pregnancy. Adequate hydration which patient is already doing Avoid caffeinated drinks Metoprolol 25 mg twice daily to help with her symptoms Outpatient follow-up Plan discussed with primary team  Signed: Kathryne Gin MD 03/05/2024, 10:59 AM

## 2024-03-05 NOTE — Discharge Instructions (Signed)
 Please take blood pressure TWICE A DAY (in AM and in PM) and bring log for review at your appointment on March 11, 2024.

## 2024-03-05 NOTE — Discharge Summary (Signed)
 Patient ID: Brenda Fuentes MRN: [id number] DOB/AGE: Apr 21, 1996 28 y.o.  Admit date: 03/04/2024 Discharge date: 03/05/2024  Admission Diagnoses: Brenda Fuentes, 29 y.o. [id number]  at [redacted]w[redacted]d is admitted for dizziness and SOB.   Discharge Diagnoses: Brenda Fuentes, 28 y.o. [id number] at [redacted]w[redacted]d is stable for discharge. She reports intermittent dizziness after eating a meal today (03/05/2024) and occasional palpitations when she stands up. Denies chest pain/pressure. Cardiology and Neurology performed a consult at bedside and both deemed she was stable for discharge.   Factors complicating pregnancy: H/o migraines 2.   H/o nonepileptic seizures 3.   H/o anxiety and depression 4.   H/o gHTN 5.   H/o multiple sclerosus 6.   Obesity 7.   H/o 36wk preterm delivery 8.   Former smoker  Prenatal Procedures: Echocardiogram (03/05/2024); Results: WNL EKG (03/04/2024) Results: WNL The following labs collected (03/04/2024) - CBC--> WNL  - CMP--> WNL - BNP--WNL - Troponins--> WNL - D-dimer--> WNL - Chest x-ray--> WNL   Consults: Cardiology Corky Sing, MD) and Neurology Selina Cooley, MD)  Significant Diagnostic Studies:  Results for orders placed or performed during the hospital encounter of 03/04/24 (from the past week)  Glucose, capillary   Collection Time: 03/04/24  9:20 AM  Result Value Ref Range   Glucose-Capillary 81 70 - 99 mg/dL  Urinalysis, Routine w reflex microscopic -Urine, Clean Catch   Collection Time: 03/04/24  9:22 AM  Result Value Ref Range   Color, Urine STRAW (A) YELLOW   APPearance HAZY (A) CLEAR   Specific Gravity, Urine 1.003 (L) 1.005 - 1.030   pH 8.0 5.0 - 8.0   Glucose, UA NEGATIVE NEGATIVE mg/dL   Hgb urine dipstick NEGATIVE NEGATIVE   Bilirubin Urine NEGATIVE NEGATIVE   Ketones, ur NEGATIVE NEGATIVE mg/dL   Protein, ur NEGATIVE NEGATIVE mg/dL   Nitrite NEGATIVE NEGATIVE   Leukocytes,Ua SMALL (A) NEGATIVE   RBC / HPF 0-5 0 - 5 RBC/hpf   WBC, UA 0-5 0 - 5 WBC/hpf    Bacteria, UA RARE (A) NONE SEEN   Squamous Epithelial / HPF 6-10 0 - 5 /HPF  CBC   Collection Time: 03/04/24  9:22 AM  Result Value Ref Range   WBC 12.0 (H) 4.0 - 10.5 K/uL   RBC 4.01 3.87 - 5.11 MIL/uL   Hemoglobin 10.5 (L) 12.0 - 15.0 g/dL   HCT 62.1 (L) 30.8 - 65.7 %   MCV 80.0 80.0 - 100.0 fL   MCH 26.2 26.0 - 34.0 pg   MCHC 32.7 30.0 - 36.0 g/dL   RDW 84.6 96.2 - 95.2 %   Platelets 288 150 - 400 K/uL   nRBC 0.0 0.0 - 0.2 %  Comprehensive metabolic panel   Collection Time: 03/04/24  9:22 AM  Result Value Ref Range   Sodium 137 135 - 145 mmol/L   Potassium 3.5 3.5 - 5.1 mmol/L   Chloride 110 98 - 111 mmol/L   CO2 20 (L) 22 - 32 mmol/L   Glucose, Bld 90 70 - 99 mg/dL   BUN 8 6 - 20 mg/dL   Creatinine, Ser 8.41 (L) 0.44 - 1.00 mg/dL   Calcium 8.5 (L) 8.9 - 10.3 mg/dL   Total Protein 6.6 6.5 - 8.1 g/dL   Albumin 2.9 (L) 3.5 - 5.0 g/dL   AST 12 (L) 15 - 41 U/L   ALT 14 0 - 44 U/L   Alkaline Phosphatase 59 38 - 126 U/L   Total Bilirubin 0.4 0.0 - 1.2 mg/dL  GFR, Estimated >60 >60 mL/min   Anion gap 7 5 - 15  Protein / creatinine ratio, urine   Collection Time: 03/04/24  9:22 AM  Result Value Ref Range   Creatinine, Urine 15 mg/dL   Total Protein, Urine <6 mg/dL   Protein Creatinine Ratio        0.00 - 0.15 mg/mg[Cre]  Brain natriuretic peptide   Collection Time: 03/04/24 12:43 PM  Result Value Ref Range   B Natriuretic Peptide 17.3 0.0 - 100.0 pg/mL  Troponin I (High Sensitivity)   Collection Time: 03/04/24 12:43 PM  Result Value Ref Range   Troponin I (High Sensitivity) 2 <18 ng/L  Troponin I (High Sensitivity)   Collection Time: 03/04/24  2:49 PM  Result Value Ref Range   Troponin I (High Sensitivity) 3 <18 ng/L  Resp panel by RT-PCR (RSV, Flu A&B, Covid) Anterior Nasal Swab   Collection Time: 03/04/24  3:07 PM   Specimen: Anterior Nasal Swab  Result Value Ref Range   SARS Coronavirus 2 by RT PCR NEGATIVE NEGATIVE   Influenza A by PCR NEGATIVE NEGATIVE    Influenza B by PCR NEGATIVE NEGATIVE   Resp Syncytial Virus by PCR NEGATIVE NEGATIVE  D-dimer, quantitative   Collection Time: 03/04/24  5:02 PM  Result Value Ref Range   D-Dimer, Quant 0.33 0.00 - 0.50 ug/mL-FEU  ECHOCARDIOGRAM COMPLETE   Collection Time: 03/05/24 10:01 AM  Result Value Ref Range   Weight 3,488 oz   Height 65 in   BP 140/75 mmHg   Ao pk vel 1.34 m/s   AV Area VTI 2.55 cm2   AR max vel 2.13 cm2   AV Mean grad 4.0 mmHg   AV Peak grad 7.2 mmHg   S' Lateral 2.90 cm   AV Area mean vel 2.28 cm2   Area-P 1/2 5.23 cm2   Est EF 65 - 70%     Hospital Course:  This is a 28 y.o. [id number] with IUP at [redacted]w[redacted]d admitted for dizziness and SOB.  NST reactive. Elevated blood pressures noted during hospital course. Blood pressures range from 128-147/70-85. Denies signs/symptoms of Pre-E. No signs/symptoms of preterm labor or other maternal-fetal concerns. She was deemed stable for discharge to home with outpatient follow up.  Discharge Physical Exam:  BP (!) 140/75 (BP Location: Left Arm)   Pulse 93   Temp 98.3 F (36.8 C) (Oral)   Resp 16   Ht 5\' 5"  (1.651 m)   Wt 98.9 kg   LMP  (LMP Unknown)   SpO2 98%   BMI 36.28 kg/m   General: NAD CV: RRR Pulm: CTABL, nl effort ABD: s/nd/nt, gravid DVT Evaluation: No evidence of DVT on exam.  NST: FHR baseline: 150 bpm Variability: moderate Accelerations: yes Decelerations: none Time: approx. 20 minutes Category/reactivity: reactive TOCO: None SVE: deferred   ** Broken tracing due to frequent fetal movement due to gestational age**   Assessment/Plan: - Cardiology consult complete. Patient diagnosed with POTS by cardiology Corky Sing, MD)  - Start on metoprolol 25 mg BID  - Prescription for metoprolol 25 mg BID sent to preferred pharmacy.  - Encourage adequate hydration - Patient advised to avoid caffeinated drinks  - Plan to follow-up outpatient with Alluri, MD (cardiology) in approximately 1    week - Neurology consult  complete; Selina Cooley, MD at bedside for consult (03/05/2024)  - Per Selina Cooley, MD symptoms not related with MS  S[facility]ll Burger, MD at [facility] is managing lamoTRIgine levels  - Patient advised to follow-up  with Sherryll Burger, MD for medication regimen related to    MS. Patient verbalized understanding and states she will schedule an      appointment. - Plan to follow-up at [facility] office on Monday for BP check d/t elevated blood pressures.  - Blood pressure log provided in AVS and patient instructed to log blood                 pressures twice a day (in AM and in PM) and bring log for review at BP                check visit. Patient verbalized understanding.    - BP visit scheduled for 03/11/2024 with Tami Lin, CNM.  - Patient advised to notify OB provider if symptoms worsen or do not improve.   Discharge Condition: Stable  Disposition: Discharge disposition: 01-Home or Self Care       Allergies as of 03/05/2024       Reactions   Penicillins Hives        Medication List     STOP taking these medications    doxycycline 100 MG tablet Commonly known as: VIBRA-TABS   norethindrone 0.35 MG tablet Commonly known as: MICRONOR   rizatriptan 10 MG tablet Commonly known as: MAXALT       TAKE these medications    aspirin 81 MG chewable tablet Chew 81 mg by mouth daily.   lamoTRIgine 25 MG tablet Commonly known as: LAMICTAL Take 50 mg by mouth daily.   metoprolol tartrate 25 MG tablet Commonly known as: LOPRESSOR Take 1 tablet (25 mg total) by mouth 2 (two) times daily.   prenatal multivitamin Tabs tablet Take 1 tablet by mouth daily at 12 noon.        Follow-up Information     Alluri, Meryl Dare, MD Follow up in 1 week(s).   Specialty: Cardiology Why: POTS Contact information: [address] [place] [address] [phone]         Gustavo Lah, CNM Follow up on 03/11/2024.   Specialty: Certified Nurse Midwife Why: BP Check Contact information: [address] [place] [address] [phone]                 Signed:  Roney Jaffe, CNM 03/05/2024 2:08 PM

## 2024-03-05 NOTE — Progress Notes (Signed)
*  PRELIMINARY RESULTS* Echocardiogram 2D Echocardiogram has been performed.  Brenda Fuentes 03/05/2024, 10:01 AM

## 2024-03-11 DIAGNOSIS — G90A Postural orthostatic tachycardia syndrome (POTS): Secondary | ICD-10-CM | POA: Diagnosis present

## 2024-03-11 NOTE — Progress Notes (Signed)
 Established Patient Visit   Chief Complaint: POTS, palpitation, shortness of breath Chief Complaint  Patient presents with  . Follow-up    [facility] Shortness of breath    Date of Service: 03/11/2024 Date of Birth: 31-Jan-1996 PCP: Sherial Bail, MD  History of Present Illness: Ms. Hottenstein is a 28 y.o.female patient who presented to our clinic for posthospital discharge follow-up with symptoms of palpitation, dizziness and some shortness of breath.  She has history of possible POTS and was treated with beta-blockers in the past.  In last few weeks she has increased symptoms of palpitations with dizziness and intermittent shortness of breath.  Recently was monitored in observation without any significant arrhythmias.  Echocardiogram 02/2024 with normal biventricular systolic function and no significant valvular abnormality.  Troponin, BNP, D-dimer normal.  Discharged home on p.o. metoprolol .  Today patient detailed that overall her symptoms are manageable at this time.  States that she would like to increase metoprolol  dose in future if her symptoms get worse which we discussed is reasonable to do.  Past Medical and Surgical History  Past Medical History Past Medical History:  Diagnosis Date  . Allergic rhinitis due to allergen   . Allergy    Penicilin  . Anxiety   . Depression   . GERD (gastroesophageal reflux disease)    After having my son  . H. pylori infection 03/12/2020  . Inappropriate sinus tachycardia, so stated (CMS/HHS-HCC)   . Menstrual problem, unspecified   . Migraines   . MS (multiple sclerosis) (CMS/HHS-HCC)   . Obesity (BMI 30-39.9), unspecified   . Pregnancy-induced hypertension in third trimester (HHS-HCC) 06/01/2016   NST's 2 x a week and Growth US  at 32 and 36 weeks. 24 h urine for protein/creatinine.   . Seizures (CMS/HHS-HCC)    Non epileptic    Past Surgical History She has a past surgical history that includes Tonsillectomy.   Medications and Allergies   Current Medications  Current Outpatient Medications  Medication Sig Dispense Refill  . aspirin  81 MG chewable tablet Take 81 mg by mouth once daily    . lamoTRIgine  (LAMICTAL ) 25 MG tablet     . metoprolol  TARTrate (LOPRESSOR ) 25 MG tablet Take 1 tablet (25 mg total) by mouth 2 (two) times daily for 30 days 60 tablet 0  . prenatal vit-iron fum-folic ac (PRENAVITE) tablet Take 1 tablet by mouth once daily    . ARIPiprazole (ABILIFY) 5 MG tablet  (Patient not taking: Reported on 12/12/2023)    . blood glucose diagnostic test strip 1 each (1 strip total) 4 (four) times daily Use as instructed. (Patient not taking: Reported on 03/11/2024) 100 each 12  . blood glucose meter kit as directed (Patient not taking: Reported on 03/11/2024) 1 each 0  . KESIMPTA PEN 20 mg/0.4 mL PnIj Inject 20 mg subcutaneously every 28 (twenty-eight) days (Patient not taking: Reported on 03/11/2024) 1.2 mL 3  . lancets Use 1 each 4 (four) times daily Use as instructed. (Patient not taking: Reported on 03/11/2024) 100 each 12  . pantoprazole (PROTONIX) 40 MG DR tablet Take 1 tablet (40 mg total) by mouth once daily (Patient not taking: Reported on 03/11/2024) 90 tablet 3   No current facility-administered medications for this visit.    Allergies: Penicillins and Melatonin  Social and Family History  Social History  reports that she quit smoking about 5 years ago. Her smoking use included cigarettes. She started smoking about 10 years ago. She has a 0.2 pack-year smoking history. She has  never used smokeless tobacco. She reports that she does not currently use alcohol. She reports that she does not use drugs.  Family History Family History  Problem Relation Name Age of Onset  . Hyperlipidemia (Elevated cholesterol) Mother Bari Mayer   . Diabetes type II Father Franky Mayer   . High blood pressure (Hypertension) Father Franky Mayer   . Hyperlipidemia (Elevated cholesterol) Father Franky Mayer   . Heart failure  Father Franky Mayer   . Heart disease Father Franky Mayer   . Kidney failure Father Franky Mayer   . Diabetes Father Franky Mayer   . Diabetes type II Maternal Grandmother Richardson Moynahan   . High blood pressure (Hypertension) Maternal Grandmother Richardson Moynahan   . Obesity Maternal Grandmother Richardson Moynahan   . Diabetes Maternal Grandmother Richardson Moynahan   . Leukemia Maternal Grandfather Vinie Novak Sr.   . Coronary Artery Disease (Blocked arteries around heart) Maternal Grandfather Vinie Novak Sr.   . Cancer Maternal Grandfather Vinie Novak Sr.   . Diabetes type II Paternal Grandmother Asberry Mayer   . Stroke Paternal Grandmother Asberry Mayer   . Clotting disorder Paternal Grandmother Asberry Mayer   . Diabetes Paternal Grandmother Asberry Mayer   . Chronic kidney disease Paternal Grandfather Lynwood Mayer   . Diabetes type II Paternal Grandfather Lynwood Mayer   . Cirrhosis Paternal Grandfather Lynwood Mayer   . Alcohol abuse Paternal Grandfather Lynwood Mayer   . Post-traumatic stress disorder Sister Charmaine   . Depression Sister Charmaine   . Hyperlipidemia (Elevated cholesterol) Sister Charmaine   . High blood pressure (Hypertension) Sister Charmaine   . No Known Problems Son Stuart   . No Known Problems Daughter Kasandra     Review of Systems   Review of Systems: Palpitations  Physical Examination   Vitals:BP 126/74   Pulse 85   Ht 167.6 cm (5' 6)   Wt 100.7 kg (222 lb)   LMP 09/12/2023 (Exact Date)   SpO2 98%   BMI 35.83 kg/m  Ht:167.6 cm (5' 6) Wt:100.7 kg (222 lb) ADJ:Anib surface area is 2.17 meters squared. Body mass index is 35.83 kg/m.  HEENT: Pupils equally reactive to light and accomodation  Neck: Supple, no significant JVD Lungs: clear to auscultation bilaterally; no wheezes, rales, rhonchi Heart: Regular rate and rhythm. No murmur Extremities: no pedal edema  Assessment and Plan   28 y.o. female with  1. POTS (postural orthostatic  tachycardia syndrome)   2. Heart palpitations   3. Dizziness    Recent evaluation while in observation with normal echo, BNP and D-dimer. Continue adequate hydration and regular activity Continue metoprolol  which is overall helping with her symptoms.   We discussed that we can consider increasing the dose if needed in future.  No orders of the defined types were placed in this encounter.   Return in about 6 weeks (around 04/22/2024).  KRISHNA CHAITANYA ALLURI, MD  This dictation was prepared with dragon dictation. Any transcription errors that result from this process are unintentional.

## 2024-03-19 NOTE — Progress Notes (Signed)
Completed FMLA paperwork

## 2024-03-19 NOTE — Progress Notes (Signed)
 I received completed FMLA forms from Dr. Mauricia Fairly on 03/19/24. I sent to patient via MyChart message. ADS

## 2024-04-02 NOTE — Progress Notes (Signed)
 Placed lamotrigine  level order.

## 2024-04-03 NOTE — Progress Notes (Signed)
 Subjective:     Patient ID: Brenda Fuentes is a 28 y.o.  Currently [redacted]w[redacted]d, feeling baby move  URI  This is a new problem. The current episode started yesterday. The problem has been gradually worsening. There has been no fever. Associated symptoms include congestion, coughing, ear pain, headaches, nausea, a plugged ear sensation, rhinorrhea, sneezing and a sore throat. Pertinent negatives include no chest pain, diarrhea, dysuria, rash, sinus pain, swollen glands, vomiting or wheezing.    The following portions of the patient's history were reviewed and updated as appropriate.  Past Medical History:  has a past medical history of Allergic rhinitis due to allergen, Allergy, Anxiety, Depression, GERD (gastroesophageal reflux disease), H. pylori infection (03/12/2020), Inappropriate sinus tachycardia, so stated (CMS/HHS-HCC), Menstrual problem, unspecified, Migraines, MS (multiple sclerosis) (CMS/HHS-HCC), Obesity (BMI 30-39.9), unspecified, Pregnancy-induced hypertension in third trimester (HHS-HCC) (06/01/2016), and Seizures (CMS/HHS-HCC).  Problem List: has Anxiety; H/O hyperlipidemia; Former smoker; Vitamin D  deficiency; Obesity (BMI 30-39.9), unspecified; Anxiety and depression; Generalized anxiety disorder; Major depressive disorder, recurrent episode, moderate (CMS/HHS-HCC); MS (multiple sclerosis) (CMS/HHS-HCC); Reaction to cell-mediated gamma interferon antigen response test without active tuberculosis; Heart palpitations; Inappropriate sinus tachycardia (CMS/HHS-HCC); Supervision of high risk pregnancy in second trimester (HHS-HCC); Obesity in pregnancy, antepartum (HHS-HCC); and POTS (postural orthostatic tachycardia syndrome) on their problem list.  Past Surgical History:  has a past surgical history that includes Tonsillectomy.  Family History: family history includes Alcohol abuse in her paternal grandfather; Cancer in her maternal grandfather; Chronic kidney disease in her paternal  grandfather; Cirrhosis in her paternal grandfather; Clotting disorder in her paternal grandmother; Coronary Artery Disease (Blocked arteries around heart) in her maternal grandfather; Depression in her sister; Diabetes in her father, maternal grandmother, and paternal grandmother; Diabetes type II in her father, maternal grandmother, paternal grandfather, and paternal grandmother; Heart disease in her father; Heart failure in her father; High blood pressure (Hypertension) in her father, maternal grandmother, and sister; Hyperlipidemia (Elevated cholesterol) in her father, mother, and sister; Kidney failure in her father; Leukemia in her maternal grandfather; No Known Problems in her daughter and son; Obesity in her maternal grandmother; Post-traumatic stress disorder in her sister; Stroke in her paternal grandmother.  Social History:  reports that she quit smoking about 5 years ago. Her smoking use included cigarettes. She started smoking about 10 years ago. She has a 0.2 pack-year smoking history. She has never used smokeless tobacco. She reports that she does not currently use alcohol. She reports that she does not use drugs.  Current Medications: has a current medication list which includes the following prescription(s): aspirin , metoprolol  tartrate, prenatal vit-iron fum-folic ac, aripiprazole, azithromycin, blood glucose meter, lamotrigine , lancets, and pantoprazole.  Prior to encounter Medications:  Current Outpatient Medications on File Prior to Visit  Medication Sig Dispense Refill  . aspirin  81 MG chewable tablet Take 81 mg by mouth once daily    . metoprolol  TARTrate (LOPRESSOR ) 25 MG tablet Take 1 tablet (25 mg total) by mouth 2 (two) times daily for 30 days 60 tablet 0  . prenatal vit-iron fum-folic ac (PRENAVITE) tablet Take 1 tablet by mouth once daily    . ARIPiprazole (ABILIFY) 5 MG tablet  (Patient not taking: Reported on 12/12/2023)    . blood glucose meter kit as directed (Patient not  taking: Reported on 03/11/2024) 1 each 0  . lamoTRIgine  (LAMICTAL  XR) 50 mg XR tablet Take 50 mg by mouth once daily    . lancets Use 1 each 4 (four) times daily Use as  instructed. (Patient not taking: Reported on 03/11/2024) 100 each 12  . pantoprazole (PROTONIX) 40 MG DR tablet Take 1 tablet (40 mg total) by mouth once daily (Patient not taking: Reported on 04/03/2024) 90 tablet 3   No current facility-administered medications on file prior to visit.    Allergies: is allergic to penicillins and melatonin.  Review of Systems  Constitutional:  Negative for chills, fever and malaise/fatigue.  HENT:  Positive for congestion, ear pain, rhinorrhea, sneezing and sore throat. Negative for sinus pain and tinnitus.   Eyes:  Negative for pain, discharge and redness.  Respiratory:  Positive for cough. Negative for sputum production, shortness of breath and wheezing.   Cardiovascular:  Negative for chest pain.  Gastrointestinal:  Positive for nausea. Negative for diarrhea and vomiting.  Genitourinary:  Negative for dysuria.  Musculoskeletal:  Negative for myalgias.  Skin:  Negative for rash.  Neurological:  Positive for headaches.       Objective:   Physical Exam Vitals and nursing note reviewed.  Constitutional:      General: She is not in acute distress.    Appearance: Normal appearance. She is well-developed. She is not ill-appearing.  HENT:     Head: Normocephalic and atraumatic.     Right Ear: External ear normal. A middle ear effusion is present. Tympanic membrane is injected.     Left Ear: External ear normal. A middle ear effusion is present.     Nose: Nose normal. No congestion or rhinorrhea.     Right Sinus: No maxillary sinus tenderness or frontal sinus tenderness.     Left Sinus: No maxillary sinus tenderness or frontal sinus tenderness.     Mouth/Throat:     Mouth: Mucous membranes are moist.     Pharynx: Oropharynx is clear. Posterior oropharyngeal erythema and postnasal drip  present. No oropharyngeal exudate.     Tonsils: No tonsillar exudate or tonsillar abscesses. 0 on the right. 0 on the left.  Eyes:     Conjunctiva/sclera: Conjunctivae normal.     Pupils: Pupils are equal, round, and reactive to light.  Neck:     Thyroid : No thyromegaly.     Trachea: No tracheal deviation.  Cardiovascular:     Rate and Rhythm: Normal rate and regular rhythm.     Heart sounds: Normal heart sounds.  Pulmonary:     Effort: Pulmonary effort is normal.     Breath sounds: Normal breath sounds.  Chest:     Chest wall: No tenderness.  Musculoskeletal:        General: Normal range of motion.     Cervical back: Normal range of motion and neck supple.  Lymphadenopathy:     Cervical: No cervical adenopathy.  Skin:    General: Skin is warm and dry.  Neurological:     General: No focal deficit present.     Mental Status: She is alert and oriented to person, place, and time.  Psychiatric:        Mood and Affect: Mood normal.        Behavior: Behavior normal.     Results for orders placed or performed in visit on 04/03/24  Extended Respiratory Viral Panel - [facility]   Specimen: Nasal Swab; Other  Result Value Ref Range   Influenza A PCR Negative Negative   Influenza B PCR Negative Negative   RSV PCR Negative Negative   SARS-CoV2 PCR Positive (!) Negative   Narrative   Positive  Positive results are indicative of the presence  of the identified virus, but do not rule out bacterial infection or co-infection with other pathogens not detected by the test. Clinical correlation with patient history and other diagnostic information is necessary to determine patient infection status.  Negative  Negative results do not preclude SARS-CoV-2 infection, Influenza A virus, Influenza B virus and/or RSV infection and should not be used as the sole basis for treatment or other patient management decisions. Negative results must be combined with clinical observations, patient history,  and/or epidemiological information.  Test Comments:  This test has not been FDA cleared or approved. This test has been authorized by FDA under an Emergency Use Authorization (EUA). This test is only authorized for the duration of the declaration that circumstances exist justifying the authorization of emergency use of in vitro diagnostics for detection and/or diagnosis of COVID-19 under Section 564(b) (1) of the Act, 21 U.S.C. 360bbb-3 (b) (1), unless the authorization is terminated or revoked sooner.       Assessment:     1. COVID-19 virus detected -     azithromycin (ZITHROMAX) 250 MG tablet; 2 tabs on day one, then 1 tab daily x 4 days  Dispense: 6 tablet; Refill: 0  2. Encntr for obs for susp expsr to oth biolg agents ruled out -     Extended Respiratory Viral Panel - [facility]  Discussed benefits vs risks of antivirals, declined.     Plan:     Patient Instructions  You are being treated for COVID. You are being prescribed antibiotics, please take as directed. Warm compresses, nasal saline washes, steam showers, warm salt water gargles for further symptomatic relief. OTC Flonase nasal spray for congestion. Tylenol  per package instructions for pain relief. Follow up in clinic if symptoms persist despite treatment. Go to ED if develop chest pain, shortness of breath, sudden/severe headache, neurologic symptoms, changes in vision, neck stiffness.  Please stay home until you are without fever for 24 hours without use of medications AND symptoms are improving. Mask for an additional 5 days.

## 2024-04-08 LAB — OB RESULTS CONSOLE HIV ANTIBODY (ROUTINE TESTING): HIV: NONREACTIVE

## 2024-04-15 NOTE — Progress Notes (Signed)
 Obstetrics & Gynecology Office Visit  Subjective  Brenda Fuentes is a 28 y.o. [id number] at [redacted]w[redacted]d being seen today for ongoing prenatal care.  She is currently monitored for tthis high-risk pregnancy.  Patient's last menstrual period was 09/12/2023 (exact date). Estimated Date of Delivery: 06/25/24  History of Present Illness   Patient concerns today: lower pelvic pain  Pt denies contractions, vaginal bleeding, leaking fluid. Endorses good fetal movementfrequent Pt denies HA, VD or RUQ pain.   Objective  BP 118/82   Pulse 76   Ht 167.6 cm (5' 5.98)   Wt (!) 101.7 kg (224 lb 3.2 oz)   LMP 09/12/2023 (Exact Date)   BMI 36.20 kg/m    Fetal Status:          Pre-pregnant weight: 102.5 kg (226 lb)  TWG: -0.816 kg (-1 lb 12.8 oz)   Gen: NAD  Pulm: No use of accessory muscles, normal respirations Abdomen: Gravid, nontender Ext : no edema, no rashes.   Psych: Mood, insight, judgement intact SVE: deferred FHT by doppler: 138 bpm distinguished from mom Fundal height: 31.5 cm S>D  Assessment   28 y.o. [id number] at [redacted]w[redacted]d by  06/25/2024, by Ultrasound presenting for routine prenatal visit  Plan   Problem list reviewed and/or updated   Assessment & Plan     Orders Placed This Encounter  Procedures  . Tdap (>=79YR) vaccine (Adacel/Boostrix)(No Medicare Patients)    1. Supervision of high risk pregnancy in third trimester (HHS-HCC) - CBC, UA ordered . Rh: positive -1h OGTT-declined checked bg qid x 1 week -Tdap vaccine risk/benefits discussed.  Tdap Accepts TdaP after counseling, to be given in clinic today. LD -Blood consent reviewed and signed yes -EPDS 14 today #10: No -Contraceptive options reviewed.   -Desires bilateral tubal ligation -Feeding options reviewed.  Risks of formula feeding and benefits of breastfeeding discussed.   -Considering breast -Kick counts to start at 28 weeks reviewed with the patient in detail.  Patient instructed to assess for fetal activity  daily at regular intervals. Counts to be done if decreased activity noted. Patient instructed to contact the office if counts do not reveal adequate movements. -Preterm labor precautions (ROM, vaginal bleeding, contractions/lower back pain, change in discharge), along with how and when to call the office, reviewed. Patient verbalized understanding.   2. Obesity in pregnancy, antepartum (HHS-HCC) --0.816 kg (-1 lb 12.8 oz)   3. Elevated blood pressure complicating pregnancy, antepartum (HHS-HCC) Hx of Gestational HTN  ASA - discussed need, patient is aware 03/11/2024 - BP's mostly WNL, occasional mild range at home, PreE labs WNL  Continue to monitor BP's at home  03/25/24 [facility] labs WNL  4. POTS (postural orthostatic tachycardia syndrome) diagnosed 03/05/24 Meds: Metoprolol  25 mg BID   5. Seizures (CMS/HHS-HCC) Medications prior to pregnancy: none Triggers: Stress, Anxiety  6. History of gestational hypertension Hx of Gestational HTN  ASA - discussed need, patient is aware 03/11/2024 - BP's mostly WNL, occasional mild range at home, PreE labs WNL  Continue to monitor BP's at home   7. Anxiety and depression 12/12/23 NOB EPDS 16 with a 1 on #10 Medications prior to pregnancy: Abilify 5mg ; Lamictal  25mg  off for about a year prior to pregnancy Medications during pregnancy: Pt would like to discuss medications with MFM - as of 03/25/24 Has not seen MFM - they tried reaching out to her and she did not pick up  8. MS (multiple sclerosis) (CMS/HHS-HCC) Medications prior to pregnancy: Kesimpta Pen 20mg /0.13mL Q28 days -  last dose in October; Lyrica 25mg  TID - stopped in ~ Sept/Oct Refer to MFM place 12/14/23 - as of 03/25/24 Has not seen MFM - they tried reaching out to her and she did not pick up Next neurologist appt on 12/14/23 No preferred mode of delivery in the setting of MS No contraindication to epidural anesthesia Recommend resuming ofatumumab within 2 to 4 weeks of delivery  9.  Former smoker Last time was the end October   10. Pregnancy related hip pain in third trimester, antepartum (HHS-HCC) - no complaints today  11. Elevated glucose tolerance test Passed 3hr GTT Does not want to repeat 3hr GTT @ 28wks, will check blood sugar QID (fasting and 2hr postprandial) for 1wk   12. History of COVID-19 Which trimester? 3rd (04/03/24) Growth u/s in 3rd Trimester Weekly NSTs at 36 wks   13. History of preterm delivery 36 weeks - first pregnancy     FKC reviewed.  Pre-term labor precautions reviewed.  and Call with bleeding, contractions, PROM, or decreased fetal movement.   Return in about 2 weeks (around 04/29/2024) for Routine OB.   Attestation Statement:   I personally performed the service, non-incident to. (WP)   FELICIA L DICKERSON, CNM

## 2024-04-25 ENCOUNTER — Other Ambulatory Visit: Payer: Self-pay

## 2024-04-25 ENCOUNTER — Observation Stay
Admission: EM | Admit: 2024-04-25 | Discharge: 2024-04-26 | Disposition: A | Discharge status: Home / Self Care | Attending: Obstetrics | Admitting: Obstetrics

## 2024-04-25 ENCOUNTER — Other Ambulatory Visit: Payer: Self-pay | Admitting: Obstetrics

## 2024-04-25 ENCOUNTER — Encounter: Payer: Self-pay | Admitting: Obstetrics and Gynecology

## 2024-04-25 DIAGNOSIS — R109 Unspecified abdominal pain: Secondary | ICD-10-CM | POA: Diagnosis not present

## 2024-04-25 DIAGNOSIS — Z7982 Long term (current) use of aspirin: Secondary | ICD-10-CM | POA: Insufficient documentation

## 2024-04-25 DIAGNOSIS — O133 Gestational [pregnancy-induced] hypertension without significant proteinuria, third trimester: Secondary | ICD-10-CM | POA: Diagnosis not present

## 2024-04-25 DIAGNOSIS — O42913 Preterm premature rupture of membranes, unspecified as to length of time between rupture and onset of labor, third trimester: Secondary | ICD-10-CM | POA: Insufficient documentation

## 2024-04-25 DIAGNOSIS — O4290 Premature rupture of membranes, unspecified as to length of time between rupture and onset of labor, unspecified weeks of gestation: Principal | ICD-10-CM | POA: Diagnosis present

## 2024-04-25 DIAGNOSIS — R102 Pelvic and perineal pain: Secondary | ICD-10-CM | POA: Diagnosis not present

## 2024-04-25 DIAGNOSIS — Z79899 Other long term (current) drug therapy: Secondary | ICD-10-CM | POA: Insufficient documentation

## 2024-04-25 DIAGNOSIS — O4700 False labor before 37 completed weeks of gestation, unspecified trimester: Secondary | ICD-10-CM | POA: Diagnosis present

## 2024-04-25 DIAGNOSIS — O09899 Supervision of other high risk pregnancies, unspecified trimester: Secondary | ICD-10-CM

## 2024-04-25 DIAGNOSIS — Z3A31 31 weeks gestation of pregnancy: Secondary | ICD-10-CM | POA: Diagnosis not present

## 2024-04-25 DIAGNOSIS — Z87891 Personal history of nicotine dependence: Secondary | ICD-10-CM | POA: Diagnosis not present

## 2024-04-25 DIAGNOSIS — O26893 Other specified pregnancy related conditions, third trimester: Secondary | ICD-10-CM | POA: Diagnosis present

## 2024-04-25 HISTORY — DX: Latent tuberculosis: Z22.7

## 2024-04-25 HISTORY — DX: Postural orthostatic tachycardia syndrome (POTS): G90.A

## 2024-04-25 HISTORY — DX: Multiple sclerosis, unspecified: G35.D

## 2024-04-25 HISTORY — DX: Multiple sclerosis: G35

## 2024-04-25 LAB — RUPTURE OF MEMBRANE (ROM)PLUS: Rom Plus: NEGATIVE

## 2024-04-25 LAB — CHLAMYDIA/NGC RT PCR (ARMC ONLY)
Chlamydia Tr: NOT DETECTED
N gonorrhoeae: NOT DETECTED

## 2024-04-25 LAB — URINALYSIS, ROUTINE W REFLEX MICROSCOPIC
Bacteria, UA: NONE SEEN
Bilirubin Urine: NEGATIVE
Glucose, UA: NEGATIVE mg/dL
Hgb urine dipstick: NEGATIVE
Ketones, ur: NEGATIVE mg/dL
Nitrite: NEGATIVE
Protein, ur: NEGATIVE mg/dL
Specific Gravity, Urine: 1.019 (ref 1.005–1.030)
pH: 6 (ref 5.0–8.0)

## 2024-04-25 LAB — WET PREP, GENITAL
Clue Cells Wet Prep HPF POC: NONE SEEN
Sperm: NONE SEEN
Trich, Wet Prep: NONE SEEN
WBC, Wet Prep HPF POC: >=10 — AB (ref <10)
Yeast Wet Prep HPF POC: NONE SEEN

## 2024-04-25 LAB — FETAL FIBRONECTIN: Fetal Fibronectin: NEGATIVE

## 2024-04-25 LAB — GROUP B STREP BY PCR: Group B strep by PCR: NEGATIVE

## 2024-04-25 MED ORDER — TERBUTALINE SULFATE 1 MG/ML IJ SOLN
0.2500 mg | Freq: Once | INTRAMUSCULAR | Status: AC | PRN
  Administered 2024-04-25: 0.25 mg via SUBCUTANEOUS
  Filled 2024-04-25: qty 1

## 2024-04-25 MED ORDER — BETAMETHASONE SOD PHOS & ACET 6 (3-3) MG/ML IJ SUSP
12.0000 mg | INTRAMUSCULAR | Status: AC
  Administered 2024-04-25 – 2024-04-26 (×2): 12 mg via INTRAMUSCULAR
  Filled 2024-04-25: qty 5

## 2024-04-25 MED ORDER — PRENATAL MULTIVITAMIN CH
1.0000 | ORAL_TABLET | Freq: Every day | ORAL | Status: DC
  Administered 2024-04-26: 1 via ORAL
  Filled 2024-04-25: qty 1

## 2024-04-25 MED ORDER — ACETAMINOPHEN 500 MG PO TABS: 1000.0000 mg | ORAL_TABLET | Freq: Four times a day (QID) | ORAL | Status: DC | PRN

## 2024-04-25 MED ORDER — LACTATED RINGERS IV BOLUS
1000.0000 mL | Freq: Once | INTRAVENOUS | Status: AC
  Administered 2024-04-25: 1000 mL via INTRAVENOUS

## 2024-04-25 NOTE — OB Triage Note (Signed)
 Pt is a 28yo G4P2, 31w 2d. She arrived to the unit with complaints of abdominal pain/perineal pressure every 2-88mins since 1420.  She states that the pain is 7/10 on ain scale. She states that she has had LOF since this morning that is intermittent and a trickle. She denies vaginal bleeding, reports positive fetal movement,. VS stable, monitors applied and assessing.   Initial FHT 155 at 1602.

## 2024-04-25 NOTE — H&P (Signed)
 ANTEPARTUM ADMISSION HISTORY AND PHYSICAL NOTE   History of Present Illness: Brenda Fuentes is a 28 y.o. [phone] at [redacted]w[redacted]d admitted for abdominal pain and pelvic pressure.The patient reports experiencing abdominal pain and pelvic pressure occurring every 2 to 3 minutes, which began around 14:20 today. She rates the pain as a 7 out of 10 on the pain scale. Additionally, she mentions having intermittent  (LOF) since this morning, described as a trickle.Patient reports the fetal movement as active.Patient reports uterine contraction  activity as regular, every 2-3 minutes.Patient reports  vaginal bleeding as none.Patient describes fluid per vagina as None.Fetal presentation is cephalic.  Patient Active Problem List   Diagnosis Date Noted   Leakage of amniotic fluid 04/25/2024   Abdominal pain affecting pregnancy 03/04/2024   Supervision of high risk pregnancy in second trimester 12/08/2023   Reaction to cell-mediated gamma interferon antigen response test without active tuberculosis 09/22/2020   Vitamin D  deficiency 10/09/2019   Generalized anxiety disorder 05/02/2019   Major depressive disorder, recurrent episode, moderate (HCC) 05/02/2019   Anxiety and depression 04/23/2019   History of preterm delivery 02/18/2019   History of gestational hypertension 02/18/2019    Past Medical History:  Diagnosis Date   Anxiety    Depression 2016   Gestational hypertension    Hypercholesteremia 2009   Multiple sclerosis (HCC)    POTS (postural orthostatic tachycardia syndrome)    TB lung, latent     Past Surgical History:  Procedure Laterality Date   NO PAST SURGERIES      OB History  Gravida Para Term Preterm AB Living  4 2 1 1 1 2   SAB IAB Ectopic Multiple Live Births  1   0 2    # Outcome Date GA Lbr Len/2nd Weight Sex Type Anes PTL Lv  4 Current           3 SAB 07/2023 [redacted]w[redacted]d         2 Term 08/20/19 [redacted]w[redacted]d 04:36 / 00:18 3010 g F Vag-Spont EPI  LIV  1 Preterm 07/04/16 [redacted]w[redacted]d 14:25 /  00:23 2560 g M Vag-Spont EPI  LIV    Social History   Socioeconomic History   Marital status: Married    Spouse name: Not on file   Number of children: Not on file   Years of education: Not on file   Highest education level: Not on file  Occupational History   Not on file  Tobacco Use   Smoking status: Former    Current packs/day: 0.00    Types: Cigarettes    Quit date: 08/2018    Years since quitting: 5.7   Smokeless tobacco: Never  Vaping Use   Vaping status: Former  Substance and Sexual Activity   Alcohol use: No    Comment: last ETOH 09/05/20   Drug use: No   Sexual activity: Yes    Birth control/protection: I.U.D.  Other Topics Concern   Not on file  Social History Narrative   Not on file   Social Drivers of Health   Financial Resource Strain: Low Risk  (12/22/2021)   Received from [facility], [facility]   Overall Financial Resource Strain (CARDIA)    Difficulty of Paying Living Expenses: Not hard at all  Food Insecurity: No Food Insecurity (12/22/2021)   Received from [facility], [facility]   Hunger Vital Sign    Worried About Running Out of Food in the Last Year: Never true  Ran Out of Food in the Last Year: Never true  Transportation Needs: No Transportation Needs (12/22/2021)   Received from [facility], [facility]   [facility] - Transportation    In the past 12 months, has lack of transportation kept you from medical appointments or from getting medications?: No    Lack of Transportation (Non-Medical): No  Physical Activity: Insufficiently Active (12/22/2021)   Received from [facility], [facility]   Exercise Vital Sign    Days of Exercise per Week: 7 days    Minutes of Exercise per Session: 20 min  Stress: Stress Concern Present (12/22/2021)   Received from [facility], [facility]   [employer] [facility] - Occupational Stress Questionnaire    Feeling of Stress : To some extent  Social Connections: Moderately Isolated (12/22/2021)   Received from [facility], [facility]   Social Connection and Isolation Panel [NHANES]    Frequency of Communication with Friends and Family: More than three times a week    Frequency of Social Gatherings with Friends and Family: More than three times a week    Attends Religious Services: Never    Database administrator or Organizations: No    Attends Engineer, structural: Never    Marital Status: Married    Family History  Problem Relation Age of Onset   Hyperlipidemia Mother    Heart failure Father    Diabetes Father     Allergies  Allergen Reactions   Penicillins Hives    Medications Prior to Admission  Medication Sig Dispense Refill Last Dose/Taking   aspirin  81 MG chewable tablet Chew 81 mg by mouth daily.      lamoTRIgine  (LAMICTAL ) 25 MG tablet Take 200 mg by mouth daily.      metoprolol  tartrate (LOPRESSOR ) 25 MG tablet Take 1 tablet (25 mg total) by mouth 2 (two) times daily. 60 tablet 1    Prenatal Vit-Fe Fumarate-FA (PRENATAL MULTIVITAMIN) TABS tablet Take 1 tablet by mouth daily at 12 noon.       Vitals:  BP 129/73 (BP Location: Left Arm)   Pulse 86   Temp 98.2 F (36.8 C) (Oral)   Resp 18   Ht 5\' 5"  (1.651 m)   Wt 102.1 kg   LMP 09/12/2023   BMI 37.44 kg/m  Physical Examination:  General appearance: alert, cooperative and no distress Abdomen: soft, non-tender; bowel sounds normal; no masses,  no organomegaly Pelvic: cervix normal in appearance, external genitalia normal, no adnexal masses or tenderness, no bladder tenderness, no cervical motion tenderness, rectovaginal septum normal, urethra without abnormality   Extremities: extremities normal, atraumatic, no cyanosis or edema  Membranes:intact WUJ:WJXB out preterm labor Baseline  FHR: 145 beats/min Variability: moderate Accelerations: present Decelerations: absent Tocometry: occassional  Interpretation:  INDICATIONS: patient reassurance, rule out uterine contractions, and hx of preterm labor RESULTS:  A NST procedure was performed with FHR monitoring and a normal baseline established, appropriate time of 20-40 minutes of evaluation, and accels >2 seen w 10x10 characteristics.  Results show a REACTIVE NST.    SVE: presentation cephalic, dilation fingertip, effacement 40%, station -2,  SSE: no pooling of fluid seen, vaginal discharge none, wet prep results: no pathogens and positive WBC's  Labs:  Results for orders placed or performed during the hospital encounter of 04/25/24 (from the past 24 hours)  Wet prep, genital   Collection Time:  04/25/24  4:07 PM   Specimen: Vaginal  Result Value Ref Range   Yeast Wet Prep HPF POC NONE SEEN NONE SEEN   Trich, Wet Prep NONE SEEN NONE SEEN   Clue Cells Wet Prep HPF POC NONE SEEN NONE SEEN   WBC, Wet Prep HPF POC >=10 (A) <10   Sperm NONE SEEN   Urinalysis, Routine w reflex microscopic -Urine, Clean Catch   Collection Time: 04/25/24  4:07 PM  Result Value Ref Range   Color, Urine YELLOW (A) YELLOW   APPearance HAZY (A) CLEAR   Specific Gravity, Urine 1.019 1.005 - 1.030   pH 6.0 5.0 - 8.0   Glucose, UA NEGATIVE NEGATIVE mg/dL   Hgb urine dipstick NEGATIVE NEGATIVE   Bilirubin Urine NEGATIVE NEGATIVE   Ketones, ur NEGATIVE NEGATIVE mg/dL   Protein, ur NEGATIVE NEGATIVE mg/dL   Nitrite NEGATIVE NEGATIVE   Leukocytes,Ua TRACE (A) NEGATIVE   RBC / HPF 0-5 0 - 5 RBC/hpf   WBC, UA 0-5 0 - 5 WBC/hpf   Bacteria, UA NONE SEEN NONE SEEN   Squamous Epithelial / HPF 6-10 0 - 5 /HPF  Rupture of Membrane (ROM) Plus   Collection Time: 04/25/24  4:07 PM  Result Value Ref Range   Rom Plus NEGATIVE   Fetal fibronectin   Collection Time: 04/25/24  4:07 PM  Result Value Ref Range   Fetal Fibronectin NEGATIVE NEGATIVE   Chlamydia/NGC rt PCR ([facility] only)   Collection Time: 04/25/24  5:59 PM   Specimen: Cervical/Vaginal swab  Result Value Ref Range   Specimen source GC/Chlam ENDOCERVICAL    Chlamydia Tr NOT DETECTED NOT DETECTED   N gonorrhoeae NOT DETECTED NOT DETECTED  Group B strep by PCR   Collection Time: 04/25/24  5:59 PM   Specimen: Cervical/Vaginal swab; Genital  Result Value Ref Range   Group B strep by PCR PRESUMPTIVE NEGATIVE PRESUMPTIVE NEGATIVE   Assessment and Plan:  A/P: 28yo G1P0  [redacted]w[redacted]d with evidence of preterm uterine contractions.cervical Dilation: Fingertip Effacement (%): Thick Exam by:: Calton Catholic CNM . ctx noted on monitor.  Concern for PTL. Non-threatening exam, but given the frequency and patient perception of pain with contractions at this early gestational age, will admit overnight for observation, labs, BMZ. No mag for neuroprotection as delivery not imminent. If  improved pain, and stable cervical exam, can d/c home tomorrow.   1. Cervical dilation, r/o cervical insufficiency, PTL -IV LR 1000 mL Fluid Bolus - BMZ 12mg  today and in 24hrs - growth sono tomorrow  2. Wet prep -GC/Chlamydia -Fetal Fibronectin  -ROM Plus -GBS PCR  Patient Active Problem List   Diagnosis Date Noted   Leakage of amniotic fluid 04/25/2024   Abdominal pain affecting pregnancy 03/04/2024   Supervision of high risk pregnancy in second trimester 12/08/2023   Reaction to cell-mediated gamma interferon antigen response test without active tuberculosis 09/22/2020   Vitamin D  deficiency 10/09/2019   Generalized anxiety disorder 05/02/2019   Major depressive disorder, recurrent episode, moderate (HCC) 05/02/2019   Anxiety and depression 04/23/2019   History of preterm delivery 02/18/2019   History of gestational hypertension 02/18/2019   Admit to Antenatal Terbutaline  0.25 mg SQ and pelvic rest advised Betamethasone  x 2 doses Magnesium  sulfate for CP prophylaxis if labor imminent. Will  recheck presentation if she does progress in preterm labor to determine route of delivery Continuous TOCO Daily NST Routine antenatal care -Discussed plan with Dr Chestine Costain CNM

## 2024-04-26 DIAGNOSIS — O4700 False labor before 37 completed weeks of gestation, unspecified trimester: Secondary | ICD-10-CM | POA: Diagnosis present

## 2024-04-26 MED ORDER — METOPROLOL TARTRATE 50 MG PO TABS
25.0000 mg | ORAL_TABLET | Freq: Two times a day (BID) | ORAL | Status: DC
  Administered 2024-04-26: 25 mg via ORAL
  Filled 2024-04-26: qty 0.5

## 2024-04-26 NOTE — Progress Notes (Signed)
 ANTEPARTUM PROGRESS NOTE  Brenda Fuentes is a 28 y.o. [phone] at [redacted]w[redacted]d who is admitted for preterm contractions.   Estimated Date of Delivery: 06/25/24  Length of Stay:  0 Days. Admitted 04/25/2024  Subjective: Feeling better than last night. Still notices contractions but not as intense as they were. Denies vaginal bleeding or LOF. Endorses good fetal movement.   Vitals:  BP 124/68   Pulse 91   Temp 98.4 F (36.9 C) (Oral)   Resp 15   Ht 5\' 5"  (1.651 m)   Wt 102.1 kg   LMP 09/12/2023   BMI 37.44 kg/m  Physical Examination: General:   alert, cooperative, and no distress  Skin:  normal  Neurologic:    Alert & oriented x 3  Lungs:   Normal respiratory effort   Abdomen:  Soft, non-tender, gravid  Pelvis:  Exam deferred.  Extremities: : non-tender, symmetric, no edema bilaterally.  DTRs: 2+/2+    NST: Baseline FHR: 130 beats/min Variability: moderate Accelerations: present Decelerations: absent Tocometry: Infrequent, mild contractions   Interpretation:  INDICATIONS: rule out uterine contractions RESULTS:  A NST procedure was performed with FHR monitoring and a normal baseline established, appropriate time of 20-40 minutes of evaluation, and accels >2 seen w 15x15 characteristics.  Results show a REACTIVE NST.    Results for orders placed or performed during the hospital encounter of 04/25/24 (from the past 48 hours)  Wet prep, genital     Status: Abnormal   Collection Time: 04/25/24  4:07 PM   Specimen: Vaginal  Result Value Ref Range   Yeast Wet Prep HPF POC NONE SEEN NONE SEEN   Trich, Wet Prep NONE SEEN NONE SEEN   Clue Cells Wet Prep HPF POC NONE SEEN NONE SEEN   WBC, Wet Prep HPF POC >=10 (A) <10   Sperm NONE SEEN     Comment: Performed at [facility], [address] [place], [place] [address]  Urinalysis, Routine w reflex microscopic -Urine, Clean Catch     Status: Abnormal   Collection Time: 04/25/24  4:07 PM  Result Value Ref Range   Color, Urine  YELLOW (A) YELLOW   APPearance HAZY (A) CLEAR   Specific Gravity, Urine 1.019 1.005 - 1.030   pH 6.0 5.0 - 8.0   Glucose, UA NEGATIVE NEGATIVE mg/dL   Hgb urine dipstick NEGATIVE NEGATIVE   Bilirubin Urine NEGATIVE NEGATIVE   Ketones, ur NEGATIVE NEGATIVE mg/dL   Protein, ur NEGATIVE NEGATIVE mg/dL   Nitrite NEGATIVE NEGATIVE   Leukocytes,Ua TRACE (A) NEGATIVE   RBC / HPF 0-5 0 - 5 RBC/hpf   WBC, UA 0-5 0 - 5 WBC/hpf   Bacteria, UA NONE SEEN NONE SEEN   Squamous Epithelial / HPF 6-10 0 - 5 /HPF    Comment: Performed at [facility], [address] [place], [place] [address]  Rupture of Membrane (ROM) Plus     Status: None   Collection Time: 04/25/24  4:07 PM  Result Value Ref Range   Rom Plus NEGATIVE     Comment: Performed at [facility], [address] [place], [place] [address]  Fetal fibronectin     Status: None   Collection Time: 04/25/24  4:07 PM  Result Value Ref Range   Fetal Fibronectin NEGATIVE NEGATIVE    Comment: Performed at [facility], [address] [place], [place] [address]  Chlamydia/NGC rt PCR [facility] only)     Status: None   Collection Time: 04/25/24  5:59  PM   Specimen: Cervical/Vaginal swab  Result Value Ref Range   Specimen source GC/Chlam ENDOCERVICAL    Chlamydia Tr NOT DETECTED NOT DETECTED   N gonorrhoeae NOT DETECTED NOT DETECTED    Comment: (NOTE) This CT/NG assay has not been evaluated in patients with a history of  hysterectomy. Performed at [facility], [address] [place], [place] [address]   Group B strep by PCR     Status: None   Collection Time: 04/25/24  5:59 PM   Specimen: Cervical/Vaginal swab; Genital  Result Value Ref Range   Group B strep by PCR PRESUMPTIVE NEGATIVE PRESUMPTIVE NEGATIVE    Comment: (NOTE) Intrapartum testing with Xpert Xpress GBS assay should be used as an adjunct to other methods available and not used to replace antepartum testing (at 35-[redacted] weeks  gestation).  A GBS PRESUMPTIVE NEGATIVE should be interpreted as: Patient may or may not be colonized with GBS. A GBS presumptive negative result does not exclude the possibility of GBS colonization. Providers should consider new risk factors, if applicable, and clinical guidance regarding a role for intrapartum prophylaxis.  Recommend culture for confirmation of GBS colonization is clinically indicated.  Performed at [facility], [address] [place], [place] [address]     No results found.  Current scheduled medications  betamethasone  acetate-betamethasone  sodium phosphate  12 mg Intramuscular Q24H   prenatal multivitamin  1 tablet Oral Q1200    I have reviewed the patient's current medications.  ASSESSMENT: Principal Problem:   Leakage of amniotic fluid    PLAN: High risk antepartum  -Observation for preterm contractions  -Dr Hans Lex MD notified of plan of care  -Continue routine antepartum care  -NST q shift -Regular diet -Activity: as tolerated   Preterm Contractions  -Betamethasone : completed on 04/26/2024 at 1730, next dose due today at 1730 -Mag sulfate infusion for neuro-protection: was not indicated d/t neg FFN and resolution of contractions  -NICU consult: not indicated - will place consult if concerns about progressing PTL  -Labs as follows:  -Wet prep positive WBC's  -GC/CT: Negative  -FFN: Negative  -GBS: Negative  -UA: unremarkable  -Presentation: TBD -Will recheck presentation if progresses in labor  -contractions have improved with rest and IV fluids  -Plan for discharge after 2nd betamethasone  this evening   Fraser Jackson, CNM Certified Nurse Midwife [name]  [facility]

## 2024-04-26 NOTE — Discharge Summary (Signed)
 Brenda Fuentes is a 28 y.o. female. She is at [redacted]w[redacted]d gestation. Patient's last menstrual period was 09/12/2023. 06/25/2024, Date entered prior to episode creation   Prenatal care site: [facility]  Chief complaint: abdominal pain and pelvic pressure   Admission Diagnoses:  1) intrauterine pregnancy at [redacted]w[redacted]d  2) Leakage of amniotic fluid [O42.90] 3) Preterm contractions   Discharge Diagnoses:  Principal Problem:   Leakage of amniotic fluid not found  Active Problems:   History of preterm delivery, currently pregnant   Preterm contractions   HPI: Brenda Fuentes presents to L&D with complaints of abdominal pain and pelvic pressure. The patient reports experiencing abdominal pain and pelvic pressure occurring every 2 to 3 minutes, which began around 14:20. She rates the pain as a 7 out of 10 on the pain scale. Additionally, she mentions having intermittent (LOF). Her pregnancy is complicated by multiple sclerosis, hx of PTD at 36 weeks, POTS, seizure disorder (no meds), history of GHTN, Anxiety and depression, obesity, and elevated early 1hr GTT. She denies continued loss of fluid or vaginal bleeding. Endorses fetal movement as active. She was monitored for 24 hours for contractions and given betamethasone  x 2.   S: Resting comfortably. no VB.no LOF,  Active fetal movement. Contractions feel less frequent and less intense.   Maternal Medical History:  Past Medical Hx:  has a past medical history of Anxiety, Depression (2016), Gestational hypertension, Hypercholesteremia (2009), Multiple sclerosis (HCC), POTS (postural orthostatic tachycardia syndrome), and TB lung, latent.    Past Surgical Hx:  has a past surgical history that includes No past surgeries.   Allergies  Allergen Reactions   Penicillins Hives     Prior to Admission medications   Medication Sig Start Date End Date Taking? Authorizing Provider  aspirin  81 MG chewable tablet Chew 81 mg by mouth daily.    [provider]  lamoTRIgine  (LAMICTAL ) 25 MG tablet Take 200 mg by mouth daily.    [provider]  metoprolol  tartrate (LOPRESSOR ) 25 MG tablet Take 1 tablet (25 mg total) by mouth 2 (two) times daily. 03/05/24 04/04/24  Liboon, Jazmine, CNM  Prenatal Vit-Fe Fumarate-FA (PRENATAL MULTIVITAMIN) TABS tablet Take 1 tablet by mouth daily at 12 noon.    [provider]    Social History: She  reports that she quit smoking about 5 years ago. Her smoking use included cigarettes. She has never used smokeless tobacco. She reports that she does not drink alcohol and does not use drugs.  Family History: family history includes Diabetes in her father; Heart failure in her father; Hyperlipidemia in her mother.   Review of Systems: A full review of systems was performed and negative except as noted in the HPI.     Pertinent Results:   O:  BP 125/75 (BP Location: Left Arm)   Pulse 81   Temp 98.2 F (36.8 C) (Oral)   Resp 18   Ht 5\' 5"  (1.651 m)   Wt 102.1 kg   LMP 09/12/2023   BMI 37.44 kg/m  No results found for this or any previous visit (from the past 48 hours).   No results found.  Constitutional: NAD, AAOx3  PULM: nl respiratory effort Abd: gravid, non-tender Ext: Non-tender, Nonedmeatous Psych: mood appropriate, speech normal SVE: Dilation: Fingertip Effacement (%): Thick Exam by:: A Rameses Ou CNM  -Unchanged after > 24 hours   NST: Baseline FHR: 130 beats/min Variability: moderate Accelerations: present Decelerations: absent Tocometry: Occasional mild contractions  Time: at least 20 minutes  Interpretation: Category I INDICATIONS: rule out uterine contractions RESULTS:  A NST procedure was performed with FHR monitoring and a normal baseline established, appropriate time of 20-40 minutes of evaluation, and accels >2 seen w 15x15 characteristics.  Results show a REACTIVE NST.   Consults: None  Procedures: NST, IV fluids, betamethasone    Hospital Course:  The patient was admitted to Labor and Delivery Triage for observation. Contractions became less frequent and intense after rest and IV fluids. She was observed for > 24 hours and received betamethasone  x 2. Cervical exam was unchanged. FFN, wet prep, and UA were negative. Strict PTL precautions were reviewed and when to return to L&D.  She was deemed stable for discharge and further outpatient management.   Discharge Condition: stable  Disposition: Discharge disposition: 01-Home or Self Care       Allergies as of 04/26/2024       Reactions   Penicillins Hives        Medication List     TAKE these medications    aspirin  81 MG chewable tablet Chew 81 mg by mouth daily.   lamoTRIgine  25 MG tablet Commonly known as: LAMICTAL  Take 200 mg by mouth daily.   metoprolol  tartrate 25 MG tablet Commonly known as: LOPRESSOR  Take 1 tablet (25 mg total) by mouth 2 (two) times daily.   prenatal multivitamin Tabs tablet Take 1 tablet by mouth daily at 12 noon.        Follow-up Information     [facility] Follow up on 05/02/2024.   Why: routine prenatal appointment Contact information: [address] [place]  [address] [phone]               ----- Teodora Fell, CNM  Certified Nurse Midwife [place]  [facility]

## 2024-04-26 NOTE — Discharge Instructions (Signed)

## 2024-04-26 NOTE — OB Triage Note (Signed)
 Discharge instructions provided to pt. Pt verbalizes understanding. Vaginal bleeding and discharge, contractions, and fetal movement reviewed by RN. Follow-up care reviewed. Pt discharged home by self.

## 2024-05-03 ENCOUNTER — Observation Stay
Admission: EM | Admit: 2024-05-03 | Discharge: 2024-05-04 | Disposition: A | Discharge status: Home / Self Care | Attending: Obstetrics and Gynecology | Admitting: Obstetrics and Gynecology

## 2024-05-03 ENCOUNTER — Other Ambulatory Visit: Payer: Self-pay

## 2024-05-03 ENCOUNTER — Observation Stay

## 2024-05-03 ENCOUNTER — Encounter: Payer: Self-pay | Admitting: Obstetrics and Gynecology

## 2024-05-03 DIAGNOSIS — Z79899 Other long term (current) drug therapy: Secondary | ICD-10-CM | POA: Diagnosis not present

## 2024-05-03 DIAGNOSIS — O98513 Other viral diseases complicating pregnancy, third trimester: Secondary | ICD-10-CM | POA: Diagnosis not present

## 2024-05-03 DIAGNOSIS — O4700 False labor before 37 completed weeks of gestation, unspecified trimester: Principal | ICD-10-CM

## 2024-05-03 DIAGNOSIS — O4703 False labor before 37 completed weeks of gestation, third trimester: Secondary | ICD-10-CM | POA: Diagnosis present

## 2024-05-03 DIAGNOSIS — U071 COVID-19: Secondary | ICD-10-CM | POA: Diagnosis not present

## 2024-05-03 DIAGNOSIS — Z7982 Long term (current) use of aspirin: Secondary | ICD-10-CM | POA: Diagnosis not present

## 2024-05-03 DIAGNOSIS — Z87891 Personal history of nicotine dependence: Secondary | ICD-10-CM | POA: Diagnosis not present

## 2024-05-03 DIAGNOSIS — Z3A32 32 weeks gestation of pregnancy: Secondary | ICD-10-CM | POA: Diagnosis not present

## 2024-05-03 LAB — WET PREP, GENITAL
Clue Cells Wet Prep HPF POC: NONE SEEN
Sperm: NONE SEEN
Trich, Wet Prep: NONE SEEN
WBC, Wet Prep HPF POC: <10 (ref <10)
Yeast Wet Prep HPF POC: NONE SEEN

## 2024-05-03 MED ORDER — NIFEDIPINE 10 MG PO CAPS
10.0000 mg | ORAL_CAPSULE | Freq: Four times a day (QID) | ORAL | Status: DC
  Administered 2024-05-04: 10 mg via ORAL
  Filled 2024-05-03: qty 1

## 2024-05-03 MED ORDER — METOPROLOL TARTRATE 50 MG PO TABS
25.0000 mg | ORAL_TABLET | Freq: Two times a day (BID) | ORAL | Status: DC
  Administered 2024-05-03 – 2024-05-04 (×2): 25 mg via ORAL
  Filled 2024-05-03: qty 0.5

## 2024-05-03 MED ORDER — TERBUTALINE SULFATE 1 MG/ML IJ SOLN
0.2500 mg | INTRAMUSCULAR | Status: DC | PRN
Start: 2024-05-03 — End: 2024-05-03
  Administered 2024-05-03: 0.25 mg via SUBCUTANEOUS

## 2024-05-03 MED ORDER — ASPIRIN 81 MG PO CHEW
81.0000 mg | CHEWABLE_TABLET | Freq: Every day | ORAL | Status: DC
  Administered 2024-05-04: 81 mg via ORAL
  Filled 2024-05-03: qty 1

## 2024-05-03 MED ORDER — NIFEDIPINE 10 MG PO CAPS
30.0000 mg | ORAL_CAPSULE | Freq: Once | ORAL | Status: AC
  Administered 2024-05-03: 30 mg via ORAL
  Filled 2024-05-03: qty 3

## 2024-05-03 MED ORDER — METOPROLOL TARTRATE 50 MG PO TABS
25.0000 mg | ORAL_TABLET | Freq: Two times a day (BID) | ORAL | Status: DC
  Filled 2024-05-03: qty 0.5

## 2024-05-03 MED ORDER — LACTATED RINGERS IV SOLN: INTRAVENOUS | Status: DC

## 2024-05-03 MED ORDER — LACTATED RINGERS IV BOLUS
500.0000 mL | Freq: Once | INTRAVENOUS | Status: AC
  Administered 2024-05-03: 500 mL via INTRAVENOUS

## 2024-05-03 MED ORDER — LAMOTRIGINE 100 MG PO TABS
200.0000 mg | ORAL_TABLET | Freq: Every day | ORAL | Status: DC
  Administered 2024-05-03: 200 mg via ORAL
  Filled 2024-05-03: qty 2

## 2024-05-03 MED ORDER — PRENATAL MULTIVITAMIN CH: 1.0000 | ORAL_TABLET | Freq: Every day | ORAL | Status: DC

## 2024-05-03 MED ORDER — TERBUTALINE SULFATE 1 MG/ML IJ SOLN
0.2500 mg | INTRAMUSCULAR | Status: DC | PRN
  Filled 2024-05-03: qty 1

## 2024-05-03 NOTE — OB Triage Note (Signed)
 Pt reports to labor and delivery with complaints of contractions that started about 1 hour ago. They are consistent and close together - approximately 2-3 minutes apart. She came about 1 week ago for the same complaint. During that stay, she received betamethasone  and terb.   Denies vaginal bleeding and LOF. States positive fetal movement.   Denies recent intercourse   Hx of COVID during pregnancy.  Hx of POTTS and MS.  EFM and Toco applied and assessing.

## 2024-05-03 NOTE — H&P (Signed)
 HISTORY AND PHYSICAL NOTE  History of Present Illness: Brenda Fuentes is a 28 y.o. [phone] at [redacted]w[redacted]d seen for Preterm labor.  She presented to L&D with complaints of contractions starting up again this am.  She was seen for PTL 5/15-5/16 where she received 2 doses of BMZ, terb x1 dose, and IVF  Reports active fetal movement  Contractions: every 3 minutes LOF/SROM: Intact Vaginal bleeding: none Fetal presentation is unsure.  Factors complicating pregnancy:  Anemia POTS - diagnosed 03/05/24 Non Epileptic Seizure Disorder Elevated BP in pregnancy  Hx of Gestational HTN  H/o mental health diagnoses: Anxiety and Depression  Multiple Sclerosis  Obesity BMI: 35.8 H/o Migraines  Former tobacco use in pregnancy  Hx of PTD  COVID during pregnancy   Prenatal care site:  [facility]  Patient Active Problem List   Diagnosis Date Noted   Preterm contractions 04/26/2024   Leakage of amniotic fluid 04/25/2024   Abdominal pain affecting pregnancy 03/04/2024   Supervision of high risk pregnancy in second trimester 12/08/2023   Reaction to cell-mediated gamma interferon antigen response test without active tuberculosis 09/22/2020   Vitamin D  deficiency 10/09/2019   Generalized anxiety disorder 05/02/2019   Major depressive disorder, recurrent episode, moderate (HCC) 05/02/2019   Anxiety and depression 04/23/2019   History of preterm delivery, currently pregnant 02/18/2019   History of gestational hypertension 02/18/2019    Past Medical History:  Diagnosis Date   Anxiety    Depression 2016   Gestational hypertension    Hypercholesteremia 2009   Multiple sclerosis (HCC)    POTS (postural orthostatic tachycardia syndrome)    TB lung, latent     Past Surgical History:  Procedure Laterality Date   NO PAST SURGERIES      OB History  Gravida Para Term Preterm AB Living  4 2 1 1 1 2   SAB IAB Ectopic Multiple Live Births  1   0 2    # Outcome Date GA Lbr Len/2nd  Weight Sex Type Anes PTL Lv  4 Current           3 SAB 07/2023 [redacted]w[redacted]d         2 Term 08/20/19 [redacted]w[redacted]d 04:36 / 00:18 3010 g F Vag-Spont EPI  LIV  1 Preterm 07/04/16 [redacted]w[redacted]d 14:25 / 00:23 2560 g M Vag-Spont EPI  LIV    Social History:  reports that she quit smoking about 5 years ago. Her smoking use included cigarettes. She has never used smokeless tobacco. She reports that she does not drink alcohol and does not use drugs.  Family History: family history includes Diabetes in her father; Heart failure in her father; Hyperlipidemia in her mother.  Allergies  Allergen Reactions   Penicillins Hives    Medications Prior to Admission  Medication Sig Dispense Refill Last Dose/Taking   aspirin  81 MG chewable tablet Chew 81 mg by mouth daily.      lamoTRIgine  (LAMICTAL ) 25 MG tablet Take 200 mg by mouth daily.      metoprolol  tartrate (LOPRESSOR ) 25 MG tablet Take 1 tablet (25 mg total) by mouth 2 (two) times daily. 60 tablet 1    Prenatal Vit-Fe Fumarate-FA (PRENATAL MULTIVITAMIN) TABS tablet Take 1 tablet by mouth daily at 12 noon.         Physical Examination: Vitals:  BP 131/67 (BP Location: Left Arm)   Pulse 83   Temp 98.3 F (36.8 C) (Oral)   Resp 19   Ht 5\' 6"  (1.676 m)   Wt  103.4 kg   LMP 09/12/2023   BMI 36.80 kg/m  General: no acute distress.  HEENT: normocephalic, atraumatic Heart: regular rate & rhythm Lungs: normal respiratory effort Abdomen: soft, gravid, non-tender;  EFW:  1,515  g       81%     3 lb 5  oz  Pelvic:   External: Normal external female genitalia  Cervix: Dilation: Closed / Effacement (%): Thick / Station: -3    Extremities: non-tender, symmetric Neurologic: Alert & oriented x 3.    Membranes: intact FHT:  FHR: 140 bpm, variability: minimal ,  accelerations:  Abscent,  decelerations:  Absent Category/reactivity:  Category II UC:   regular, every 3 minutes  Labs:  No results found for this or any previous visit (from the past 24 hours).  Prenatal  Labs: Blood type/Rh B pos  Antibody screen neg  Rubella Immune  Varicella Immune  RPR NR  HBsAg Neg  Hep C NR  HIV NR  GC neg  Chlamydia neg  Genetic screening cfDNA negative   1 hour GTT 140  3 hour GTT 96, 150, 102, 86  GBS 5/15 GBS presumptive neg    Imaging Studies: No results found.  Assessment and Plan: Patient Active Problem List   Diagnosis Date Noted   Preterm contractions 04/26/2024   Leakage of amniotic fluid 04/25/2024   Abdominal pain affecting pregnancy 03/04/2024   Supervision of high risk pregnancy in second trimester 12/08/2023   Reaction to cell-mediated gamma interferon antigen response test without active tuberculosis 09/22/2020   Vitamin D  deficiency 10/09/2019   Generalized anxiety disorder 05/02/2019   Major depressive disorder, recurrent episode, moderate (HCC) 05/02/2019   Anxiety and depression 04/23/2019   History of preterm delivery, currently pregnant 02/18/2019   History of gestational hypertension 02/18/2019    1. Admit to Antenatal - Dr. Cleora Daft notified of admission and status - IVF - U/s for presentation - Terb - Wet prep  Aron Lard, CNM  Certified Nurse Midwife [facility]  Clinic OB/GYN [facility]

## 2024-05-04 DIAGNOSIS — O4703 False labor before 37 completed weeks of gestation, third trimester: Secondary | ICD-10-CM | POA: Diagnosis not present

## 2024-05-04 MED ORDER — NIFEDIPINE 10 MG PO CAPS: 10.0000 mg | ORAL_CAPSULE | Freq: Four times a day (QID) | ORAL | 1 refills | Status: DC | PRN

## 2024-05-04 NOTE — OB Triage Note (Signed)
 Patient given discharge instructions as per AVS. All questions answered. Patient verbalized understanding. Discharged in stable condition, ambulatory.

## 2024-05-04 NOTE — Discharge Instructions (Signed)

## 2024-05-04 NOTE — Discharge Summary (Signed)
 Patient ID: Brenda Fuentes MRN: [id number] DOB/AGE: 12-29-1995 28 y.o.  Admit date: 05/03/2024 Discharge date: 05/04/2024  Admission Diagnoses: 28 yo G4P2 at [redacted]w[redacted]d presents with preterm uterine contractions.   Discharge Diagnoses: No cervical change and UCs respond well to procardia tocolysis  Factors complicating pregnancy: Anemia POTS - diagnosed 03/05/24 Non Epileptic Seizure Disorder Elevated BP in pregnancy  Hx of Gestational HTN  H/o mental health diagnoses: Anxiety and Depression  Multiple Sclerosis  Obesity BMI: 35.8 H/o Migraines  Former tobacco use in pregnancy  Hx of PTD  COVID during pregnancy  Prenatal Procedures: NST and ultrasound  Consults: None  Significant Diagnostic Studies:  Results for orders placed or performed during the hospital encounter of 05/03/24 (from the past week)  Wet prep, genital   Collection Time: 05/03/24 12:51 PM  Result Value Ref Range   Yeast Wet Prep HPF POC NONE SEEN NONE SEEN   Trich, Wet Prep NONE SEEN NONE SEEN   Clue Cells Wet Prep HPF POC NONE SEEN NONE SEEN   WBC, Wet Prep HPF POC <10 <10   Sperm NONE SEEN     Treatments: IV hydration and tocolysis  Hospital Course:  This is a 28 y.o. [id number] with IUP at [redacted]w[redacted]d was observed overnight for preterm labor, noted to have a cervical exam of Closed/Thick/High.  No leaking of fluid and no bleeding.  Her tocolysis was transitioned to Procardia. She was able to sleep through the night with no reported UCs.  UCs then began again around 0600, but responded well to procardia. Overall, she was observed, fetal heart rate monitoring remained reassuring, and she had no signs/symptoms of  active preterm labor or other maternal-fetal concerns.  Her cervical exam was unchanged from admission.  She was deemed stable for discharge to home with outpatient follow up.  Discharge Physical Exam:  BP 131/73 (BP Location: Left Arm)   Pulse 76   Temp 97.8 F (36.6 C) (Oral)   Resp 18   Ht 5\' 6"   (1.676 m)   Wt 103.4 kg   LMP 09/12/2023   BMI 36.80 kg/m   General: NAD CV: RRR Pulm: nl effort ABD: s/nd/nt, gravid DVT Evaluation: LE non-ttp, no evidence of DVT on exam.  NST:  Prior to discharge FHR baseline: 140 bpm Variability: moderate Accelerations: yes Decelerations: none Category/reactivity: reactive  TOCO: 7-8 min, mild  SVE:  Dilation: Closed Effacement (%): Thick Station: -3 Exam by:: Jelani Vreeland CNM   Discharge Condition: Stable  Disposition: Discharge disposition: 01-Home or Self Care        Allergies as of 05/04/2024       Reactions   Penicillins Hives        Medication List     TAKE these medications    aspirin  81 MG chewable tablet Chew 81 mg by mouth daily.   lamoTRIgine  25 MG tablet Commonly known as: LAMICTAL  Take 200 mg by mouth daily.   metoprolol  tartrate 25 MG tablet Commonly known as: LOPRESSOR  Take 1 tablet (25 mg total) by mouth 2 (two) times daily.   NIFEdipine 10 MG capsule Commonly known as: PROCARDIA Take 1 capsule (10 mg total) by mouth every 6 (six) hours as needed.   prenatal multivitamin Tabs tablet Take 1 tablet by mouth daily at 12 noon.        Follow-up Information     [facility] Follow up.   Why: Keep all scheduled appointments If contractions continue, you may need to be seen earlier Contact information: [address] [place]  [address] [phone]                Signed:  Collin Deal, CNM 05/04/2024 9:26 AM

## 2024-05-09 ENCOUNTER — Encounter: Payer: Self-pay | Admitting: Obstetrics and Gynecology

## 2024-05-09 ENCOUNTER — Other Ambulatory Visit: Payer: Self-pay

## 2024-05-09 ENCOUNTER — Observation Stay
Admission: EM | Admit: 2024-05-09 | Discharge: 2024-05-09 | Disposition: A | Discharge status: Home / Self Care | Attending: Obstetrics and Gynecology | Admitting: Obstetrics and Gynecology

## 2024-05-09 DIAGNOSIS — O4700 False labor before 37 completed weeks of gestation, unspecified trimester: Principal | ICD-10-CM

## 2024-05-09 DIAGNOSIS — Z3A33 33 weeks gestation of pregnancy: Secondary | ICD-10-CM | POA: Diagnosis not present

## 2024-05-09 DIAGNOSIS — A084 Viral intestinal infection, unspecified: Secondary | ICD-10-CM | POA: Diagnosis present

## 2024-05-09 DIAGNOSIS — R110 Nausea: Secondary | ICD-10-CM | POA: Insufficient documentation

## 2024-05-09 DIAGNOSIS — E860 Dehydration: Secondary | ICD-10-CM | POA: Diagnosis not present

## 2024-05-09 DIAGNOSIS — U071 COVID-19: Secondary | ICD-10-CM | POA: Insufficient documentation

## 2024-05-09 DIAGNOSIS — O26893 Other specified pregnancy related conditions, third trimester: Principal | ICD-10-CM | POA: Insufficient documentation

## 2024-05-09 DIAGNOSIS — O98513 Other viral diseases complicating pregnancy, third trimester: Secondary | ICD-10-CM | POA: Diagnosis not present

## 2024-05-09 LAB — CBC
HCT: 34.2 % — ABNORMAL LOW (ref 36.0–46.0)
Hemoglobin: 11 g/dL — ABNORMAL LOW (ref 12.0–15.0)
MCH: 24.6 pg — ABNORMAL LOW (ref 26.0–34.0)
MCHC: 32.2 g/dL (ref 30.0–36.0)
MCV: 76.3 fL — ABNORMAL LOW (ref 80.0–100.0)
Platelets: 310 10*3/uL (ref 150–400)
RBC: 4.48 MIL/uL (ref 3.87–5.11)
RDW: 16.3 % — ABNORMAL HIGH (ref 11.5–15.5)
WBC: 9.9 10*3/uL (ref 4.0–10.5)
nRBC: 0 % (ref 0.0–0.2)

## 2024-05-09 LAB — URINALYSIS, COMPLETE (UACMP) WITH MICROSCOPIC
Bilirubin Urine: NEGATIVE
Glucose, UA: NEGATIVE mg/dL
Hgb urine dipstick: NEGATIVE
Ketones, ur: 20 mg/dL — AB
Leukocytes,Ua: NEGATIVE
Nitrite: NEGATIVE
Protein, ur: NEGATIVE mg/dL
Specific Gravity, Urine: 1.027 (ref 1.005–1.030)
pH: 5 (ref 5.0–8.0)

## 2024-05-09 LAB — COMPREHENSIVE METABOLIC PANEL WITH GFR
ALT: 25 U/L (ref 0–44)
AST: 26 U/L (ref 15–41)
Albumin: 2.9 g/dL — ABNORMAL LOW (ref 3.5–5.0)
Alkaline Phosphatase: 208 U/L — ABNORMAL HIGH (ref 38–126)
Anion gap: 4 — ABNORMAL LOW (ref 5–15)
BUN: 10 mg/dL (ref 6–20)
CO2: 25 mmol/L (ref 22–32)
Calcium: 8.2 mg/dL — ABNORMAL LOW (ref 8.9–10.3)
Chloride: 107 mmol/L (ref 98–111)
Creatinine, Ser: 0.46 mg/dL (ref 0.44–1.00)
GFR, Estimated: >60 mL/min (ref >60)
Glucose, Bld: 118 mg/dL — ABNORMAL HIGH (ref 70–99)
Potassium: 3.3 mmol/L — ABNORMAL LOW (ref 3.5–5.1)
Sodium: 136 mmol/L (ref 135–145)
Total Bilirubin: 0.7 mg/dL (ref 0.0–1.2)
Total Protein: 7.1 g/dL (ref 6.5–8.1)

## 2024-05-09 MED ORDER — ONDANSETRON 4 MG PO TBDP: 4.0000 mg | ORAL_TABLET | Freq: Three times a day (TID) | ORAL | 0 refills | Status: DC | PRN

## 2024-05-09 MED ORDER — ONDANSETRON HCL 4 MG/2ML IJ SOLN: 4.0000 mg | Freq: Four times a day (QID) | INTRAMUSCULAR | Status: DC | PRN

## 2024-05-09 MED ORDER — LACTATED RINGERS IV BOLUS
1000.0000 mL | Freq: Once | INTRAVENOUS | Status: AC
  Administered 2024-05-09: 1000 mL via INTRAVENOUS

## 2024-05-09 MED ORDER — SODIUM CHLORIDE 0.9 % IV SOLN: INTRAVENOUS | Status: DC | PRN

## 2024-05-09 MED ORDER — ONDANSETRON HCL 4 MG/2ML IJ SOLN
INTRAMUSCULAR | Status: AC
  Administered 2024-05-09: 4 mg via INTRAVENOUS
  Filled 2024-05-09: qty 2

## 2024-05-09 MED ORDER — POTASSIUM CHLORIDE 10 MEQ/100ML IV SOLN
10.0000 meq | INTRAVENOUS | Status: AC
  Administered 2024-05-09 (×2): 10 meq via INTRAVENOUS
  Filled 2024-05-09 (×2): qty 100

## 2024-05-09 MED ORDER — LACTATED RINGERS IV SOLN: INTRAVENOUS | Status: DC

## 2024-05-09 NOTE — Progress Notes (Signed)
 Pt presents to L/D triage with reported nausea, vomiting, and diarrhea that began last night. She reports that her children have had the same symptoms. Pt reports inability to keep down PO fluids. She also reports constant upper abdominal pain that is aching/sharp. Pt reports no bleeding/LOF and positive fetal movement.  Monitors applied and assessing. Initial FHT 160. VSS.  CNM notified of patient's arrival.  IV fluid bolus initiated. Labs and UA collected.

## 2024-05-09 NOTE — Discharge Summary (Signed)
 Patient ID: TYAH ACORD MRN: [id number] DOB/AGE: 1996/04/30 28 y.o.  Admit date: 05/09/2024 Discharge date: 05/09/2024  Admission Diagnoses: 28 yo G4P2 at [redacted]w[redacted]d presents with N/V, other members of her family are also sick with similar symptoms.   Discharge Diagnoses: Improved symptoms of dehydration and nausea, K+ replacement completed  Factors complicating pregnancy: Anemia POTS - diagnosed 03/05/24 Non Epileptic Seizure Disorder Elevated BP in pregnancy  Hx of Gestational HTN  H/o mental health diagnoses: Anxiety and Depression  Multiple Sclerosis  Obesity BMI: 35.8 H/o Migraines  Former tobacco use in pregnancy  Hx of PTD  COVID during pregnancy  Prenatal Procedures: NST   Consults: None  Significant Diagnostic Studies:  Results for orders placed or performed during the hospital encounter of 05/09/24 (from the past week)  CBC   Collection Time: 05/09/24  8:55 AM  Result Value Ref Range   WBC 9.9 4.0 - 10.5 K/uL   RBC 4.48 3.87 - 5.11 MIL/uL   Hemoglobin 11.0 (L) 12.0 - 15.0 g/dL   HCT 40.9 (L) 81.1 - 91.4 %   MCV 76.3 (L) 80.0 - 100.0 fL   MCH 24.6 (L) 26.0 - 34.0 pg   MCHC 32.2 30.0 - 36.0 g/dL   RDW 78.2 (H) 95.6 - 21.3 %   Platelets 310 150 - 400 K/uL   nRBC 0.0 0.0 - 0.2 %  Urinalysis, Complete w Microscopic -Urine, Clean Catch   Collection Time: 05/09/24  8:55 AM  Result Value Ref Range   Color, Urine YELLOW (A) YELLOW   APPearance HAZY (A) CLEAR   Specific Gravity, Urine 1.027 1.005 - 1.030   pH 5.0 5.0 - 8.0   Glucose, UA NEGATIVE NEGATIVE mg/dL   Hgb urine dipstick NEGATIVE NEGATIVE   Bilirubin Urine NEGATIVE NEGATIVE   Ketones, ur 20 (A) NEGATIVE mg/dL   Protein, ur NEGATIVE NEGATIVE mg/dL   Nitrite NEGATIVE NEGATIVE   Leukocytes,Ua NEGATIVE NEGATIVE   RBC / HPF 0-5 0 - 5 RBC/hpf   WBC, UA 0-5 0 - 5 WBC/hpf   Bacteria, UA RARE (A) NONE SEEN   Squamous Epithelial / HPF 6-10 0 - 5 /HPF   Mucus PRESENT   Comprehensive metabolic panel    Collection Time: 05/09/24  8:55 AM  Result Value Ref Range   Sodium 136 135 - 145 mmol/L   Potassium 3.3 (L) 3.5 - 5.1 mmol/L   Chloride 107 98 - 111 mmol/L   CO2 25 22 - 32 mmol/L   Glucose, Bld 118 (H) 70 - 99 mg/dL   BUN 10 6 - 20 mg/dL   Creatinine, Ser 0.86 0.44 - 1.00 mg/dL   Calcium  8.2 (L) 8.9 - 10.3 mg/dL   Total Protein 7.1 6.5 - 8.1 g/dL   Albumin 2.9 (L) 3.5 - 5.0 g/dL   AST 26 15 - 41 U/L   ALT 25 0 - 44 U/L   Alkaline Phosphatase 208 (H) 38 - 126 U/L   Total Bilirubin 0.7 0.0 - 1.2 mg/dL   GFR, Estimated >57 >84 mL/min   Anion gap 4 (L) 5 - 15    Treatments: IV hydration and antiemetic  Hospital Course:  This is a 28 y.o. [id number] with IUP at [redacted]w[redacted]d was seen for N/V and severe dehydration.  Given IV hydration and Zofran  with significant improvement.  K+ was 3.3 and she was given 2 doses of K+.   Overall, she was observed, fetal heart rate monitoring remained reassuring, and she had no signs/symptoms of preterm labor or  other maternal-fetal concerns.  She was deemed stable for discharge to home with outpatient follow up.  Discharge Physical Exam:  Ht 5\' 6"  (1.676 m)   Wt 102.1 kg   LMP 09/12/2023   BMI 36.32 kg/m    NST:   FHR baseline: 140 bpm Variability: moderate Accelerations: yes Decelerations: none Category/reactivity: reactive  TOCO: occasional  SVE: deferred     Discharge Condition: Stable  Disposition:  Discharge disposition: 01-Home or Self Care        Allergies as of 05/09/2024       Reactions   Penicillins Hives        Medication List     TAKE these medications    aspirin  81 MG chewable tablet Chew 81 mg by mouth daily.   lamoTRIgine  25 MG tablet Commonly known as: LAMICTAL  Take 200 mg by mouth daily.   metoprolol  tartrate 25 MG tablet Commonly known as: LOPRESSOR  Take 1 tablet (25 mg total) by mouth 2 (two) times daily.   NIFEdipine 10 MG capsule Commonly known as: PROCARDIA Take 1 capsule (10 mg total) by mouth  every 6 (six) hours as needed.   ondansetron  4 MG disintegrating tablet Commonly known as: ZOFRAN -ODT Take 1 tablet (4 mg total) by mouth every 8 (eight) hours as needed for nausea or vomiting.   prenatal multivitamin Tabs tablet Take 1 tablet by mouth daily at 12 noon.         Follow-up Information     [facility] Follow up.   Why: Keep all scheduled appointments Contact information: [address] [place]  [address] [phone]                 Signed:  Delores Fester 05/09/2024 5:38 PM

## 2024-05-09 NOTE — OB Triage Note (Signed)
 Pt discharged home per order. Pt able to tolerate PO fluids. No further episodes of vomiting or diarrhea since arrival to unit. Pt reports decrease in nausea.    Pt stable and ambulatory and an After Visit Summary was printed and given to the patient. Discharge education completed with patient/family including follow up instructions, appointments, and medication list. Pt received labor  precautions. Patient able to verbalize understanding, all questions fully answered upon discharge.

## 2024-05-11 ENCOUNTER — Observation Stay

## 2024-05-11 ENCOUNTER — Observation Stay
Admission: EM | Admit: 2024-05-11 | Discharge: 2024-05-12 | Disposition: A | Discharge status: Home / Self Care | Attending: Obstetrics and Gynecology | Admitting: Obstetrics and Gynecology

## 2024-05-11 DIAGNOSIS — Z3A33 33 weeks gestation of pregnancy: Secondary | ICD-10-CM | POA: Insufficient documentation

## 2024-05-11 DIAGNOSIS — Z7982 Long term (current) use of aspirin: Secondary | ICD-10-CM | POA: Diagnosis not present

## 2024-05-11 DIAGNOSIS — O99283 Endocrine, nutritional and metabolic diseases complicating pregnancy, third trimester: Secondary | ICD-10-CM | POA: Insufficient documentation

## 2024-05-11 DIAGNOSIS — R109 Unspecified abdominal pain: Secondary | ICD-10-CM | POA: Insufficient documentation

## 2024-05-11 DIAGNOSIS — O99213 Obesity complicating pregnancy, third trimester: Secondary | ICD-10-CM | POA: Insufficient documentation

## 2024-05-11 DIAGNOSIS — O4700 False labor before 37 completed weeks of gestation, unspecified trimester: Secondary | ICD-10-CM | POA: Diagnosis present

## 2024-05-11 DIAGNOSIS — E669 Obesity, unspecified: Secondary | ICD-10-CM | POA: Insufficient documentation

## 2024-05-11 DIAGNOSIS — R197 Diarrhea, unspecified: Secondary | ICD-10-CM

## 2024-05-11 DIAGNOSIS — O26893 Other specified pregnancy related conditions, third trimester: Secondary | ICD-10-CM | POA: Insufficient documentation

## 2024-05-11 DIAGNOSIS — Z87891 Personal history of nicotine dependence: Secondary | ICD-10-CM | POA: Diagnosis not present

## 2024-05-11 DIAGNOSIS — O98513 Other viral diseases complicating pregnancy, third trimester: Secondary | ICD-10-CM | POA: Insufficient documentation

## 2024-05-11 DIAGNOSIS — U071 COVID-19: Secondary | ICD-10-CM | POA: Insufficient documentation

## 2024-05-11 DIAGNOSIS — E876 Hypokalemia: Secondary | ICD-10-CM

## 2024-05-11 DIAGNOSIS — A084 Viral intestinal infection, unspecified: Secondary | ICD-10-CM | POA: Diagnosis present

## 2024-05-11 DIAGNOSIS — Z79899 Other long term (current) drug therapy: Secondary | ICD-10-CM | POA: Diagnosis not present

## 2024-05-11 DIAGNOSIS — R1110 Vomiting, unspecified: Principal | ICD-10-CM | POA: Diagnosis present

## 2024-05-11 DIAGNOSIS — O4703 False labor before 37 completed weeks of gestation, third trimester: Secondary | ICD-10-CM | POA: Diagnosis present

## 2024-05-11 LAB — URINALYSIS, ROUTINE W REFLEX MICROSCOPIC
Bilirubin Urine: NEGATIVE
Glucose, UA: NEGATIVE mg/dL
Hgb urine dipstick: NEGATIVE
Ketones, ur: NEGATIVE mg/dL
Leukocytes,Ua: NEGATIVE
Nitrite: NEGATIVE
Protein, ur: NEGATIVE mg/dL
Specific Gravity, Urine: 1.006 (ref 1.005–1.030)
pH: 6 (ref 5.0–8.0)

## 2024-05-11 LAB — COMPREHENSIVE METABOLIC PANEL WITH GFR
ALT: 31 U/L (ref 0–44)
AST: 25 U/L (ref 15–41)
Albumin: 2.7 g/dL — ABNORMAL LOW (ref 3.5–5.0)
Alkaline Phosphatase: 233 U/L — ABNORMAL HIGH (ref 38–126)
Anion gap: 9 (ref 5–15)
BUN: 8 mg/dL (ref 6–20)
CO2: 20 mmol/L — ABNORMAL LOW (ref 22–32)
Calcium: 8.1 mg/dL — ABNORMAL LOW (ref 8.9–10.3)
Chloride: 108 mmol/L (ref 98–111)
Creatinine, Ser: 0.5 mg/dL (ref 0.44–1.00)
GFR, Estimated: >60 mL/min (ref >60)
Glucose, Bld: 128 mg/dL — ABNORMAL HIGH (ref 70–99)
Potassium: 2.7 mmol/L — CL (ref 3.5–5.1)
Sodium: 137 mmol/L (ref 135–145)
Total Bilirubin: 0.4 mg/dL (ref 0.0–1.2)
Total Protein: 6.2 g/dL — ABNORMAL LOW (ref 6.5–8.1)

## 2024-05-11 LAB — WET PREP, GENITAL
Clue Cells Wet Prep HPF POC: NONE SEEN
Sperm: NONE SEEN
Trich, Wet Prep: NONE SEEN
WBC, Wet Prep HPF POC: <10 (ref <10)
Yeast Wet Prep HPF POC: NONE SEEN

## 2024-05-11 LAB — FETAL FIBRONECTIN: Fetal Fibronectin: NEGATIVE

## 2024-05-11 MED ORDER — POTASSIUM CHLORIDE 10 MEQ/100ML IV SOLN
10.0000 meq | INTRAVENOUS | Status: AC
  Administered 2024-05-12 (×4): 10 meq via INTRAVENOUS
  Filled 2024-05-11 (×4): qty 100

## 2024-05-11 MED ORDER — LACTATED RINGERS IV BOLUS
1000.0000 mL | Freq: Once | INTRAVENOUS | Status: AC
  Administered 2024-05-11: 1000 mL via INTRAVENOUS

## 2024-05-11 MED ORDER — ZOLPIDEM TARTRATE 5 MG PO TABS: 5.0000 mg | ORAL_TABLET | Freq: Every evening | ORAL | Status: DC | PRN

## 2024-05-11 MED ORDER — ALUM & MAG HYDROXIDE-SIMETH 200-200-20 MG/5ML PO SUSP
30.0000 mL | Freq: Once | ORAL | Status: DC
  Filled 2024-05-11: qty 30

## 2024-05-11 MED ORDER — TERBUTALINE SULFATE 1 MG/ML IJ SOLN
INTRAMUSCULAR | Status: AC
  Filled 2024-05-11: qty 1

## 2024-05-11 MED ORDER — ACETAMINOPHEN 325 MG PO TABS
650.0000 mg | ORAL_TABLET | ORAL | Status: DC | PRN
  Filled 2024-05-11: qty 2

## 2024-05-11 MED ORDER — CALCIUM CARBONATE ANTACID 500 MG PO CHEW: 2.0000 | CHEWABLE_TABLET | ORAL | Status: DC | PRN

## 2024-05-11 NOTE — OB Triage Note (Signed)
 Brenda Fuentes 28 y.o. @G4P2  @33w4dGA   presents to Labor & Delivery triage via wheelchair steered by ED staff reporting ctx every . . She denies signs and symptoms consistent with rupture of membranes or active vaginal bleeding. She states positive fetal movement. External FM and TOCO applied to abdomen. Initial FHR 145. Vital signs obtained and within normal limits. Patient oriented to care environment including call bell and bed control use. Lavanda Porter, CNM notified of patient's arrival.

## 2024-05-12 DIAGNOSIS — O4703 False labor before 37 completed weeks of gestation, third trimester: Secondary | ICD-10-CM | POA: Diagnosis not present

## 2024-05-12 DIAGNOSIS — R197 Diarrhea, unspecified: Secondary | ICD-10-CM

## 2024-05-12 DIAGNOSIS — E876 Hypokalemia: Secondary | ICD-10-CM

## 2024-05-12 LAB — COMPREHENSIVE METABOLIC PANEL WITH GFR
ALT: 28 U/L (ref 0–44)
ALT: 28 U/L (ref 0–44)
AST: 19 U/L (ref 15–41)
AST: 19 U/L (ref 15–41)
Albumin: 2.3 g/dL — ABNORMAL LOW (ref 3.5–5.0)
Albumin: 2.5 g/dL — ABNORMAL LOW (ref 3.5–5.0)
Alkaline Phosphatase: 197 U/L — ABNORMAL HIGH (ref 38–126)
Alkaline Phosphatase: 210 U/L — ABNORMAL HIGH (ref 38–126)
Anion gap: 6 (ref 5–15)
Anion gap: 6 (ref 5–15)
BUN: 6 mg/dL (ref 6–20)
BUN: <5 mg/dL — ABNORMAL LOW (ref 6–20)
CO2: 22 mmol/L (ref 22–32)
CO2: 21 mmol/L — ABNORMAL LOW (ref 22–32)
Calcium: 8 mg/dL — ABNORMAL LOW (ref 8.9–10.3)
Calcium: 8 mg/dL — ABNORMAL LOW (ref 8.9–10.3)
Chloride: 109 mmol/L (ref 98–111)
Chloride: 111 mmol/L (ref 98–111)
Creatinine, Ser: 0.47 mg/dL (ref 0.44–1.00)
Creatinine, Ser: 0.4 mg/dL — ABNORMAL LOW (ref 0.44–1.00)
GFR, Estimated: >60 mL/min (ref >60)
GFR, Estimated: >60 mL/min (ref >60)
Glucose, Bld: 86 mg/dL (ref 70–99)
Glucose, Bld: 88 mg/dL (ref 70–99)
Potassium: 3.8 mmol/L (ref 3.5–5.1)
Potassium: 3.8 mmol/L (ref 3.5–5.1)
Sodium: 137 mmol/L (ref 135–145)
Sodium: 138 mmol/L (ref 135–145)
Total Bilirubin: 0.3 mg/dL (ref 0.0–1.2)
Total Bilirubin: 0.5 mg/dL (ref 0.0–1.2)
Total Protein: 5.3 g/dL — ABNORMAL LOW (ref 6.5–8.1)
Total Protein: 5.6 g/dL — ABNORMAL LOW (ref 6.5–8.1)

## 2024-05-12 LAB — MAGNESIUM
Magnesium: 1.6 mg/dL — ABNORMAL LOW (ref 1.7–2.4)
Magnesium: 2.2 mg/dL (ref 1.7–2.4)

## 2024-05-12 MED ORDER — LACTATED RINGERS IV SOLN: INTRAVENOUS | Status: DC

## 2024-05-12 MED ORDER — NIFEDIPINE 10 MG PO CAPS: 10.0000 mg | ORAL_CAPSULE | Freq: Four times a day (QID) | ORAL | Status: DC | PRN

## 2024-05-12 MED ORDER — MAGNESIUM SULFATE 2 GM/50ML IV SOLN
2.0000 g | Freq: Once | INTRAVENOUS | Status: AC
  Administered 2024-05-12: 2 g via INTRAVENOUS
  Filled 2024-05-12: qty 50

## 2024-05-12 MED ORDER — NIFEDIPINE 10 MG PO CAPS: 10.0000 mg | ORAL_CAPSULE | Freq: Three times a day (TID) | ORAL | Status: DC | PRN

## 2024-05-12 MED ORDER — GUAIFENESIN 100 MG/5ML PO LIQD
5.0000 mL | ORAL | Status: DC | PRN
  Filled 2024-05-12: qty 5

## 2024-05-12 NOTE — Discharge Summary (Addendum)
 Discharge Summary   Brenda Fuentes is a 28 y.o. female. She is at [redacted]w[redacted]d gestation. Patient's last menstrual period was 09/12/2023. Estimated Date of Delivery: 06/25/24  Prenatal care site: [facility]   Current pregnancy complicated by:  Anemia POTS - diagnosed 03/05/24 Non Epileptic Seizure Disorder Elevated BP in pregnancy  Hx of Gestational HTN  H/o mental health diagnoses: Anxiety and Depression  Multiple Sclerosis  Obesity BMI: 35.8 H/o Migraines  Former tobacco use in pregnancy  Hx of PTD  COVID during pregnancy  Admission Diagnosis: diarrhea, preterm uterine contractions, hypokalemia, hypomagnesemia   Discharge Diagnosis: high risk pregnancy in 3rd trimester  S: Resting comfortably. no CTX, no VB.no LOF,  Active fetal movement.  Denies: HA, visual changes, SOB, or RUQ/epigastric pain  Maternal Medical History:   Past Medical History:  Diagnosis Date   Anxiety    Depression 2016   Gestational hypertension    Hypercholesteremia 2009   Multiple sclerosis (HCC)    POTS (postural orthostatic tachycardia syndrome)    TB lung, latent     Past Surgical History:  Procedure Laterality Date   NO PAST SURGERIES      Allergies  Allergen Reactions   Penicillins Hives    Prior to Admission medications   Medication Sig Start Date End Date Taking? Authorizing Provider  aspirin  81 MG chewable tablet Chew 81 mg by mouth daily.   Yes [provider]  lamoTRIgine  (LAMICTAL ) 25 MG tablet Take 200 mg by mouth daily.   Yes [provider]  metoprolol  tartrate (LOPRESSOR ) 25 MG tablet Take 1 tablet (25 mg total) by mouth 2 (two) times daily. 03/05/24 05/11/24 Yes Liboon, Jazmine, CNM  Prenatal Vit-Fe Fumarate-FA (PRENATAL MULTIVITAMIN) TABS tablet Take 1 tablet by mouth daily at 12 noon.   Yes [provider]  NIFEdipine  (PROCARDIA ) 10 MG capsule Take 1 capsule (10 mg total) by mouth every 6 (six) hours as needed. 05/04/24   Collin Deal,  CNM  ondansetron  (ZOFRAN -ODT) 4 MG disintegrating tablet Take 1 tablet (4 mg total) by mouth every 8 (eight) hours as needed for nausea or vomiting. 05/09/24   Collin Deal, CNM    Social History: She  reports that she quit smoking about 5 years ago. Her smoking use included cigarettes. She has never used smokeless tobacco. She reports that she does not drink alcohol and does not use drugs.  Family History: family history includes Diabetes in her father; Heart failure in her father; Hyperlipidemia in her mother.  no history of gyn cancers  Review of Systems: A full review of systems was performed and negative except as noted in the HPI.    O:  BP 127/76   Pulse 66   Temp 98.4 F (36.9 C) (Oral)   Resp (!) 22   Ht 5\' 6"  (1.676 m)   LMP 09/12/2023   SpO2 98%   BMI 36.32 kg/m  Vitals:   05/12/24 0930 05/12/24 1000  BP:    Pulse: 72 66  Resp: 20 (!) 22  Temp:    SpO2: 99% 98%    Results for orders placed or performed during the hospital encounter of 05/11/24 (from the past 48 hours)  Urinalysis, Routine w reflex microscopic -Urine, Clean Catch   Collection Time: 05/11/24  9:50 PM  Result Value Ref Range   Color, Urine YELLOW (A) YELLOW   APPearance CLEAR (A) CLEAR   Specific Gravity, Urine 1.006 1.005 - 1.030   pH 6.0 5.0 - 8.0   Glucose,  UA NEGATIVE NEGATIVE mg/dL   Hgb urine dipstick NEGATIVE NEGATIVE   Bilirubin Urine NEGATIVE NEGATIVE   Ketones, ur NEGATIVE NEGATIVE mg/dL   Protein, ur NEGATIVE NEGATIVE mg/dL   Nitrite NEGATIVE NEGATIVE   Leukocytes,Ua NEGATIVE NEGATIVE  Fetal fibronectin   Collection Time: 05/11/24 10:14 PM  Result Value Ref Range   Fetal Fibronectin NEGATIVE NEGATIVE  Wet prep, genital   Collection Time: 05/11/24 10:15 PM  Result Value Ref Range   Yeast Wet Prep HPF POC NONE SEEN NONE SEEN   Trich, Wet Prep NONE SEEN NONE SEEN   Clue Cells Wet Prep HPF POC NONE SEEN NONE SEEN   WBC, Wet Prep HPF POC <10 <10   Sperm NONE SEEN    Comprehensive metabolic panel   Collection Time: 05/11/24 11:03 PM  Result Value Ref Range   Sodium 137 135 - 145 mmol/L   Potassium 2.7 (LL) 3.5 - 5.1 mmol/L   Chloride 108 98 - 111 mmol/L   CO2 20 (L) 22 - 32 mmol/L   Glucose, Bld 128 (H) 70 - 99 mg/dL   BUN 8 6 - 20 mg/dL   Creatinine, Ser 0.45 0.44 - 1.00 mg/dL   Calcium  8.1 (L) 8.9 - 10.3 mg/dL   Total Protein 6.2 (L) 6.5 - 8.1 g/dL   Albumin 2.7 (L) 3.5 - 5.0 g/dL   AST 25 15 - 41 U/L   ALT 31 0 - 44 U/L   Alkaline Phosphatase 233 (H) 38 - 126 U/L   Total Bilirubin 0.4 0.0 - 1.2 mg/dL   GFR, Estimated >40 >98 mL/min   Anion gap 9 5 - 15  Comprehensive metabolic panel   Collection Time: 05/12/24  4:03 AM  Result Value Ref Range   Sodium 137 135 - 145 mmol/L   Potassium 3.8 3.5 - 5.1 mmol/L   Chloride 109 98 - 111 mmol/L   CO2 22 22 - 32 mmol/L   Glucose, Bld 86 70 - 99 mg/dL   BUN 6 6 - 20 mg/dL   Creatinine, Ser 1.19 0.44 - 1.00 mg/dL   Calcium  8.0 (L) 8.9 - 10.3 mg/dL   Total Protein 5.3 (L) 6.5 - 8.1 g/dL   Albumin 2.3 (L) 3.5 - 5.0 g/dL   AST 19 15 - 41 U/L   ALT 28 0 - 44 U/L   Alkaline Phosphatase 197 (H) 38 - 126 U/L   Total Bilirubin 0.3 0.0 - 1.2 mg/dL   GFR, Estimated >14 >78 mL/min   Anion gap 6 5 - 15  Magnesium    Collection Time: 05/12/24  4:03 AM  Result Value Ref Range   Magnesium  1.6 (L) 1.7 - 2.4 mg/dL  Comprehensive metabolic panel with GFR   Collection Time: 05/12/24  9:19 AM  Result Value Ref Range   Sodium 138 135 - 145 mmol/L   Potassium 3.8 3.5 - 5.1 mmol/L   Chloride 111 98 - 111 mmol/L   CO2 21 (L) 22 - 32 mmol/L   Glucose, Bld 88 70 - 99 mg/dL   BUN <5 (L) 6 - 20 mg/dL   Creatinine, Ser 2.95 (L) 0.44 - 1.00 mg/dL   Calcium  8.0 (L) 8.9 - 10.3 mg/dL   Total Protein 5.6 (L) 6.5 - 8.1 g/dL   Albumin 2.5 (L) 3.5 - 5.0 g/dL   AST 19 15 - 41 U/L   ALT 28 0 - 44 U/L   Alkaline Phosphatase 210 (H) 38 - 126 U/L   Total Bilirubin 0.5 0.0 - 1.2  mg/dL   GFR, Estimated >78 >29 mL/min    Anion gap 6 5 - 15  Magnesium    Collection Time: 05/12/24  9:19 AM  Result Value Ref Range   Magnesium  2.2 1.7 - 2.4 mg/dL     Constitutional: NAD, AAOx3  HE/ENT: extraocular movements grossly intact, moist mucous membranes CV: RRR PULM: nl respiratory effort, CTABL     Abd: gravid, non-tender, non-distended, soft      Ext: Non-tender, Nonedematous   Psych: mood appropriate, speech normal Pelvic: closed/thick/-3  Pelvic exam: normal external genitalia, vulva, vagina, cervix, uterus and adnexa.  Fetal  monitoring: Cat 1 Appropriate for GA Baseline: 145bpm Variability: moderate Accelerations: present x >2 Decelerations absent Uterine contractions: occasional, >10 min apart Time 13hrs  A/P: 28 y.o. [redacted]w[redacted]d here for antenatal surveillance for preterm uterine contractions, diarrhea, hypokalemia , and hypomagnesemia   Principle Diagnosis:  High risk pregnancy in third trimester  Preterm Labor: not present. Contractions became infrequent and less intense with IV hydration, terbutaline , and rest. FFN negative. Cervical length 4.52cm and cervix unchanged from last 3 admissions. Advised to take her Procardia  10mg  IR q8hrs PRN contractions at home. She verbalized understanding.  Hypokalemia: potassium 2.7 > K+ IV > 3.8 > 3.8. Telemetry monitoring reassuring. No need for outpatient oral supplementation at this time. If begins to vomit or experience diarrhea again, return for evaluation of CMP. Follow-up in the office in 3 days for outpatient CMP.  Diarrhea: none present since she presented to L&D. If begins again, continue oral hydration and notify CNM. Hypomagnesemia: magnesium  1.6 > 2g magnesium  IV per pharmacy recommendation > 2.2. Stable for discharge.  Fetal Wellbeing: Reassuring Cat 1 tracing. Reactive NST  D/c home stable, precautions reviewed, follow-up as scheduled.    Auston Left, CNM 05/12/2024 10:26 AM

## 2024-05-12 NOTE — Progress Notes (Signed)
 PHARMACY CONSULT NOTE  Pharmacy Consult for Electrolyte Monitoring and Replacement   Recent Labs: Potassium (mmol/L)  Date Value  05/12/2024 3.8  07/23/2014 3.8   Magnesium  (mg/dL)  Date Value  10/08/2535 1.6 (L)   Calcium  (mg/dL)  Date Value  64/40/3474 8.0 (L)   Calcium , Total (mg/dL)  Date Value  25/95/6387 8.9 (L)   Albumin (g/dL)  Date Value  56/43/3295 2.3 (L)  12/30/2020 4.5   Sodium (mmol/L)  Date Value  05/12/2024 137  05/08/2019 142  07/23/2014 138    Assessment: Pt is 28 yo pregnant female admitted for preterm uterine contractions.  Pharmacy consulted to assist with electrolyte monitoring and replacement as indicated.  Goal of Therapy:  Electrolytes within normal limits  Plan:  --Mag 1.6, Ordered Mag Sulfate 2 gm IV x 1 --Recheck labs tomorrow AM. --Pharmacy will continue to follow along  Brenda Fuentes, PharmD, MBA 05/12/2024 5:30 AM

## 2024-05-12 NOTE — H&P (Addendum)
 Brenda Fuentes is a 28 y.o. female. She is at [redacted]w[redacted]d gestation. Patient's last menstrual period was 09/12/2023. Estimated Date of Delivery: 06/25/24  Prenatal care site: [facility]  Current pregnancy complicated by:   Anemia POTS - diagnosed 03/05/24 Non Epileptic Seizure Disorder Elevated BP in pregnancy  Hx of Gestational HTN  H/o mental health diagnoses: Anxiety and Depression  Multiple Sclerosis  Obesity BMI: 35.8 H/o Migraines  Former tobacco use in pregnancy  Hx of PTD  COVID during pregnancy  Chief complaint: uterine cramping, abdominal pain  She reports painful uterine contractions this evening, rectal pressure, and abdominal pain. She has been seen 04/25/24, 05/03/24, and 05/09/24 for preterm uterine contractions and her exam has been reassuring and stable. She was discharged with Procardia  10mg  IR PO for preterm contractions and took that this evening but her contractions did not space out. She reports they were q3-32min. She also reports frequent diarrhea today after having a stomach virus with n/v/d on 05/09/24. She denies any vomiting at this time. Reports good fetal movement, denies vaginal bleeding.  S: Resting comfortably. no CTX, no VB.no LOF,  Active fetal movement.  Denies: HA, visual changes, SOB, or RUQ/epigastric pain  Maternal Medical History:   Past Medical History:  Diagnosis Date   Anxiety    Depression 2016   Gestational hypertension    Hypercholesteremia 2009   Multiple sclerosis (HCC)    POTS (postural orthostatic tachycardia syndrome)    TB lung, latent     Past Surgical History:  Procedure Laterality Date   NO PAST SURGERIES      Allergies  Allergen Reactions   Penicillins Hives    Prior to Admission medications   Medication Sig Start Date End Date Taking? Authorizing Provider  aspirin  81 MG chewable tablet Chew 81 mg by mouth daily.   Yes [provider]  lamoTRIgine  (LAMICTAL ) 25 MG tablet Take 200 mg by mouth  daily.   Yes [provider]  metoprolol  tartrate (LOPRESSOR ) 25 MG tablet Take 1 tablet (25 mg total) by mouth 2 (two) times daily. 03/05/24 05/11/24 Yes Liboon, Jazmine, CNM  Prenatal Vit-Fe Fumarate-FA (PRENATAL MULTIVITAMIN) TABS tablet Take 1 tablet by mouth daily at 12 noon.   Yes [provider]  NIFEdipine  (PROCARDIA ) 10 MG capsule Take 1 capsule (10 mg total) by mouth every 6 (six) hours as needed. 05/04/24   Collin Deal, CNM  ondansetron  (ZOFRAN -ODT) 4 MG disintegrating tablet Take 1 tablet (4 mg total) by mouth every 8 (eight) hours as needed for nausea or vomiting. 05/09/24   Collin Deal, CNM    Social History: She  reports that she quit smoking about 5 years ago. Her smoking use included cigarettes. She has never used smokeless tobacco. She reports that she does not drink alcohol and does not use drugs.  Family History: family history includes Diabetes in her father; Heart failure in her father; Hyperlipidemia in her mother.  no history of gyn cancers  Review of Systems: A full review of systems was performed and negative except as noted in the HPI.     O:  BP 122/74   Pulse 90   Temp 98 F (36.7 C) (Oral)   Resp (!) 24   LMP 09/12/2023   SpO2 98%  Results for orders placed or performed during the hospital encounter of 05/11/24 (from the past 48 hours)  Urinalysis, Routine w reflex microscopic -Urine, Clean Catch   Collection Time: 05/11/24  9:50 PM  Result Value Ref Range  Color, Urine YELLOW (A) YELLOW   APPearance CLEAR (A) CLEAR   Specific Gravity, Urine 1.006 1.005 - 1.030   pH 6.0 5.0 - 8.0   Glucose, UA NEGATIVE NEGATIVE mg/dL   Hgb urine dipstick NEGATIVE NEGATIVE   Bilirubin Urine NEGATIVE NEGATIVE   Ketones, ur NEGATIVE NEGATIVE mg/dL   Protein, ur NEGATIVE NEGATIVE mg/dL   Nitrite NEGATIVE NEGATIVE   Leukocytes,Ua NEGATIVE NEGATIVE  Fetal fibronectin   Collection Time: 05/11/24 10:14 PM  Result Value Ref Range   Fetal Fibronectin  NEGATIVE NEGATIVE  Wet prep, genital   Collection Time: 05/11/24 10:15 PM  Result Value Ref Range   Yeast Wet Prep HPF POC NONE SEEN NONE SEEN   Trich, Wet Prep NONE SEEN NONE SEEN   Clue Cells Wet Prep HPF POC NONE SEEN NONE SEEN   WBC, Wet Prep HPF POC <10 <10   Sperm NONE SEEN   Comprehensive metabolic panel   Collection Time: 05/11/24 11:03 PM  Result Value Ref Range   Sodium 137 135 - 145 mmol/L   Potassium 2.7 (LL) 3.5 - 5.1 mmol/L   Chloride 108 98 - 111 mmol/L   CO2 20 (L) 22 - 32 mmol/L   Glucose, Bld 128 (H) 70 - 99 mg/dL   BUN 8 6 - 20 mg/dL   Creatinine, Ser 9.56 0.44 - 1.00 mg/dL   Calcium  8.1 (L) 8.9 - 10.3 mg/dL   Total Protein 6.2 (L) 6.5 - 8.1 g/dL   Albumin 2.7 (L) 3.5 - 5.0 g/dL   AST 25 15 - 41 U/L   ALT 31 0 - 44 U/L   Alkaline Phosphatase 233 (H) 38 - 126 U/L   Total Bilirubin 0.4 0.0 - 1.2 mg/dL   GFR, Estimated >21 >30 mL/min   Anion gap 9 5 - 15     Constitutional: NAD, AAOx3  HE/ENT: extraocular movements grossly intact, moist mucous membranes CV: RRR PULM: nl respiratory effort, CTABL     Abd: gravid, non-tender, non-distended, soft      Ext: Non-tender, Nonedematous   Psych: mood appropriate, speech normal Pelvic: closed/thick/-3  Pelvic exam: normal external genitalia, vulva, vagina, cervix, uterus and adnexa.  Fetal  monitoring: Cat 1 Appropriate for GA Baseline: 140bpm Variability: moderate Accelerations: absent/ present x >2 Decelerations absent Contractions: initially q3-25min, now occasional and intermittent  EXAM: LIMITED OBSTETRIC ULTRASOUND AND TRANSVAGINAL OBSTETRIC ULTRASOUND   FINDINGS: Number of Fetuses: 1   Heart Rate:  158 bpm   Movement: Yes   Presentation: Cephalic   Placental Location: Anterior   Previa: No   Amniotic Fluid (Subjective): Within normal limits.   AFI: 12.2 cm   BPD:  8.32cm 33w 3d   MATERNAL FINDINGS:   Cervix:  Appears closed.   Uterus/Adnexae:  No abnormality visualized.    IMPRESSION: 1. Single live intrauterine gestation.  No acute abnormality.  A/P: 28 y.o. [redacted]w[redacted]d here for antenatal surveillance for preterm uterine contractions, diarrhea, and hypokalemia  Principle Diagnosis:  Preterm uterine contractions, diarrhea, hypokalemia  Preterm Labor: not present. Her cervical exam is stable from her last three admissions. Her contractions decreased after terbutaline . She received 1L LR bolus d/t dehydration from diarrhea. Cervical length noted to be 4.52cm by TVUS. FFN negative.  Diarrhea: diarrhea caused her hypokalemia. Ordered 1L LR bolus followed by LR 181ml/hr. No diarrhea present at this time. Hypokalemia: potassium 2.7. On 05/09/24 her potassium was 3.3 and she received 2 doses of K+. Cardiac telemetry monitor placed and Potassium 40mEq IV (10mEq x 4)  ordered. Repeat CMP in 4hrs to evaluate for continued potassium replacement. Check magnesium  level. Fetal Wellbeing: Reassuring Cat 1 tracing. Continuous fetal monitoring. AFI 12.2cm. Continuous telemetry and continuous fetal monitoring at this time. Regular diet, activity as tolerated.   Auston Left, CNM 05/12/2024 1:19 AM

## 2024-05-12 NOTE — OB Triage Note (Signed)
 Discharge instructions, labor precautions, and follow-up care reviewed with patient and significant other. All questions answered. Patient verbalized understanding. Discharged ambulatory off unit.

## 2024-05-21 NOTE — Progress Notes (Signed)
 Obstetrics & Gynecology Office Visit  Subjective  Brenda Fuentes is a 28 y.o. H5E8887 at [redacted]w[redacted]d being seen today for ongoing prenatal care.  She is currently monitored for this high-risk pregnancy.  Patient's last menstrual period was 09/12/2023 (exact date). Estimated Date of Delivery: 06/25/24  Patient concerns today: No concerns.   Objective  LMP 09/12/2023 (Exact Date)    Pre-pregnant weight: 102.5 kg (226 lb)  TWG: -0.816 kg (-1 lb 12.8 oz)  Gen: NAD  Pulm: No use of accessory muscles, normal respirations Abdomen: Gravid, nontender Ext : No edema, no rashes.   Psych: Mood, insight, judgement intact SVE: Deferred    US : Second/Third Trimester Ultrasound (05/21/2024) EFW: 2, 272 g, 5 lbs 0oz, 17% AFI: Normal. MVP: 5.6 cm, AFI: 15.3 cm FHT: 135  bpm Presentation: Cephalic Placenta: Anterior  Cervical Length: 3.0 cm BPP 8/8 Abdominal Circumference: 14%  Assessment   28 y.o. H5E8887 at [redacted]w[redacted]d by  06/25/2024, by Ultrasound presenting for routine prenatal visit  Plan   Problem list reviewed and/or updated  1. Supervision of high risk pregnancy in third trimester (HHS-HCC) - Fetal kick counts and preterm labor precautions reviewed.  - GBS:=Presumptive Negative (04/25/2024) - Desires early IOL (per neurologist) due to multiple sclerosis.  - US  results discussed with patient.   2. Obesity in pregnancy, antepartum (HHS-HCC) - BMI: 36.99 - TWG: 1.452 kg (3 lb 3.2 oz)  - TWGE: 5 kg (11 lb)-9 kg (19 lb)   3. COVID-19 affecting pregnancy in second trimester (HHS-HCC) - Weekly NST scheduled @ 36 weeks .    Return in about 1 week (around 05/28/2024) for Routine Prenatal.   Attestation Statement:   I personally performed the service, non-incident to. (WP)   JAZMINE SIMONE LIBOON, CNM

## 2024-05-28 LAB — OB RESULTS CONSOLE GBS: GBS: NEGATIVE

## 2024-05-28 LAB — OB RESULTS CONSOLE GC/CHLAMYDIA
Chlamydia: NEGATIVE
Neisseria Gonorrhea: NEGATIVE

## 2024-05-28 NOTE — Progress Notes (Signed)
 ROUTINE OB VISIT  Brenda Fuentes 28 y.o. H5E8887 [redacted]w[redacted]d.  Factors complicating this pregnancy: -Anemia 03/25/24 hgb 10.7 - encouraged to supplement 04/09/24 - hgb 10.7 POTS - diagnosed 03/05/24 Meds: Metoprolol  25 mg BID  Non Epileptic Seizure Disorder Medications prior to pregnancy: none Triggers: Stress, Anxiety Uterine Size-Dates Discrepancy 03/07/24: measuring 28cm @ 24wks, growth US  ordered next visit- scheduled 04/08/24 Elevated BP in pregnancy  Hx of Gestational HTN  ASA - discussed need, patient is aware 03/11/2024 - BP's mostly WNL, occasional mild range at home, PreE labs WNL  Continue to monitor BP's at home  03/25/24 PIH labs WNL H/o mental health diagnoses: Anxiety and Depression  12/12/23 NOB EPDS 16 with a 1 on #10 Medications prior to pregnancy: Abilify 5mg ; Lamictal  25mg  off for about a year prior to pregnancy Medications during pregnancy: Pt would like to discuss medications with MFM - as of 03/25/24 Has not seen MFM - they tried reaching out to her and she did not pick up Elevated early 1hr GTT Passed 3hr GTT Does not want to repeat 3hr GTT @ 28wks, will check blood sugar QID (fasting and 2hr postprandial) for 1wk  Multiple Sclerosis  Medications prior to pregnancy: Kesimpta Pen 20mg /0.21mL Q28 days - last dose in October; Lyrica 25mg  TID - stopped in ~ Sept/Oct Refer to MFM place 12/14/23 - as of 03/25/24 Has not seen MFM - they tried reaching out to her and she did not pick up Neurology recs: No preferred mode of delivery in the setting of MS No contraindication to epidural anesthesia Recommend resuming ofatumumab within 2 to 4 weeks of delivery.  Obesity BMI: 35.8 Baseline labs:  Early 1 hour GTT:  140   3hr GTT: 96, 150, 102, 86 wnl                P/C ratio: 107   CMP: wnl              A1c: 5.3 H/o Migraines  Medications prior to pregnancy: Rizatriptan 10mg  QD PRN - last dose in ~ October Medications during pregnancy: none Former tobacco use in pregnancy  Last time  was the end October Hx of PTD   36 weeks - first pregnancy COVID during pregnancy Which trimester? 3rd (04/03/24) Growth u/s in 3rd Trimester Weekly NSTs at 36 wks   S: No CTX, LOF, VB, DF. Denies HA, CP, SOB, NVCD, dysuria, abnormal discharge.  O: BP 123/82   Pulse 94   Ht 167.6 cm (5' 6)   Wt (!) 104.1 kg (229 lb 6.4 oz)   LMP 09/12/2023 (Exact Date)   BMI 37.03 kg/m   Abdomen: Gravid, nontender Ext : no edema, no rashes  SVE closed/thick/high  Brenda Fuentes is a 69 y.n.H5E8887 at [redacted]w[redacted]d  Baseline: 155 Variability: Moderate Accelerations: 2 15x15 bpm Decelerations: None  Brenda Fuentes 05/28/2024   Fetal heart tones: 150 distinguished from mom  A/P (Problem list updated and notes commented) IUP at [redacted]w[redacted]d - 36wk cultures collected today  Preterm labor precautions and warning s/s reviewed  FKC's daily  RTC in 1  weeks  Brenda JOHNATHAN PENTON, MD

## 2024-06-04 ENCOUNTER — Encounter: Payer: Self-pay | Admitting: Obstetrics and Gynecology

## 2024-06-04 ENCOUNTER — Observation Stay
Admission: EM | Admit: 2024-06-04 | Discharge: 2024-06-05 | Disposition: A | Discharge status: Home / Self Care | Attending: Obstetrics and Gynecology | Admitting: Obstetrics and Gynecology

## 2024-06-04 ENCOUNTER — Other Ambulatory Visit: Payer: Self-pay

## 2024-06-04 DIAGNOSIS — O98513 Other viral diseases complicating pregnancy, third trimester: Secondary | ICD-10-CM | POA: Insufficient documentation

## 2024-06-04 DIAGNOSIS — O471 False labor at or after 37 completed weeks of gestation: Secondary | ICD-10-CM | POA: Diagnosis present

## 2024-06-04 DIAGNOSIS — U071 COVID-19: Secondary | ICD-10-CM | POA: Diagnosis not present

## 2024-06-04 DIAGNOSIS — Z3A37 37 weeks gestation of pregnancy: Secondary | ICD-10-CM | POA: Insufficient documentation

## 2024-06-04 DIAGNOSIS — O368190 Decreased fetal movements, unspecified trimester, not applicable or unspecified: Principal | ICD-10-CM | POA: Diagnosis present

## 2024-06-04 NOTE — OB Triage Note (Signed)
 Pt is G4P2 at 37 week swith c/o UCs all day. Had visit today and cervix was 2cm at approx 4pm. Denies complications in pregnancy. +GBS FHR 140

## 2024-06-04 NOTE — Progress Notes (Signed)
 Patient's last menstrual period was 09/12/2023 (exact date). Estimated Date of Delivery: 06/25/24  28 y.o. H5E8887 at [redacted]w[redacted]d  The primary encounter diagnosis was Supervision of high risk pregnancy in third trimester (HHS-HCC). Diagnoses of Obesity in pregnancy, antepartum (HHS-HCC), Anxiety and depression, POTS (postural orthostatic tachycardia syndrome), Anemia affecting pregnancy in third trimester (HHS-HCC), MS (multiple sclerosis) (CMS/HHS-HCC), and COVID-19 affecting pregnancy, antepartum (HHS-HCC) were also pertinent to this visit.  S:   Patient concerns today:  - questions about GBS positive  Chief Complaint  Patient presents with  . Routine Prenatal Visit  . Non-stress Test    Reports: good fetal movement, occasional contractions  Denies bleeding, leaking fluid.   O:   See [facility] flowsheets. BP 133/87   Pulse 82   Ht 167.6 cm (5' 6)   Wt (!) 104.4 kg (230 lb 3.2 oz)   LMP 09/12/2023 (Exact Date)   BMI 37.16 kg/m  Gen: NAD  Pulm: No use of accessory muscles, normal respirations Abdomen: Gravid, nontender Pelvic: SVE 2/50/-3, medium, posterior Ext : mild edema, no rashes Psych: Mood, insight, judgement intact  NST: for covid in pregnancy Baseline: 140 bpm Variability: moderate Accels: >2 15x15  Decels: none Time: 20 min Category 1 NST reactive  A/P:  28 y.o. H5E8887 at [redacted]w[redacted]d   - Problem list reviewed and/or updated.  1. Supervision of high risk pregnancy in third trimester (HHS-HCC) GBS positive, treat in labor. Allergic to PCN Labor plans reviewed; planning any and everything for pain control, epidural. Kick counts reviewed with the patient in detail.  Patient instructed to assess for fetal activity daily at regular intervals.  Counts to be done if decreased activity noted.  Patient instructed to contact the office if counts do not reveal adequate movements. Labor precautions (ROM, vaginal bleeding, s/s labor) reviewed.  Pt verbalized  understanding.  2. Obesity in pregnancy, antepartum (HHS-HCC) TWG: 1.905 kg (4 lb 3.2 oz) Body mass index is 37.16 kg/m.  3. Anxiety and depression No medications in pregnancy  4. POTS (postural orthostatic tachycardia syndrome) HR today 82  5. Anemia affecting pregnancy in third trimester (HHS-HCC) Hgb 10.5, encouraged to continue supplementation  6. MS (multiple sclerosis) (CMS/HHS-HCC) No medications in pregnancy No contraindication to epidural anesthesia Resume ofatumumab within 2-4wks of delivery  7. COVID-19 affecting pregnancy, antepartum (HHS-HCC) Weekly NSTs until delivery    Call with bleeding, contractions, PROM, or decreased fetal movement.   Return in about 1 week (around 06/11/2024) for routine Prenatal, NST.   I personally performed the service, non-incident to. [facility])   EDSEL CHARLIES BLUSH , CNM 06/04/2024 4:08 PM

## 2024-06-05 DIAGNOSIS — O471 False labor at or after 37 completed weeks of gestation: Secondary | ICD-10-CM | POA: Diagnosis not present

## 2024-06-05 MED ORDER — HYDROXYZINE HCL 25 MG PO TABS
50.0000 mg | ORAL_TABLET | Freq: Once | ORAL | Status: AC
  Administered 2024-06-05: 50 mg via ORAL
  Filled 2024-06-05 (×2): qty 2

## 2024-06-05 NOTE — Discharge Summary (Signed)
 Patient ID: Brenda Fuentes MRN: [id number] DOB/AGE: Jul 02, 1996 28 y.o.  Admit date: 06/04/2024 Discharge date: 06/05/2024  Admission Diagnoses: Uterine contractions  Discharge Diagnoses: Early labor  Factors complicating pregnancy: Anemia POTS - diagnosed 03/05/24 GBS positive Non Epileptic Seizure Disorder Hx of Gestational HTN  H/o mental health diagnoses: Anxiety and Depression  Multiple Sclerosis  Obesity BMI: 35.8 H/o Migraines  Former tobacco use in pregnancy  Hx of PTD  COVID during pregnancy   Prenatal Procedures: NST  Consults: None  Significant Diagnostic Studies:  No results found for this or any previous visit (from the past week).  Treatments: none  Hospital Course:  This is a 28 y.o. H5E8887 with IUP at [redacted]w[redacted]d seen for a labor check, noted to have a cervical exam of 1.5/50/-3.  No leaking of fluid and no bleeding. She was observed, fetal heart rate monitoring remained reassuring, and she had no signs/symptoms of progressing labor or other maternal-fetal concerns.  Her cervical exam was unchanged from admission.  She was deemed stable for discharge to home with outpatient follow up.  Discharge Physical Exam:  Temp 98 F (36.7 C) (Oral)   Resp 16   Ht 5' 6 (1.676 m)   Wt 103 kg   LMP 09/12/2023   BMI 36.65 kg/m   General: NAD CV: RRR Pulm: nl effort ABD: s/nd/nt, gravid DVT Evaluation: LE non-ttp, no evidence of DVT on exam.  NST: FHR baseline: 145 bpm Variability: moderate Accelerations: yes Decelerations: none Category/reactivity: reactive   TOCO: quiet SVE:  Dilation: 1.5 Effacement (%): 50 Station: -3 Exam by:: JDaley   Discharge Condition: Stable  Disposition: Discharge disposition: 01-Home or Self Care        Allergies as of 06/05/2024       Reactions   Penicillins Hives        Medication List     TAKE these medications    aspirin  81 MG chewable tablet Chew 81 mg by mouth daily.   lamoTRIgine  25 MG  tablet Commonly known as: LAMICTAL  Take 200 mg by mouth daily.   metoprolol  tartrate 25 MG tablet Commonly known as: LOPRESSOR  Take 1 tablet (25 mg total) by mouth 2 (two) times daily.   NIFEdipine  10 MG capsule Commonly known as: PROCARDIA  Take 1 capsule (10 mg total) by mouth every 6 (six) hours as needed.   ondansetron  4 MG disintegrating tablet Commonly known as: ZOFRAN -ODT Take 1 tablet (4 mg total) by mouth every 8 (eight) hours as needed for nausea or vomiting.   prenatal multivitamin Tabs tablet Take 1 tablet by mouth daily at 12 noon.         SignedBETHA DELON COE, CNM 06/05/2024 1:35 AM

## 2024-06-07 ENCOUNTER — Other Ambulatory Visit: Payer: Self-pay

## 2024-06-07 ENCOUNTER — Observation Stay
Admission: EM | Admit: 2024-06-07 | Discharge: 2024-06-07 | Disposition: A | Discharge status: Home / Self Care | Attending: Obstetrics and Gynecology | Admitting: Obstetrics and Gynecology

## 2024-06-07 ENCOUNTER — Encounter: Payer: Self-pay | Admitting: Obstetrics and Gynecology

## 2024-06-07 DIAGNOSIS — O4290 Premature rupture of membranes, unspecified as to length of time between rupture and onset of labor, unspecified weeks of gestation: Secondary | ICD-10-CM | POA: Diagnosis present

## 2024-06-07 DIAGNOSIS — Z79899 Other long term (current) drug therapy: Secondary | ICD-10-CM | POA: Diagnosis not present

## 2024-06-07 DIAGNOSIS — Z7982 Long term (current) use of aspirin: Secondary | ICD-10-CM | POA: Diagnosis not present

## 2024-06-07 DIAGNOSIS — O26893 Other specified pregnancy related conditions, third trimester: Principal | ICD-10-CM | POA: Insufficient documentation

## 2024-06-07 DIAGNOSIS — Z3A37 37 weeks gestation of pregnancy: Secondary | ICD-10-CM | POA: Insufficient documentation

## 2024-06-07 DIAGNOSIS — Z87891 Personal history of nicotine dependence: Secondary | ICD-10-CM | POA: Insufficient documentation

## 2024-06-07 DIAGNOSIS — O4700 False labor before 37 completed weeks of gestation, unspecified trimester: Principal | ICD-10-CM

## 2024-06-07 DIAGNOSIS — N898 Other specified noninflammatory disorders of vagina: Secondary | ICD-10-CM | POA: Insufficient documentation

## 2024-06-07 LAB — URINALYSIS, COMPLETE (UACMP) WITH MICROSCOPIC
Bilirubin Urine: NEGATIVE
Glucose, UA: NEGATIVE mg/dL
Hgb urine dipstick: NEGATIVE
Ketones, ur: NEGATIVE mg/dL
Nitrite: NEGATIVE
Protein, ur: NEGATIVE mg/dL
Specific Gravity, Urine: 1.027 (ref 1.005–1.030)
pH: 6 (ref 5.0–8.0)

## 2024-06-07 LAB — RUPTURE OF MEMBRANE (ROM)PLUS: Rom Plus: NEGATIVE

## 2024-06-07 MED ORDER — ACETAMINOPHEN 325 MG PO TABS: 650.0000 mg | ORAL_TABLET | ORAL | Status: DC | PRN

## 2024-06-07 MED ORDER — DOCUSATE SODIUM 100 MG PO CAPS: 100.0000 mg | ORAL_CAPSULE | Freq: Every day | ORAL | Status: DC

## 2024-06-07 MED ORDER — CALCIUM CARBONATE ANTACID 500 MG PO CHEW: 2.0000 | CHEWABLE_TABLET | ORAL | Status: DC | PRN

## 2024-06-07 NOTE — OB Triage Note (Signed)
 Patient is a H5E8887 at [redacted]w[redacted]d who presents to unit c/o leakage of fluid since around 0001 last night, constant lower back pain, and decreased fetal movement. Denies contractions and vaginal bleeding. External monitors applied and assessing. Initial FHT 155. Vital signs WDL.

## 2024-06-07 NOTE — OB Triage Note (Signed)
Discharge instructions, labor precautions, and follow-up care reviewed with patient. All questions answered. Patient verbalized understanding. Discharged ambulatory off unit.   

## 2024-06-07 NOTE — Discharge Summary (Signed)
 Brenda Fuentes is a 28 y.o. female. She is at [redacted]w[redacted]d gestation. Patient's last menstrual period was 09/12/2023. Estimated Date of Delivery: 06/25/24  [facility]   Current pregnancy complicated by:  Anemia POTS - diagnosed 03/05/24 GBS positive Non Epileptic Seizure Disorder Hx of Gestational HTN  H/o mental health diagnoses: Anxiety and Depression  Multiple Sclerosis  Obesity BMI: 35.8 H/o Migraines  Former tobacco use in pregnancy  Hx of PTD  COVID during pregnancy  Chief complaint: believes her water broke  She reports she believes her water broke last night. She was in the pool and it felt wet and warm like she had voided on herself. She got out of the pool and had another gush of discharge that felt like she had voided herself, but does not believe she voided. She reports a small amount of more discharge today. No bleeding. No contractions. Reports low back pain.  S: Resting comfortably. no CTX, no VB.no LOF,  Active fetal movement.  Denies: HA, visual changes, SOB, or RUQ/epigastric pain  Maternal Medical History:   Past Medical History:  Diagnosis Date   Anxiety    Depression 2016   Gestational hypertension    Hypercholesteremia 2009   Multiple sclerosis (HCC)    POTS (postural orthostatic tachycardia syndrome)    TB lung, latent     Past Surgical History:  Procedure Laterality Date   NO PAST SURGERIES      Allergies  Allergen Reactions   Penicillins Hives    Prior to Admission medications   Medication Sig Start Date End Date Taking? Authorizing Provider  aspirin  81 MG chewable tablet Chew 81 mg by mouth daily.    [provider]  lamoTRIgine  (LAMICTAL ) 25 MG tablet Take 200 mg by mouth daily.    [provider]  metoprolol  tartrate (LOPRESSOR ) 25 MG tablet Take 1 tablet (25 mg total) by mouth 2 (two) times daily. 03/05/24 05/11/24  Liboon, Jazmine, CNM  NIFEdipine  (PROCARDIA ) 10 MG capsule Take 1 capsule (10  mg total) by mouth every 6 (six) hours as needed. 05/04/24   Myron Nest, CNM  ondansetron  (ZOFRAN -ODT) 4 MG disintegrating tablet Take 1 tablet (4 mg total) by mouth every 8 (eight) hours as needed for nausea or vomiting. 05/09/24   Myron Nest, CNM  Prenatal Vit-Fe Fumarate-FA (PRENATAL MULTIVITAMIN) TABS tablet Take 1 tablet by mouth daily at 12 noon.    [provider]    Social History: She  reports that she quit smoking about 5 years ago. Her smoking use included cigarettes. She has never used smokeless tobacco. She reports that she does not drink alcohol and does not use drugs.  Family History: family history includes Diabetes in her father; Heart failure in her father; Hyperlipidemia in her mother.  no history of gyn cancers  Review of Systems: A full review of systems was performed and negative except as noted in the HPI.     O:  BP 120/75 (BP Location: Left Arm)   Pulse 80   Temp 98.1 F (36.7 C) (Oral)   Resp 15   Ht 5' 6 (1.676 m)   Wt 104.3 kg   LMP 09/12/2023   BMI 37.12 kg/m  Results for orders placed or performed during the hospital encounter of 06/07/24 (from the past 48 hours)  Rupture of Membrane (ROM) Plus   Collection Time: 06/07/24  2:25 PM  Result Value Ref Range   Rom Plus NEGATIVE   Urinalysis, Complete w Microscopic -  Urine, Clean Catch   Collection Time: 06/07/24  2:25 PM  Result Value Ref Range   Color, Urine YELLOW (A) YELLOW   APPearance CLOUDY (A) CLEAR   Specific Gravity, Urine 1.027 1.005 - 1.030   pH 6.0 5.0 - 8.0   Glucose, UA NEGATIVE NEGATIVE mg/dL   Hgb urine dipstick NEGATIVE NEGATIVE   Bilirubin Urine NEGATIVE NEGATIVE   Ketones, ur NEGATIVE NEGATIVE mg/dL   Protein, ur NEGATIVE NEGATIVE mg/dL   Nitrite NEGATIVE NEGATIVE   Leukocytes,Ua TRACE (A) NEGATIVE   RBC / HPF 0-5 0 - 5 RBC/hpf   WBC, UA 11-20 0 - 5 WBC/hpf   Bacteria, UA MANY (A) NONE SEEN   Squamous Epithelial / HPF 11-20 0 - 5 /HPF   Mucus PRESENT       Constitutional: NAD, AAOx3  HE/ENT: extraocular movements grossly intact, moist mucous membranes CV: RRR PULM: nl respiratory effort, CTABL     Abd: gravid, non-tender, non-distended, soft      Ext: Non-tender, Nonedematous   Psych: mood appropriate, speech normal Pelvic: deferred  Fetal  monitoring: Cat 1 Appropriate for GA Baseline: 145bpm Variability: moderate Accelerations: present x >2 Decelerations absent Time  A/P: 28 y.o. [redacted]w[redacted]d here for antenatal surveillance for vaginal discharge, possible leaking fluid  Principle Diagnosis:  Vaginal discharge in pregnancy  Vaginal discharge: not amniotic fluid, ROM+ negative, UA negative Labor: not present. No contractions present.   Fetal Wellbeing: Reassuring Cat 1 tracing. Reactive NST  D/c home stable, precautions reviewed, follow-up as scheduled.    Edsel Charlies Blush, CNM 06/07/2024 3:31 PM

## 2024-06-13 ENCOUNTER — Inpatient Hospital Stay: Admitting: Certified Registered"

## 2024-06-13 ENCOUNTER — Other Ambulatory Visit: Payer: Self-pay

## 2024-06-13 ENCOUNTER — Inpatient Hospital Stay
Admission: EM | Admit: 2024-06-13 | Discharge: 2024-06-15 | DRG: 806 | Disposition: A | Discharge status: Home / Self Care | Attending: Obstetrics | Admitting: Obstetrics

## 2024-06-13 ENCOUNTER — Encounter: Payer: Self-pay | Admitting: Obstetrics and Gynecology

## 2024-06-13 DIAGNOSIS — O99824 Streptococcus B carrier state complicating childbirth: Secondary | ICD-10-CM | POA: Diagnosis present

## 2024-06-13 DIAGNOSIS — Z79899 Other long term (current) drug therapy: Secondary | ICD-10-CM

## 2024-06-13 DIAGNOSIS — O4700 False labor before 37 completed weeks of gestation, unspecified trimester: Principal | ICD-10-CM

## 2024-06-13 DIAGNOSIS — Z8249 Family history of ischemic heart disease and other diseases of the circulatory system: Secondary | ICD-10-CM | POA: Diagnosis not present

## 2024-06-13 DIAGNOSIS — Z833 Family history of diabetes mellitus: Secondary | ICD-10-CM | POA: Diagnosis not present

## 2024-06-13 DIAGNOSIS — Z7982 Long term (current) use of aspirin: Secondary | ICD-10-CM

## 2024-06-13 DIAGNOSIS — Z880 Allergy status to penicillin: Secondary | ICD-10-CM

## 2024-06-13 DIAGNOSIS — O4292 Full-term premature rupture of membranes, unspecified as to length of time between rupture and onset of labor: Principal | ICD-10-CM | POA: Diagnosis present

## 2024-06-13 DIAGNOSIS — Z8759 Personal history of other complications of pregnancy, childbirth and the puerperium: Secondary | ICD-10-CM

## 2024-06-13 DIAGNOSIS — O99214 Obesity complicating childbirth: Secondary | ICD-10-CM | POA: Diagnosis present

## 2024-06-13 DIAGNOSIS — Z3A38 38 weeks gestation of pregnancy: Secondary | ICD-10-CM

## 2024-06-13 DIAGNOSIS — Z87891 Personal history of nicotine dependence: Secondary | ICD-10-CM

## 2024-06-13 DIAGNOSIS — Z8616 Personal history of COVID-19: Secondary | ICD-10-CM | POA: Diagnosis not present

## 2024-06-13 DIAGNOSIS — D62 Acute posthemorrhagic anemia: Secondary | ICD-10-CM | POA: Diagnosis not present

## 2024-06-13 DIAGNOSIS — G35 Multiple sclerosis: Secondary | ICD-10-CM | POA: Diagnosis present

## 2024-06-13 DIAGNOSIS — O4290 Premature rupture of membranes, unspecified as to length of time between rupture and onset of labor, unspecified weeks of gestation: Secondary | ICD-10-CM | POA: Diagnosis present

## 2024-06-13 DIAGNOSIS — O9081 Anemia of the puerperium: Secondary | ICD-10-CM | POA: Diagnosis not present

## 2024-06-13 DIAGNOSIS — O99354 Diseases of the nervous system complicating childbirth: Secondary | ICD-10-CM | POA: Diagnosis present

## 2024-06-13 DIAGNOSIS — O09899 Supervision of other high risk pregnancies, unspecified trimester: Secondary | ICD-10-CM

## 2024-06-13 DIAGNOSIS — O26893 Other specified pregnancy related conditions, third trimester: Secondary | ICD-10-CM | POA: Diagnosis present

## 2024-06-13 DIAGNOSIS — G90A Postural orthostatic tachycardia syndrome (POTS): Secondary | ICD-10-CM | POA: Diagnosis present

## 2024-06-13 DIAGNOSIS — O99019 Anemia complicating pregnancy, unspecified trimester: Secondary | ICD-10-CM | POA: Diagnosis present

## 2024-06-13 LAB — CBC
HCT: 33.8 % — ABNORMAL LOW (ref 36.0–46.0)
Hemoglobin: 10.7 g/dL — ABNORMAL LOW (ref 12.0–15.0)
MCH: 24.5 pg — ABNORMAL LOW (ref 26.0–34.0)
MCHC: 31.7 g/dL (ref 30.0–36.0)
MCV: 77.3 fL — ABNORMAL LOW (ref 80.0–100.0)
Platelets: 304 10*3/uL (ref 150–400)
RBC: 4.37 MIL/uL (ref 3.87–5.11)
RDW: 17.3 % — ABNORMAL HIGH (ref 11.5–15.5)
WBC: 11.6 10*3/uL — ABNORMAL HIGH (ref 4.0–10.5)
nRBC: 0 % (ref 0.0–0.2)

## 2024-06-13 LAB — TYPE AND SCREEN
ABO/RH(D): B POS
Antibody Screen: NEGATIVE

## 2024-06-13 LAB — RPR: RPR Ser Ql: NONREACTIVE

## 2024-06-13 LAB — RUPTURE OF MEMBRANE (ROM)PLUS: Rom Plus: POSITIVE

## 2024-06-13 MED ORDER — METOPROLOL TARTRATE 25 MG PO TABS
25.0000 mg | ORAL_TABLET | Freq: Two times a day (BID) | ORAL | Status: DC
  Administered 2024-06-13 – 2024-06-15 (×4): 25 mg via ORAL
  Filled 2024-06-13 (×6): qty 1

## 2024-06-13 MED ORDER — SODIUM CHLORIDE 0.9% FLUSH: 3.0000 mL | Freq: Two times a day (BID) | INTRAVENOUS | Status: DC

## 2024-06-13 MED ORDER — BENZOCAINE-MENTHOL 20-0.5 % EX AERO
1.0000 | INHALATION_SPRAY | CUTANEOUS | Status: DC | PRN
  Filled 2024-06-13: qty 56

## 2024-06-13 MED ORDER — LIDOCAINE-EPINEPHRINE (PF) 1.5 %-1:200000 IJ SOLN
INTRAMUSCULAR | Status: DC | PRN
  Administered 2024-06-13: 3 mL via EPIDURAL

## 2024-06-13 MED ORDER — FENTANYL CITRATE (PF) 100 MCG/2ML IJ SOLN: 50.0000 ug | INTRAMUSCULAR | Status: DC | PRN

## 2024-06-13 MED ORDER — MISOPROSTOL 200 MCG PO TABS
ORAL_TABLET | ORAL | Status: AC
  Filled 2024-06-13: qty 4

## 2024-06-13 MED ORDER — LIDOCAINE HCL (PF) 1 % IJ SOLN
INTRAMUSCULAR | Status: DC | PRN
  Administered 2024-06-13: 3 mL via SUBCUTANEOUS

## 2024-06-13 MED ORDER — SODIUM CHLORIDE 0.9% FLUSH: 3.0000 mL | INTRAVENOUS | Status: DC | PRN

## 2024-06-13 MED ORDER — OXYTOCIN BOLUS FROM INFUSION
333.0000 mL | Freq: Once | INTRAVENOUS | Status: AC
  Administered 2024-06-13: 333 mL via INTRAVENOUS

## 2024-06-13 MED ORDER — DIBUCAINE (PERIANAL) 1 % EX OINT
1.0000 | TOPICAL_OINTMENT | CUTANEOUS | Status: DC | PRN
  Filled 2024-06-13: qty 28

## 2024-06-13 MED ORDER — ACETAMINOPHEN 500 MG PO TABS
1000.0000 mg | ORAL_TABLET | Freq: Four times a day (QID) | ORAL | Status: DC
  Administered 2024-06-13 – 2024-06-15 (×6): 1000 mg via ORAL
  Filled 2024-06-13 (×6): qty 2

## 2024-06-13 MED ORDER — FENTANYL-BUPIVACAINE-NACL 0.5-0.125-0.9 MG/250ML-% EP SOLN
EPIDURAL | Status: AC
  Filled 2024-06-13: qty 250

## 2024-06-13 MED ORDER — AMMONIA AROMATIC IN INHA
RESPIRATORY_TRACT | Status: AC
  Filled 2024-06-13: qty 10

## 2024-06-13 MED ORDER — PHENYLEPHRINE 80 MCG/ML (10ML) SYRINGE FOR IV PUSH (FOR BLOOD PRESSURE SUPPORT)
80.0000 ug | PREFILLED_SYRINGE | INTRAVENOUS | Status: DC | PRN
Start: 2024-06-13 — End: 2024-06-13

## 2024-06-13 MED ORDER — BUPIVACAINE HCL (PF) 0.25 % IJ SOLN
INTRAMUSCULAR | Status: DC | PRN
  Administered 2024-06-13 (×2): 5 mL via EPIDURAL

## 2024-06-13 MED ORDER — LACTATED RINGERS IV SOLN
500.0000 mL | INTRAVENOUS | Status: DC | PRN
  Administered 2024-06-13: 500 mL via INTRAVENOUS

## 2024-06-13 MED ORDER — LIDOCAINE HCL (PF) 1 % IJ SOLN: 30.0000 mL | INTRAMUSCULAR | Status: DC | PRN

## 2024-06-13 MED ORDER — CEFAZOLIN SODIUM-DEXTROSE 2-4 GM/100ML-% IV SOLN
2.0000 g | Freq: Once | INTRAVENOUS | Status: AC
  Administered 2024-06-13: 2 g via INTRAVENOUS
  Filled 2024-06-13: qty 100

## 2024-06-13 MED ORDER — SOD CITRATE-CITRIC ACID 500-334 MG/5ML PO SOLN: 30.0000 mL | ORAL | Status: DC | PRN

## 2024-06-13 MED ORDER — SENNOSIDES-DOCUSATE SODIUM 8.6-50 MG PO TABS
2.0000 | ORAL_TABLET | Freq: Every day | ORAL | Status: DC
  Administered 2024-06-14 – 2024-06-15 (×2): 2 via ORAL
  Filled 2024-06-13 (×2): qty 2

## 2024-06-13 MED ORDER — ZOLPIDEM TARTRATE 5 MG PO TABS: 5.0000 mg | ORAL_TABLET | Freq: Every evening | ORAL | Status: DC | PRN

## 2024-06-13 MED ORDER — FENTANYL-BUPIVACAINE-NACL 0.5-0.125-0.9 MG/250ML-% EP SOLN: 12.0000 mL/h | EPIDURAL | Status: DC | PRN

## 2024-06-13 MED ORDER — CEFAZOLIN SODIUM-DEXTROSE 1-4 GM/50ML-% IV SOLN: 1.0000 g | Freq: Three times a day (TID) | INTRAVENOUS | Status: DC

## 2024-06-13 MED ORDER — LACTATED RINGERS IV SOLN: INTRAVENOUS | Status: DC

## 2024-06-13 MED ORDER — WITCH HAZEL-GLYCERIN EX PADS
1.0000 | MEDICATED_PAD | CUTANEOUS | Status: DC | PRN
  Filled 2024-06-13: qty 100

## 2024-06-13 MED ORDER — ONDANSETRON HCL 4 MG PO TABS: 4.0000 mg | ORAL_TABLET | ORAL | Status: DC | PRN

## 2024-06-13 MED ORDER — FENTANYL-BUPIVACAINE-NACL 0.5-0.125-0.9 MG/250ML-% EP SOLN
EPIDURAL | Status: DC | PRN
  Administered 2024-06-13: 12 mL/h via EPIDURAL

## 2024-06-13 MED ORDER — DIPHENHYDRAMINE HCL 25 MG PO CAPS: 25.0000 mg | ORAL_CAPSULE | Freq: Four times a day (QID) | ORAL | Status: DC | PRN

## 2024-06-13 MED ORDER — PRENATAL MULTIVITAMIN CH
1.0000 | ORAL_TABLET | Freq: Every day | ORAL | Status: DC
  Administered 2024-06-14: 1 via ORAL
  Filled 2024-06-13: qty 1

## 2024-06-13 MED ORDER — SODIUM CHLORIDE 0.9 % IV SOLN: 250.0000 mL | INTRAVENOUS | Status: DC | PRN

## 2024-06-13 MED ORDER — ACETAMINOPHEN 325 MG PO TABS: 650.0000 mg | ORAL_TABLET | ORAL | Status: DC | PRN

## 2024-06-13 MED ORDER — ONDANSETRON HCL 4 MG/2ML IJ SOLN: 4.0000 mg | INTRAMUSCULAR | Status: DC | PRN

## 2024-06-13 MED ORDER — DIPHENHYDRAMINE HCL 50 MG/ML IJ SOLN: 12.5000 mg | INTRAMUSCULAR | Status: DC | PRN

## 2024-06-13 MED ORDER — FERROUS SULFATE 325 (65 FE) MG PO TABS
325.0000 mg | ORAL_TABLET | Freq: Two times a day (BID) | ORAL | Status: DC
  Administered 2024-06-14 – 2024-06-15 (×3): 325 mg via ORAL
  Filled 2024-06-13 (×3): qty 1

## 2024-06-13 MED ORDER — COCONUT OIL OIL
1.0000 | TOPICAL_OIL | Status: DC | PRN
  Filled 2024-06-13: qty 7.5
  Filled 2024-06-13: qty 15
  Filled 2024-06-13: qty 7.5

## 2024-06-13 MED ORDER — ONDANSETRON HCL 4 MG/2ML IJ SOLN: 4.0000 mg | Freq: Four times a day (QID) | INTRAMUSCULAR | Status: DC | PRN

## 2024-06-13 MED ORDER — LIDOCAINE HCL (PF) 1 % IJ SOLN
INTRAMUSCULAR | Status: AC
  Filled 2024-06-13: qty 30

## 2024-06-13 MED ORDER — LAMOTRIGINE 100 MG PO TABS
200.0000 mg | ORAL_TABLET | Freq: Every day | ORAL | Status: DC
  Administered 2024-06-13 – 2024-06-14 (×2): 200 mg via ORAL
  Filled 2024-06-13 (×3): qty 2

## 2024-06-13 MED ORDER — EPHEDRINE 5 MG/ML INJ: 10.0000 mg | INTRAVENOUS | Status: DC | PRN

## 2024-06-13 MED ORDER — EPHEDRINE 5 MG/ML INJ
10.0000 mg | INTRAVENOUS | Status: DC | PRN
Start: 2024-06-13 — End: 2024-06-13

## 2024-06-13 MED ORDER — OXYTOCIN-SODIUM CHLORIDE 30-0.9 UT/500ML-% IV SOLN
INTRAVENOUS | Status: AC
  Filled 2024-06-13: qty 500

## 2024-06-13 MED ORDER — SIMETHICONE 80 MG PO CHEW: 80.0000 mg | CHEWABLE_TABLET | ORAL | Status: DC | PRN

## 2024-06-13 MED ORDER — OXYTOCIN 10 UNIT/ML IJ SOLN
INTRAMUSCULAR | Status: AC
  Filled 2024-06-13: qty 2

## 2024-06-13 MED ORDER — OXYTOCIN-SODIUM CHLORIDE 30-0.9 UT/500ML-% IV SOLN
2.5000 [IU]/h | INTRAVENOUS | Status: DC
  Administered 2024-06-13: 2.5 [IU]/h via INTRAVENOUS

## 2024-06-13 MED ORDER — PHENYLEPHRINE 80 MCG/ML (10ML) SYRINGE FOR IV PUSH (FOR BLOOD PRESSURE SUPPORT): 80.0000 ug | PREFILLED_SYRINGE | INTRAVENOUS | Status: DC | PRN

## 2024-06-13 MED ORDER — IBUPROFEN 600 MG PO TABS
600.0000 mg | ORAL_TABLET | Freq: Four times a day (QID) | ORAL | Status: DC
  Administered 2024-06-13 – 2024-06-15 (×7): 600 mg via ORAL
  Filled 2024-06-13 (×7): qty 1

## 2024-06-13 NOTE — Anesthesia Preprocedure Evaluation (Signed)
 Anesthesia Evaluation  Patient identified by MRN, date of birth, ID band Patient awake    Reviewed: Allergy & Precautions, H&P , NPO status , Patient's Chart, lab work & pertinent test results  Airway Mallampati: II  TM Distance: >3 FB Neck ROM: full    Dental no notable dental hx. (+) Teeth Intact   Pulmonary neg pulmonary ROS, former smoker   Pulmonary exam normal breath sounds clear to auscultation       Cardiovascular hypertension, Normal cardiovascular exam Rhythm:regular Rate:Normal     Neuro/Psych  PSYCHIATRIC DISORDERS Anxiety Depression    Pt w/multiple sclerosis, no currently receiving treatment  Neuromuscular disease    GI/Hepatic negative GI ROS, Neg liver ROS,,,  Endo/Other  negative endocrine ROS    Renal/GU negative Renal ROS  negative genitourinary   Musculoskeletal   Abdominal   Peds  Hematology negative hematology ROS (+)   Anesthesia Other Findings   Reproductive/Obstetrics (+) Pregnancy                              Anesthesia Physical Anesthesia Plan  ASA: 2  Anesthesia Plan: Epidural   Post-op Pain Management:    Induction:   PONV Risk Score and Plan:   Airway Management Planned:   Additional Equipment:   Intra-op Plan:   Post-operative Plan:   Informed Consent: I have reviewed the patients History and Physical, chart, labs and discussed the procedure including the risks, benefits and alternatives for the proposed anesthesia with the patient or authorized representative who has indicated his/her understanding and acceptance.       Plan Discussed with: CRNA and Anesthesiologist  Anesthesia Plan Comments:         Anesthesia Quick Evaluation

## 2024-06-13 NOTE — Anesthesia Procedure Notes (Signed)
 Epidural Patient location during procedure: OB Start time: 06/13/2024 7:20 AM End time: 06/13/2024 7:45 AM  Staffing Anesthesiologist: Vicci Camellia Glatter, MD Resident/CRNA: Landy Hone D, CRNA Performed: resident/CRNA   Preanesthetic Checklist Completed: patient identified, IV checked, site marked, risks and benefits discussed, surgical consent, monitors and equipment checked, pre-op evaluation and timeout performed  Epidural Patient position: sitting Prep: ChloraPrep Patient monitoring: heart rate, continuous pulse ox and blood pressure Approach: midline Location: L3-L4 Injection technique: LOR saline  Needle:  Needle type: Tuohy  Needle gauge: 17 G Needle length: 9 cm and 9 Needle insertion depth: 8 cm Catheter type: closed end flexible Catheter size: 19 Gauge Catheter at skin depth: 14 cm Test dose: negative and 1.5% lidocaine  with Epi 1:200 K  Assessment Sensory level: T10 Events: blood not aspirated, no cerebrospinal fluid, injection not painful, no injection resistance, no paresthesia and negative IV test  Additional Notes 1 attempt Pt. Evaluated and documentation done after procedure finished. Patient identified. Risks/Benefits/Options discussed with patient including but not limited to bleeding, infection, nerve damage, paralysis, failed block, incomplete pain control, headache, blood pressure changes, nausea, vomiting, reactions to medication both or allergic, itching and postpartum back pain. Confirmed with bedside nurse the patient's most recent platelet count. Confirmed with patient that they are not currently taking any anticoagulation, have any bleeding history or any family history of bleeding disorders. Patient expressed understanding and wished to proceed. All questions were answered. Sterile technique was used throughout the entire procedure. Please see nursing notes for vital signs. Test dose was given through epidural catheter and negative prior to  continuing to dose epidural or start infusion. Warning signs of high block given to the patient including shortness of breath, tingling/numbness in hands, complete motor block, or any concerning symptoms with instructions to call for help. Patient was given instructions on fall risk and not to get out of bed. All questions and concerns addressed with instructions to call with any issues or inadequate analgesia.    Patient tolerated the insertion well without immediate complications.Reason for block:procedure for pain

## 2024-06-13 NOTE — Progress Notes (Signed)
 L&D Note    Subjective:  More comfortable after epidural  Objective:    Current Vital Signs 24h Vital Sign Ranges  T 98.1 F (36.7 C) Temp  Avg: 98 F (36.7 C)  Min: 97.9 F (36.6 C)  Max: 98.1 F (36.7 C)  BP 135/78 BP  Min: 120/90  Max: 146/85  HR 65 Pulse  Avg: 68.9  Min: 56  Max: 75  RR 18 Resp  Avg: 17  Min: 16  Max: 18  SaO2 100 %   SpO2  Avg: 99.9 %  Min: 99 %  Max: 100 %      Gen: alert, cooperative, no distress FHR: Baseline: 145 bpm, Variability: moderate, Accels: Present, Decels: none Toco: regular, every 3-5 minutes SVE: Dilation: (P) 2 Effacement (%): (P) 70 Cervical Position: (P) Anterior Station: (P) -3 Presentation: (P) Vertex Exam by:: (P) Brenda Fuentes, CNM  Medications SCHEDULED MEDICATIONS   ammonia       lidocaine  (PF)       misoprostol        oxytocin        oxytocin  40 units in LR 1000 mL  333 mL Intravenous Once   Oxytocin -Sodium Chloride         MEDICATION INFUSIONS    ceFAZolin  (ANCEF ) IV     lactated ringers  500 mL (06/13/24 0729)   lactated ringers  125 mL/hr at 06/13/24 0655   oxytocin       PRN MEDICATIONS  acetaminophen , ammonia, fentaNYL  (SUBLIMAZE ) injection, lactated ringers , lidocaine  (PF), lidocaine  (PF), misoprostol , ondansetron , oxytocin , Oxytocin -Sodium Chloride , sodium citrate-citric acid    Assessment & Plan:  28 y.o. [id number] at [redacted]w[redacted]d admitted for PROM -Labor: Early latent labor., Adequate uterine activity - intensity and frequency., and Satisfactory labor progress. Will defer augmentation with oxytocin  until adequately treated for GBS. She is contracting well and making cervical change.  -Fetal Well-being: Category I -GBS: positive - Ancef  x 1 given  -Membranes ruptured, clear fluid, spontaneously at 0430 -Continue present management. -Analgesia: regional anesthesia   Brenda Fuentes, CNM  06/13/2024 8:39 AM  [facility]

## 2024-06-13 NOTE — H&P (Signed)
 OB History & Physical   History of Present Illness:  Chief Complaint:   HPI:  Brenda Fuentes is a 28 y.o. [phone] female at [redacted]w[redacted]d dated by 8wk US .  She presents to L&D for contractions and leaking amniotic fluid since 0430.   Pregnancy Issues: Anemia POTS - diagnosed 03/05/24 GBS positive Non Epileptic Seizure Disorder Hx of Gestational HTN  H/o mental health diagnoses: Anxiety and Depression  Multiple Sclerosis  Obesity BMI: 35.8 H/o Migraines  Former tobacco use in pregnancy  Hx of PTD  COVID during pregnancy    Maternal Medical History:   Past Medical History:  Diagnosis Date   Anxiety    Depression 2016   Gestational hypertension    Hypercholesteremia 2009   Multiple sclerosis (HCC)    POTS (postural orthostatic tachycardia syndrome)    TB lung, latent     Past Surgical History:  Procedure Laterality Date   NO PAST SURGERIES      Allergies  Allergen Reactions   Penicillins Hives    Prior to Admission medications   Medication Sig Start Date End Date Taking? Authorizing Provider  aspirin  81 MG chewable tablet Chew 81 mg by mouth daily.   Yes [provider]  lamoTRIgine  (LAMICTAL ) 25 MG tablet Take 200 mg by mouth daily.   Yes [provider]  ondansetron  (ZOFRAN -ODT) 4 MG disintegrating tablet Take 1 tablet (4 mg total) by mouth every 8 (eight) hours as needed for nausea or vomiting. 05/09/24  Yes Myron Nest, CNM  Prenatal Vit-Fe Fumarate-FA (PRENATAL MULTIVITAMIN) TABS tablet Take 1 tablet by mouth daily at 12 noon.   Yes [provider]  metoprolol  tartrate (LOPRESSOR ) 25 MG tablet Take 1 tablet (25 mg total) by mouth 2 (two) times daily. 03/05/24 05/11/24  Liboon, Jazmine, CNM  NIFEdipine  (PROCARDIA ) 10 MG capsule Take 1 capsule (10 mg total) by mouth every 6 (six) hours as needed. Patient not taking: Reported on 06/13/2024 05/04/24   Myron Nest, CNM   [facility] care [facility]: [facility]  Social History: She   reports that she quit smoking about 5 years ago. Her smoking use included cigarettes. She has never used smokeless tobacco. She reports that she does not drink alcohol and does not use drugs.  Family History: family history includes Diabetes in her father; Heart failure in her father; Hyperlipidemia in her mother.   Review of Systems: A full review of systems was performed and negative except as noted in the HPI.     Physical Exam:  Vital Signs: BP 138/68 (BP Location: Left Arm)   Pulse (!) 56   Temp 97.9 F (36.6 C) (Oral)   Resp 16   LMP 09/12/2023  General: no acute distress.  HEENT: normocephalic, atraumatic Heart: regular rate & rhythm.  No murmurs/rubs/gallops Lungs: clear to auscultation bilaterally, normal respiratory effort Abdomen: soft, gravid, non-tender;  EFW: 7.5lb Pelvic:   External: Normal external female genitalia  Cervix: Dilation: 1.5 / Effacement (%): 50 / Station: -3    Extremities: non-tender, symmetric, no edema bilaterally.  DTRs: +2  Neurologic: Alert & oriented x 3.    Results for orders placed or performed during the hospital encounter of 06/13/24 (from the past 24 hours)  Rupture of Membrane (ROM) Plus     Status: None   Collection Time: 06/13/24  5:38 AM  Result Value Ref Range   Rom Plus POSITIVE     Pertinent Results:  Prenatal Labs: Blood type/Rh B pos  Antibody screen neg  Rubella  Immune  Varicella Immune  RPR NR  HBsAg Neg  HIV NR  GC neg  Chlamydia neg  Genetic screening negative  1 hour GTT Checked blood sugars QID x 2wks  3 hour GTT   GBS positive   FHT: 130bpm, moderate variability, accelerations present, no decelerations, Category I tracing TOCO: contractions q89min SVE:  Dilation: 1.5 / Effacement (%): 50 / Station: -3    Cephalic by leopolds  No results found.  Assessment:  Brenda Fuentes is a 28 y.o. [phone] female at [redacted]w[redacted]d with SROM and contractions.   Plan:  1. Admit to Labor & Delivery; consents reviewed and  obtained - Dr. Verdon notified of admission and plan of care  2. Fetal Well being  - Fetal Tracing: Category I tracing - Group B Streptococcus ppx indicated: GBS positive, allergic to PCN, treat with Keflex  - Presentation: vertex confirmed by SVE   3. Routine OB: - Prenatal labs reviewed, as above - Rh positive - CBC, T&S, RPR on admit - Clear fluids, saline lock  4. Monitoring of Labor -  Contractions q63min, external toco in place -  Pelvis proven to 3010g, adequate for trial of labor -  Plan for augmentation with pitocin  if needed -  Plan for continuous fetal monitoring  -  Maternal pain control as desired; requesting regional anesthesia - Anticipate vaginal delivery  5. Post Partum Planning: - Infant feeding: breast - Contraception: BTL - Tdap: received AP - Flu: declined  Edsel Charlies Blush, CNM 06/13/24 6:12 AM

## 2024-06-13 NOTE — Progress Notes (Signed)
 L&D Note    Subjective:  Comfortable with epidural, feeling intermittent pressure   Objective:    Current Vital Signs 24h Vital Sign Ranges  T 98.1 F (36.7 C) Temp  Avg: 98 F (36.7 C)  Min: 97.9 F (36.6 C)  Max: 98.1 F (36.7 C)  BP 135/78 BP  Min: 120/90  Max: 146/85  HR 65 Pulse  Avg: 68.9  Min: 56  Max: 75  RR 18 Resp  Avg: 17  Min: 16  Max: 18  SaO2 100 %   SpO2  Avg: 99.9 %  Min: 99 %  Max: 100 %      Gen: alert, cooperative, no distress FHR: Baseline: 145 bpm, Variability: moderate, Accels: Present, Decels: early versus late decelerations present - UTD d/t poor TOCO tracing  Toco: UTD - IUPC placed  SVE: 3/70/-1/soft/anterior   Medications SCHEDULED MEDICATIONS   ammonia       lidocaine  (PF)       misoprostol        oxytocin        oxytocin  40 units in LR 1000 mL  333 mL Intravenous Once   Oxytocin -Sodium Chloride         MEDICATION INFUSIONS    ceFAZolin  (ANCEF ) IV     lactated ringers  500 mL (06/13/24 0729)   lactated ringers  125 mL/hr at 06/13/24 0655   oxytocin       PRN MEDICATIONS  acetaminophen , ammonia, fentaNYL  (SUBLIMAZE ) injection, lactated ringers , lidocaine  (PF), lidocaine  (PF), misoprostol , ondansetron , oxytocin , Oxytocin -Sodium Chloride , sodium citrate-citric acid    Assessment & Plan:  28 y.o. [id number] at [redacted]w[redacted]d admitted for SROM -Labor: Early latent labor., Adequate uterine activity - intensity and frequency., and Satisfactory labor progress. -Fetal Well-being: Category II - overall reassuring, IUPC placed for clarfication  -GBS: positive - Ancef  x 1 given  -Membranes ruptured, clear fluid, spontaneously at 0430  -Continue present management. -Analgesia: regional anesthesia   Brenda Fuentes, CNM  06/13/2024 9:30 AM  Brenda Fuentes

## 2024-06-13 NOTE — Discharge Instructions (Signed)
 Vaginal Delivery, Care After Refer to this sheet in the next few weeks. These discharge instructions provide you with information on caring for yourself after delivery. Your caregiver may also give you specific instructions. Your treatment has been planned according to the most current medical practices available, but problems sometimes occur. Call your caregiver if you have any problems or questions after you go home. HOME CARE INSTRUCTIONS Take over-the-counter or prescription medicines only as directed by your caregiver or pharmacist. Do not drink alcohol, especially if you are breastfeeding or taking medicine to relieve pain. Do not smoke tobacco. Continue to use good perineal care. Good perineal care includes: Wiping your perineum from back to front Keeping your perineum clean. You can do sitz baths twice a day, to help keep this area clean Do not use tampons, douche or have sex until your caregiver says it is okay. Shower only and avoid sitting in submerged water, aside from sitz baths Wear a well-fitting bra that provides breast support. Eat healthy foods. Drink enough fluids to keep your urine clear or pale yellow. Eat high-fiber foods such as whole grain cereals and breads, brown rice, beans, and fresh fruits and vegetables every day. These foods may help prevent or relieve constipation. Avoid constipation with high fiber foods or medications, such as miralax or metamucil Follow your caregiver's recommendations regarding resumption of activities such as climbing stairs, driving, lifting, exercising, or traveling. Talk to your caregiver about resuming sexual activities. Resumption of sexual activities is dependent upon your risk of infection, your rate of healing, and your comfort and desire to resume sexual activity. Try to have someone help you with your household activities and your newborn for at least a few days after you leave the hospital. Rest as much as possible. Try to rest or  take a nap when your newborn is sleeping. Increase your activities gradually. Keep all of your scheduled postpartum appointments. It is very important to keep your scheduled follow-up appointments. At these appointments, your caregiver will be checking to make sure that you are healing physically and emotionally. SEEK MEDICAL CARE IF:  You are passing large clots from your vagina. Save any clots to show your caregiver. You have a foul smelling discharge from your vagina. You have trouble urinating. You are urinating frequently. You have pain when you urinate. You have a change in your bowel movements. You have increasing redness, pain, or swelling near your vaginal incision (episiotomy) or vaginal tear. You have pus draining from your episiotomy or vaginal tear. Your episiotomy or vaginal tear is separating. You have painful, hard, or reddened breasts. You have a severe headache. You have blurred vision or see spots. You feel sad or depressed. You have thoughts of hurting yourself or your newborn. You have questions about your care, the care of your newborn, or medicines. You are dizzy or light-headed. You have a rash. You have nausea or vomiting. You were breastfeeding and have not had a menstrual period within 12 weeks after you stopped breastfeeding. You are not breastfeeding and have not had a menstrual period by the 12th week after delivery. You have a fever. SEEK IMMEDIATE MEDICAL CARE IF:  You have persistent pain. You have chest pain. You have shortness of breath. You faint. You have leg pain. You have stomach pain. Your vaginal bleeding saturates two or more sanitary pads in 1 hour. MAKE SURE YOU:  Understand these instructions. Will watch your condition. Will get help right away if you are not doing well or  get worse. Document Released: 11/25/2000 Document Revised: 04/14/2014 Document Reviewed: 07/25/2012 [facility] Patient Information 2015 [place], [place]. This  information is not intended to replace advice given to you by your health care provider. Make sure you discuss any questions you have with your health care provider.  Sitz Bath A sitz bath is a warm water bath taken in the sitting position. The water covers only the hips and butt (buttocks). We recommend using one that fits in the toilet, to help with ease of use and cleanliness. It may be used for either healing or cleaning purposes. Sitz baths are also used to relieve pain, itching, or muscle tightening (spasms). The water may contain medicine. Moist heat will help you heal and relax.  HOME CARE  Take 3 to 4 sitz baths a day. Fill the bathtub half-full with warm water. Sit in the water and open the drain a little. Turn on the warm water to keep the tub half-full. Keep the water running constantly. Soak in the water for 15 to 20 minutes. After the sitz bath, pat the affected area dry. GET HELP RIGHT AWAY IF: You get worse instead of better. Stop the sitz baths if you get worse. MAKE SURE YOU: Understand these instructions. Will watch your condition. Will get help right away if you are not doing well or get worse. Document Released: 01/05/2005 Document Revised: 08/22/2012 Document Reviewed: 03/28/2011 [facility] Patient Information 2015 [place], [place]. This information is not intended to replace advice given to you by your health care provider. Make sure you discuss any questions you have with your health care provider.

## 2024-06-13 NOTE — Discharge Summary (Signed)
 Postpartum Discharge Summary  Patient Name: Brenda Fuentes DOB: [birth year] MRN: [id number]  Date of admission: 06/13/2024 Delivery date:06/13/2024 Delivering provider: MACKIE, ANNA M Date of discharge: 06/15/2024  Primary OB: [facility] OFE:Ejupzwu'd last menstrual period was 09/12/2023. EDC Estimated Date of Delivery: 06/25/24 Gestational Age at Delivery: [redacted]w[redacted]d   Admitting diagnosis: Amniotic fluid leaking [O42.90] Intrauterine pregnancy: [redacted]w[redacted]d     Secondary diagnosis:   Principal Problem:   NSVD (normal spontaneous vaginal delivery) Active Problems:   History of preterm delivery, currently pregnant   History of gestational hypertension   Amniotic fluid leaking   MS (multiple sclerosis) (HCC)   POTS (postural orthostatic tachycardia syndrome)   Anemia affecting pregnancy  Discharge Diagnosis: Term Pregnancy Delivered      Hospital course: Onset of Labor With Vaginal Delivery      28 y.o. yo H5E7886 at [redacted]w[redacted]d was admitted in Latent Labor on 06/13/2024. Labor course was uncomplicated. Jatziri presented to L&D with SROM. She was expectantly managed and started to spontaneously labor shortly after SROM. She progressed well to C/C/+2 with a strong urge to push.  She pushed effectively over approximately 10 minutes for a spontaneous vaginal birth.  Membrane Rupture Time/Date: 4:30 AM,06/13/2024  Delivery Method:Vaginal, Spontaneous Operative Delivery:N/A Episiotomy: None Lacerations:  None Patient had a postpartum course without complication.  She is ambulating, tolerating a regular diet, passing flatus, and urinating well. Patient is discharged home in stable condition on 06/15/24.  Newborn Data: Birth date:06/13/2024 Birth time:2:12 PM Gender:Female Living status:Living Apgars:9 ,9  Weight:2660 g                                            Post partum procedures:none Augmentation:: N/A Complications: None Delivery Type: spontaneous vaginal delivery Anesthesia:  non-pharmacological methods Placenta: spontaneous To Pathology: No   Prenatal Labs:  Blood type/Rh B pos  Antibody screen neg  Rubella Immune  Varicella Immune  RPR NR  HBsAg Neg  HIV NR  GC neg  Chlamydia neg  Genetic screening negative  1 hour GTT Checked blood sugars QID x 2wks  3 hour GTT    GBS positive    Magnesium  Sulfate received: No BMZ received: No Rhophylac:was not indicated MMR: was not indicated Varivax vaccine given: was not indicated - Tdap vaccine: Given prenatally - Flu vaccine: declined  -RSV vaccine: not in season   Transfusion:No  Physical exam  Vitals:   06/14/24 2127 06/14/24 2330 06/15/24 0250 06/15/24 0816  BP: 137/72 130/72 113/66 130/73  Pulse: 66 72 68 70  Resp:  18 18 20   Temp:  98 F (36.7 C) 97.7 F (36.5 C) 98.5 F (36.9 C)  TempSrc:  Oral Oral Oral  SpO2:  100% 98% 100%  Weight:      Height:       General: alert, cooperative, and no distress Lochia: appropriate Uterine Fundus: firm Perineum: minimal edema/intact DVT Evaluation: No evidence of DVT seen on physical exam.  Labs: Lab Results  Component Value Date   WBC 15.1 (H) 06/14/2024   HGB 9.1 (L) 06/14/2024   HCT 29.5 (L) 06/14/2024   MCV 78.5 (L) 06/14/2024   PLT 255 06/14/2024      Latest Ref Rng & Units 05/12/2024    9:19 AM  CMP  Glucose 70 - 99 mg/dL 88   BUN 6 - 20 mg/dL <5   Creatinine 9.55 - 1.00  mg/dL 9.59   Sodium 864 - 854 mmol/L 138   Potassium 3.5 - 5.1 mmol/L 3.8   Chloride 98 - 111 mmol/L 111   CO2 22 - 32 mmol/L 21   Calcium  8.9 - 10.3 mg/dL 8.0   Total Protein 6.5 - 8.1 g/dL 5.6   Total Bilirubin 0.0 - 1.2 mg/dL 0.5   Alkaline Phos 38 - 126 U/L 210   AST 15 - 41 U/L 19   ALT 0 - 44 U/L 28    Edinburgh Score:    06/13/2024   11:10 PM  Edinburgh Postnatal Depression Scale Screening Tool  I have been able to laugh and see the funny side of things. 1  I have looked forward with enjoyment to things. 0  I have blamed myself unnecessarily when  things went wrong. 2  I have been anxious or worried for no good reason. 2  I have felt scared or panicky for no good reason. 2  Things have been getting on top of me. 2  I have been so unhappy that I have had difficulty sleeping. 1  I have felt sad or miserable. 1  I have been so unhappy that I have been crying. 1  The thought of harming myself has occurred to me. 0  Edinburgh Postnatal Depression Scale Total 12    Risk assessment for postpartum VTE and prophylactic treatment: Very high risk factors: None High risk factors: None Moderate risk factors: BMI 30-40 kg/m2  Postpartum VTE prophylaxis with LMWH not indicated  After visit meds:  Allergies as of 06/15/2024       Reactions   Penicillins Hives        Medication List     STOP taking these medications    lamoTRIgine  25 MG tablet Commonly known as: LAMICTAL        TAKE these medications    acetaminophen  500 MG tablet Commonly known as: TYLENOL  Take 2 tablets (1,000 mg total) by mouth every 6 (six) hours as needed for mild pain (pain score 1-3) or fever.   benzocaine -Menthol  20-0.5 % Aero Commonly known as: DERMOPLAST Apply 1 Application topically as needed for irritation (perineal discomfort).   ferrous sulfate  325 (65 FE) MG tablet Take 1 tablet (325 mg total) by mouth 2 (two) times daily with a meal.   ibuprofen  600 MG tablet Commonly known as: ADVIL  Take 1 tablet (600 mg total) by mouth every 6 (six) hours as needed for fever, mild pain (pain score 1-3) or cramping.   metoprolol  tartrate 25 MG tablet Commonly known as: LOPRESSOR  Take 1 tablet (25 mg total) by mouth 2 (two) times daily.   prenatal multivitamin Tabs tablet Take 1 tablet by mouth daily at 12 noon.   senna-docusate 8.6-50 MG tablet Commonly known as: Senokot-S Take 2 tablets by mouth daily.   witch hazel-glycerin  pad Commonly known as: TUCKS Apply 1 Application topically as needed for hemorrhoids.       Discharge home in stable  condition Infant Feeding: Breast Infant Disposition:home with mother Discharge instruction: per After Visit Summary and Postpartum booklet. Activity: Advance as tolerated. Pelvic rest for 6 weeks.  Diet: routine diet Anticipated Birth Control:  Contraceptives: Tubal Ligation Postpartum Appointment:6 weeks Additional Postpartum F/U: Postpartum Depression checkup Future Appointments:No future appointments. Follow up Visit:  Follow-up Information     Leonce Garnette BIRCH, MD. Schedule an appointment as soon as possible for a visit in 2 week(s).   Specialty: Obstetrics and Gynecology Why: postpartum mood check, schedule interval BTL  Contact information: [address] [place] [address] [phone]         Vernel Therisa HERO, CNM. Schedule an appointment as soon as possible for a visit in 6 week(s).   Specialty: Certified Nurse Midwife Why: postpartum visit Contact information: [address] [place] [address] [phone]                 Plan:  ZETHA KUHAR was discharged to home in good condition. Follow-up appointment as directed.    Signed:  Edsel Charlies Blush, CNM 06/15/2024 10:24 AM

## 2024-06-13 NOTE — OB Triage Note (Signed)
 Patient is a 28 yo, G4P2, at 38 weeks and 2 days. Patient presents with complaints of contractions. Pt reports her contractions intensifying around 0430 today and also reports feeling a gush of fluid come out around and isn't sure if her water broke or not. Patient denies any vaginal bleeding. Patient reports +FM. Monitors applied and assessing. Initial fetal heart tone is 135. D. Wilson CNM aware of patients arrival to unit. Plan to do rom+ and labor eval

## 2024-06-14 LAB — CBC
HCT: 29.5 % — ABNORMAL LOW (ref 36.0–46.0)
Hemoglobin: 9.1 g/dL — ABNORMAL LOW (ref 12.0–15.0)
MCH: 24.2 pg — ABNORMAL LOW (ref 26.0–34.0)
MCHC: 30.8 g/dL (ref 30.0–36.0)
MCV: 78.5 fL — ABNORMAL LOW (ref 80.0–100.0)
Platelets: 255 K/uL (ref 150–400)
RBC: 3.76 MIL/uL — ABNORMAL LOW (ref 3.87–5.11)
RDW: 17.3 % — ABNORMAL HIGH (ref 11.5–15.5)
WBC: 15.1 K/uL — ABNORMAL HIGH (ref 4.0–10.5)
nRBC: 0 % (ref 0.0–0.2)

## 2024-06-14 MED ORDER — OXYCODONE HCL 5 MG PO TABS
5.0000 mg | ORAL_TABLET | ORAL | Status: DC | PRN
  Administered 2024-06-14 – 2024-06-15 (×3): 5 mg via ORAL
  Filled 2024-06-14 (×3): qty 1

## 2024-06-14 NOTE — Anesthesia Postprocedure Evaluation (Signed)
 Anesthesia Post Note  Patient: Brenda Fuentes  Procedure(s) Performed: AN AD HOC EPIDURAL  Patient location during evaluation: Mother Baby Anesthesia Type: Epidural Level of consciousness: awake and alert Pain management: pain level controlled Vital Signs Assessment: post-procedure vital signs reviewed and stable Respiratory status: spontaneous breathing, nonlabored ventilation and respiratory function stable Cardiovascular status: stable Postop Assessment: no headache, no backache, patient able to bend at knees and able to ambulate Anesthetic complications: no   No notable events documented.   Last Vitals:  Vitals:   06/14/24 0300 06/14/24 0733  BP: 131/83 117/72  Pulse: 74 66  Resp: 18 20  Temp: 36.5 C 36.5 C  SpO2: 100% 100%    Last Pain:  Vitals:   06/14/24 0846  TempSrc:   PainSc: 5                  Fairy POUR Amahia Madonia

## 2024-06-14 NOTE — Lactation Note (Signed)
 This note was copied from a baby's chart. Lactation Consultation Note  Patient Name: Brenda Fuentes Date: 06/14/2024 Age:26 hours Reason for consult: Initial assessment;Infant < 6lbs;Early term 37-38.6wks;RN request   Maternal Data This is mom's 3rd baby, SVD. Mom with history of anemia, POTS, gestational hypertension, anxiety, depression, multiple sclerosis, PPD with other 2 children, obesity and former tobacco use.  Observed breastfeeding session.  Has patient been taught Hand Expression?: Yes Does the patient have breastfeeding experience prior to this delivery?: Yes How long did the patient breastfeed?: Mom reports she exclusively breastfed for 2 months her 2nd child and she did not breastfeed her 1st child  Feeding Mother's Current Feeding Choice: Breast Milk Provided mom with tips and strategies to improve position and latch techniques. Mom and LC were able to easily identify baby's audible swallows. Baby with wet and stool diaper today. Mom encouraged to offer baby 2nd breast as baby continued to show feeding cues after feeding at the 1st breast. LATCH Score Latch: Grasps breast easily, tongue down, lips flanged, rhythmical sucking.  Audible Swallowing: Spontaneous and intermittent  Type of Nipple: Everted at rest and after stimulation  Comfort (Breast/Nipple): Filling, red/small blisters or bruises, mild/mod discomfort  Hold (Positioning): Assistance needed to correctly position infant at breast and maintain latch.  LATCH Score: 8   Lactation Tools Discussed/Used  Harmony manual pump, manual reports she has used this pump and did not need instruction on use. Reviewed breastmilk storage guidelines.  Interventions Interventions: Breast feeding basics reviewed;Assisted with latch;Hand express;Breast compression;Adjust position;Support pillows;Coconut oil;Hand pump;Education Reviewed what to expect in first days when breastfeeding: 8-12 feeds in 24 hours, feeding  cues, cluster feeding, waking a sleepy baby to feed, and how to know the baby is getting enough.  Discharge Discharge Education: Engorgement and breast care;Warning signs for feeding baby;Outpatient recommendation Pump: Hands Free (Mom has mom cozies pump. Mom requested a manual pump  which LC provided. Mom has used the [employer] pump before.)  Consult Status Consult Status: PRN    Brenda Fuentes 06/14/2024, 7:14 PM

## 2024-06-14 NOTE — Clinical Social Work Peds Assess (Signed)
 CLINICAL SOCIAL WORK PEDIATRIC ASSESSMENT NOTE  Patient Details  Name: Brenda Fuentes MRN: [id number] Date of Birth: 05-23-1996  Date:  06/14/2024  Clinical Social Worker Initiating Note:  [place] Henry Demeritt Date/Time: Initiated:  06/14/24/1444     Child's Name:  Brenda Fuentes   Biological Parents:  Mother   Need for Interpreter:  None   Reason for Referral:  Behavioral Health Concerns Selena)   Address:        Phone number:       Household Members:  Parents, Siblings   Natural Supports (not living in the home):  Extended Family, Immediate Family   Professional Supports:     Employment: Full-time   Type of Work: Writer:      Architect:  Media planner    Other Resources:      Cultural/Religious Considerations Which May Impact Care:  N/A  Strengths:      Risk Factors/Current Problems:  None   Cognitive State:  Alert     Mood/Affect:   CLINICAL SOCIAL WORK MATERNAL/CHILD NOTE  Patient Details  Name: Brenda Fuentes MRN: [id number] Date of Birth: February 10, 1996  Date:  06/14/2024  Clinical Social Worker Initiating Note:  [place] Jazarah Capili Date/Time: Initiated:  06/14/24/1444     Child's Name:  Brenda Fuentes   Biological Parents:  Mother   Need for Interpreter:  None   Reason for Referral:  Behavioral Health Concerns Selena)   Address:  [address] [place] [address]    Phone number:  [phone] (home)     Additional phone number: N/A  Household Members/Support Persons (HM/SP):   Household Member/Support Person 1   HM/SP Name Relationship DOB or Age  HM/SP -1 Jimmy Tassin Spouse    HM/SP -2        HM/SP -3        HM/SP -4        HM/SP -5        HM/SP -6        HM/SP -7        HM/SP -8          Natural Supports (not living in the home):  Extended Family, Immediate Family   Professional Supports:     Employment: Environmental education officer   Type of Work: Runner, broadcasting/film/video   Education:  Engineer, maintenance (IT)   Homebound  arranged:    Surveyor, quantity Resources:  Media planner    Other Resources:      Cultural/Religious Considerations Which May Impact Care:  N/A  Strengths:  Ability to meet basic needs  , Psychotropic Medications, Optometrist chosen, Home prepared for child  , Understanding of illness   Psychotropic Medications:         Pediatrician:    Merchant navy officer List:   [employer] [place]    [facility]  [facility]      Pediatrician Fax Number:    Risk Factors/Current Problems:  None   Cognitive State:  Alert     Mood/Affect:  Calm     CSW Assessment: CSW explained role and HIPAA. Mother was alert and calm. MOB confirmed address and telephone number. MOB is married and resides with spouse and two children ages 72 and 58. MOB reports that her natural supports are her parents and in-laws. MOB has a hx of anxiety depression and suspected mood disorder. 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 She  is currently engaged in therapeutic services. She is prescribed Lamictal  which she reports taking regularly.   Home is prepared for the baby. MOB reports having a bassinet, pack n play, car seat and other necessities. Safe sleep was discussed. MOB expressed an understanding. Resources were provided to her.   Baby will receive PCP from [facility] in [place]. Family has transportation and will be ready for discharge.   CSW Plan/Description:  No Further Intervention Required/No Barriers to Discharge      [place] L Paisyn Guercio, LCSW 06/14/2024, 2:55 PM

## 2024-06-14 NOTE — Progress Notes (Signed)
 In to see patient to discuss desire for ppBTL.  She is 100% sure that she wants no more children.   We discussed the ppBTL procedure with its inherent risks/benefits. We discussed alternative contraceptive methods that are just as good or better. She has tried Nexplanon and felt suicidal.  She has tried Paragard  and it came out of place.  Discussed interval BTL.  Discussed complete removal of the tubes this way and the contraceptive and cancer risk-reduction benefits.  She elects interval BTL.  Will schedule her for appt in 2 weeks to schedule. She prefers this approach. All quesitons answered.  Garnette Mace, MD, [facility] 06/14/2024 11:33 AM  i

## 2024-06-14 NOTE — Progress Notes (Signed)
 Postpartum Day  1  Subjective: 28 y.o. H5E7886 postpartum day #1 status post normal spontaneous vaginal delivery. She is ambulating, is tolerating po, is voiding spontaneously.  Her pain is not well controlled on PO pain medications.  Her lochia is less than menses.  Objective: BP 117/72 (BP Location: Right Arm)   Pulse 66   Temp 97.7 F (36.5 C) (Oral)   Resp 20   Ht 5' 6 (1.676 m)   Wt 105.2 kg   LMP 09/12/2023   SpO2 100%   Breastfeeding Unknown   BMI 37.45 kg/m    Physical Exam:  General: alert, cooperative, and appears stated age Breasts: soft/nontender Pulm: nl effort Abdomen: soft, non-tender, active bowel sounds Uterine Fundus: firm Perineum: minimal edema, intact Lochia: appropriate DVT Evaluation: No evidence of DVT seen on physical exam. Negative Homan's sign. No cords or calf tenderness. No significant calf/ankle edema.  Recent Labs    06/13/24 0652 06/14/24 0448  HGB 10.7* 9.1*  HCT 33.8* 29.5*  WBC 11.6* 15.1*  PLT 304 255    Assessment/Plan: 28 y.o. H5E7886 Postpartum Day  1  1. Continue routine postpartum care  2. Infant feeding status: breast feeding   3. Contraception plan: bilateral tubal ligation  4. Acute blood loss anemia - clinically significant.  --Hemodynamically stable and asymptomatic --Intervention: continue on oral supplementation with ferrous sulfate  325  5. Immunization status:   all immunizations up to date  6. Ordered oxycodone  prn for pain control  Disposition: continue inpatient postpartum care , plan for discharge home tomorrow    LOS: 1 day   Brenda Fuentes, CNM 06/14/2024, 2:27 PM   ----- Brenda Fuentes Certified Nurse Midwife [facility]

## 2024-06-14 NOTE — Clinical Social Work Maternal (Signed)
  CLINICAL SOCIAL WORK MATERNAL/CHILD NOTE  Patient Details  Name: Brenda Fuentes MRN: [id number] Date of Birth: Jun 12, 1996  Date:  06/14/2024  Clinical Social Worker Initiating Note:  [place] Maylon Sailors Date/Time: Initiated:  06/14/24/1444     Child's Name:  [name]   Biological Parents:  Mother   Need for Interpreter:  None   Reason for Referral:  Behavioral Health Concerns Selena)   Address:  [address] [place] [address]    Phone number:  [phone] (home)     Additional phone number: N/A  Household Members/Support Persons (HM/SP):   Household Member/Support Person 1   HM/SP Name Relationship DOB or Age  HM/SP -1 Jimmy Daddario Spouse    HM/SP -2        HM/SP -3        HM/SP -4        HM/SP -5        HM/SP -6        HM/SP -7        HM/SP -8          Natural Supports (not living in the home):  Extended Family, Immediate Family   Professional Supports:     Employment: Environmental education officer   Type of Work: Runner, broadcasting/film/video   Education:  Engineer, maintenance (IT)   Homebound arranged:    Surveyor, quantity Resources:  Media planner    Other Resources:      Cultural/Religious Considerations Which May Impact Care:  N/A  Strengths:  Ability to meet basic needs  , Psychotropic Medications, Optometrist chosen, Home prepared for child  , Understanding of illness   Psychotropic Medications:         Pediatrician:    Merchant navy officer List:   [employer] [place]    [facility]  [facility]      Pediatrician Fax Number:    Risk Factors/Current Problems:  None   Cognitive State:  Alert     Mood/Affect:  Calm     CSW Assessment: CSW explained role and HIPAA. Mother was alert and calm. MOB confirmed address and telephone number. MOB is married and resides with spouse and two children ages 70 and 53. MOB reports that her natural supports are her parents and in-laws. MOB has a hx of anxiety  depression and suspected mood disorder. 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 She is currently engaged in therapeutic services. She is prescribed Lamictal  which she reports taking regularly.   Home is prepared for the baby. MOB reports having a bassinet, pack n play, car seat and other necessities. Safe sleep was discussed. MOB expressed an understanding. Resources were provided to her.   Baby will receive PCP from [facility] in [place]. Family has transportation and will be ready for discharge.   CSW Plan/Description:  No Further Intervention Required/No Barriers to Discharge    [place] L Earlie Schank, LCSW 06/14/2024, 2:48 PM

## 2024-06-15 MED ORDER — FERROUS SULFATE 325 (65 FE) MG PO TABS: 325.0000 mg | ORAL_TABLET | Freq: Two times a day (BID) | ORAL | 3 refills | Status: DC

## 2024-06-15 MED ORDER — BENZOCAINE-MENTHOL 20-0.5 % EX AERO: 1.0000 | INHALATION_SPRAY | CUTANEOUS | 0 refills | Status: DC | PRN

## 2024-06-15 MED ORDER — WITCH HAZEL-GLYCERIN EX PADS: 1.0000 | MEDICATED_PAD | CUTANEOUS | 0 refills | Status: DC | PRN

## 2024-06-15 MED ORDER — IBUPROFEN 600 MG PO TABS: 600.0000 mg | ORAL_TABLET | Freq: Four times a day (QID) | ORAL | 0 refills | Status: DC | PRN

## 2024-06-15 MED ORDER — ACETAMINOPHEN 500 MG PO TABS: 1000.0000 mg | ORAL_TABLET | Freq: Four times a day (QID) | ORAL | 0 refills | Status: DC | PRN

## 2024-06-15 MED ORDER — SENNOSIDES-DOCUSATE SODIUM 8.6-50 MG PO TABS: 2.0000 | ORAL_TABLET | Freq: Every day | ORAL | 0 refills | Status: DC

## 2024-06-15 NOTE — Lactation Note (Signed)
 This note was copied from a baby's chart. Lactation Consultation Note  Patient Name: Girl Mykenna Viele Unijb'd Date: 06/15/2024 Age:26 hours Reason for consult: Follow-up assessment;Early term 37-38.6wks;Infant < 6lbs;Maternal discharge   Maternal Data Does the patient have breastfeeding experience prior to this delivery?: Yes How long did the patient breastfeed?: 2 mths  Feeding Mother's Current Feeding Choice: Breast Milk and Formula I did not observe baby feeding at the breast, mom is awaiting discharge, offered my assistance for any questions or concerns, mom reports that she does not have any. LATCH Score Latch:  (I did not observe a feeding at the breast)                  Lactation Tools Discussed/Used    Interventions  LC name updated on white board  Discharge Pump: Personal  Consult Status Consult Status: PRN    Aldona JONETTA Converse 06/15/2024, 12:36 PM

## 2024-08-09 ENCOUNTER — Encounter
Admission: RE | Admit: 2024-08-09 | Discharge: 2024-08-09 | Disposition: A | Discharge status: Home / Self Care | Source: Ambulatory Visit | Attending: Obstetrics and Gynecology | Admitting: Obstetrics and Gynecology

## 2024-08-09 ENCOUNTER — Other Ambulatory Visit: Payer: Self-pay

## 2024-08-09 DIAGNOSIS — Z01812 Encounter for preprocedural laboratory examination: Secondary | ICD-10-CM

## 2024-08-09 DIAGNOSIS — D649 Anemia, unspecified: Secondary | ICD-10-CM

## 2024-08-09 HISTORY — DX: Headache, unspecified: R51.9

## 2024-08-09 HISTORY — DX: Anemia, unspecified: D64.9

## 2024-08-09 HISTORY — DX: Family history of other specified conditions: Z84.89

## 2024-08-09 NOTE — Patient Instructions (Addendum)
 Your procedure is scheduled on: 08/16/24 - Friday Report to the Registration Desk on the 1st floor of the [facility]. To find out your arrival time, please call [phone] between 1PM - 3PM on: 08/15/24 - Thursday If your arrival time is 6:00 am, do not arrive before that time as the [facility] entrance doors do not open until 6:00 am.  REMEMBER: Instructions that are not followed completely may result in serious medical risk, up to and including death; or upon the discretion of your surgeon and anesthesiologist your surgery may need to be rescheduled.  Do not eat food after midnight the night before surgery.  No gum chewing or hard candies.  You may however, drink CLEAR liquids up to 2 hours before you are scheduled to arrive for your surgery. Do not drink anything within 2 hours of your scheduled arrival time.  Clear liquids include: - water  - apple juice without pulp - gatorade (not RED colors) - black coffee or tea (Do NOT add milk or creamers to the coffee or tea) Do NOT drink anything that is not on this list.  One week prior to surgery: Stop Anti-inflammatories (NSAIDS) such as Advil , Aleve, Ibuprofen , Motrin , Naproxen, Naprosyn and Aspirin  based products such as Excedrin, Goody's Powder, BC Powder. You may continue to take Tylenol  if needed for pain up until the day of surgery.  Stop ANY OVER THE COUNTER supplements until after surgery.  ON THE DAY OF SURGERY ONLY TAKE THESE MEDICATIONS WITH SIPS OF WATER:  metoprolol  tartrate    No Alcohol for 24 hours before or after surgery.  No Smoking including e-cigarettes for 24 hours before surgery.  No chewable tobacco products for at least 6 hours before surgery.  No nicotine  patches on the day of surgery.  Do not use any recreational drugs for at least a week (preferably 2 weeks) before your surgery.  Please be advised that the combination of cocaine and anesthesia may have negative outcomes, up to and including  death. If you test positive for cocaine, your surgery will be cancelled.  On the morning of surgery brush your teeth with toothpaste and water, you may rinse your mouth with mouthwash if you wish. Do not swallow any toothpaste or mouthwash.  Use CHG Soap or wipes as directed on instruction sheet.  Do not wear jewelry, make-up, hairpins, clips or nail polish.  For welded (permanent) jewelry: bracelets, anklets, waist bands, etc.  Please have this removed prior to surgery.  If it is not removed, there is a chance that hospital personnel will need to cut it off on the day of surgery.  Do not wear lotions, powders, or perfumes.   Do not shave body hair from the neck down 48 hours before surgery.  Contact lenses, hearing aids and dentures may not be worn into surgery.  Do not bring valuables to the hospital. [facility] is not responsible for any missing/lost belongings or valuables.   Notify your doctor if there is any change in your medical condition (cold, fever, infection).  Wear comfortable clothing (specific to your surgery type) to the hospital.  After surgery, you can help prevent lung complications by doing breathing exercises.  Take deep breaths and cough every 1-2 hours. Your doctor may order a device called an Incentive Spirometer to help you take deep breaths.  When coughing or sneezing, hold a pillow firmly against your incision with both hands. This is called "splinting." Doing this helps protect your incision. It also decreases  belly discomfort.  If you are being admitted to the hospital overnight, leave your suitcase in the car. After surgery it may be brought to your room.  In case of increased patient census, it may be necessary for you, the patient, to continue your postoperative care in the Same Day Surgery department.  If you are being discharged the day of surgery, you will not be allowed to drive home. You will need a responsible individual to drive you home and  stay with you for 24 hours after surgery.   If you are taking public transportation, you will need to have a responsible individual with you.  Please call the Pre-admissions Testing Dept. at [phone] if you have any questions about these instructions.  Surgery Visitation Policy:  Patients having surgery or a procedure may have two visitors.  Children under the age of 54 must have an adult with them who is not the patient.  Inpatient Visitation:    Visiting hours are 7 a.m. to 8 p.m. Up to four visitors are allowed at one time in a patient room. The visitors may rotate out with other people during the day.  One visitor age 75 or older may stay with the patient overnight and must be in the room by 8 p.m.   Merchandiser, retail to address health-related social needs:  [email or web address]                                                                                                             Preparing for Surgery with CHLORHEXIDINE GLUCONATE (CHG) Soap  Chlorhexidine Gluconate (CHG) Soap  o An antiseptic cleaner that kills germs and bonds with the skin to continue killing germs even after washing  o Used for showering the night before surgery and morning of surgery  Before surgery, you can play an important role by reducing the number of germs on your skin.  CHG (Chlorhexidine gluconate) soap is an antiseptic cleanser which kills germs and bonds with the skin to continue killing germs even after washing.  Please do not use if you have an allergy to CHG or antibacterial soaps. If your skin becomes reddened/irritated stop using the CHG.  1. Shower the NIGHT BEFORE SURGERY and the MORNING OF SURGERY with CHG soap.  2. If you choose to wash your hair, wash your hair first as usual with your normal shampoo.  3. After shampooing, rinse your hair and body thoroughly to remove the shampoo.  4. Use CHG as you would any other liquid soap. You can apply CHG  directly to the skin and wash gently with a scrungie or a clean washcloth.  5. Apply the CHG soap to your body only from the neck down. Do not use on open wounds or open sores. Avoid contact with your eyes, ears, mouth, and genitals (private parts). Wash face and genitals (private parts) with your normal soap.  6. Wash thoroughly, paying special attention to the area where your surgery will be performed.  7. Thoroughly rinse your body with warm water.  8. Do not shower/wash with your normal soap after using and rinsing off the CHG soap.  9. Pat yourself dry with a clean towel.  10. Wear clean pajamas to bed the night before surgery.  12. Place clean sheets on your bed the night of your first shower and do not sleep with pets.  13. Shower again with the CHG soap on the day of surgery prior to arriving at the hospital.  14. Do not apply any deodorants/lotions/powders.  15. Please wear clean clothes to the hospital.

## 2024-08-12 NOTE — H&P (Signed)
 Preoperative History and Physical  Brenda Fuentes is a 28 y.o. [phone] here for surgical management of desire to have no more children.   No significant preoperative concerns.  History of Present Illness: 28 y.o. [phone] female who is 4 weeks postpartum from an uncomplicated SVD on 06/13/24. She had desired a postpartum BTL. After counseling she decided to choose an interval BTL due potential ovarian cancer risk-reduction benefits. Her lochia is mostly stopped at this point. She notes some random bleeding. She notes no other issues since delivery.   Proposed surgery: Laparoscopic bilateral salpingectomy  Past Medical History:  Diagnosis Date   Anemia    Anxiety    Depression 2016   Family history of adverse reaction to anesthesia    sister had issues but not sure what happen   Gestational hypertension    Headache    Hypercholesteremia 2009   Multiple sclerosis (HCC)    POTS (postural orthostatic tachycardia syndrome)    TB lung, latent    Past Surgical History:  Procedure Laterality Date   NO PAST SURGERIES     WISDOM TOOTH EXTRACTION     OB History  Gravida Para Term Preterm AB Living  4 3 2 1 1 3   SAB IAB Ectopic Multiple Live Births  1   0 3    # Outcome Date GA Lbr Len/2nd Weight Sex Type Anes PTL Lv  4 Term 06/13/24 [redacted]w[redacted]d 08:03 / 00:09 2660 g F Vag-Spont EPI  LIV  3 SAB 07/2023 [redacted]w[redacted]d         2 Term 08/20/19 [redacted]w[redacted]d 04:36 / 00:18 3010 g F Vag-Spont EPI  LIV  1 Preterm 07/04/16 [redacted]w[redacted]d 14:25 / 00:23 2560 g M Vag-Spont EPI  LIV  Patient denies any other pertinent gynecologic issues.   No current facility-administered medications on file prior to encounter.   Current Outpatient Medications on File Prior to Encounter  Medication Sig Dispense Refill   metoprolol  tartrate (LOPRESSOR ) 25 MG tablet Take 1 tablet (25 mg total) by mouth 2 (two) times daily. 60 tablet 1   Allergies  Allergen Reactions   Penicillins Hives    Social History:   reports that she quit smoking about  6 years ago. Her smoking use included cigarettes. She has never used smokeless tobacco. She reports current alcohol use. She reports that she does not use drugs.  Family History  Problem Relation Age of Onset   Hyperlipidemia Mother    Heart failure Father    Diabetes Father     Review of Systems: Noncontributory  PHYSICAL EXAM: unknown if currently breastfeeding. CONSTITUTIONAL: Well-developed, well-nourished female in no acute distress.  HENT:  Normocephalic, atraumatic, External right and left ear normal. Oropharynx is clear and moist EYES: Conjunctivae and EOM are normal. Pupils are equal, round, and reactive to light. No scleral icterus.  NECK: Normal range of motion, supple, no masses SKIN: Skin is warm and dry. No rash noted. Not diaphoretic. No erythema. No pallor. NEUROLGIC: Alert and oriented to person, place, and time. Normal reflexes, muscle tone coordination. No cranial nerve deficit noted. PSYCHIATRIC: Normal mood and affect. Normal behavior. Normal judgment and thought content. CARDIOVASCULAR: Normal heart rate noted, regular rhythm RESPIRATORY: Effort and breath sounds normal, no problems with respiration noted ABDOMEN: Soft, nontender, nondistended. PELVIC: Deferred MUSCULOSKELETAL: Normal range of motion. No edema and no tenderness. 2+ distal pulses.  Labs: No results found for this or any previous visit (from the past 2 weeks).  Imaging Studies: No results found.  Assessment:  Sterilization  Plan: Patient will undergo surgical management with the above-noted surgery.   The risks of surgery were discussed in detail with the patient including but not limited to: bleeding which may require transfusion or reoperation; infection which may require antibiotics; injury to surrounding organs which may involve bowel, bladder, ureters ; need for additional procedures including laparoscopy or laparotomy; thromboembolic phenomenon, surgical site problems and other  postoperative/anesthesia complications. Likelihood of success in alleviating the patient's condition was discussed. Routine postoperative instructions will be reviewed with the patient and her family in detail after surgery.  The patient concurred with the proposed plan, giving informed written consent for the surgery.  Patient has been NPO since last night she will remain NPO for procedure.  Anesthesia and OR aware.  Preoperative prophylactic antibiotics, as indicated, and SCDs ordered on call to the OR.  To OR when ready.  28 y.o. [id number] with undesired fertility, desires permanent sterilization. Other reversible forms of contraception were discussed with patient; she declines all other modalities. Permanent nature of as well as associated risks of the procedure discussed with patient including but not limited to: risk of regret, permanence of method, bleeding, infection, injury to surrounding organs and need for additional procedures. Failure risk is not as well established with salpingectomy.   Garnette Mace, MD 08/12/2024 1:37 PM

## 2024-08-13 ENCOUNTER — Inpatient Hospital Stay: Admission: RE | Admit: 2024-08-13 | Source: Ambulatory Visit

## 2024-08-14 ENCOUNTER — Encounter
Admission: RE | Admit: 2024-08-14 | Discharge: 2024-08-14 | Disposition: A | Discharge status: Home / Self Care | Source: Ambulatory Visit | Attending: Obstetrics and Gynecology | Admitting: Obstetrics and Gynecology

## 2024-08-14 DIAGNOSIS — Z01812 Encounter for preprocedural laboratory examination: Secondary | ICD-10-CM | POA: Diagnosis present

## 2024-08-14 LAB — CBC WITH DIFFERENTIAL/PLATELET
Abs Immature Granulocytes: 0.02 K/uL (ref 0.00–0.07)
Basophils Absolute: 0 K/uL (ref 0.0–0.1)
Basophils Relative: 0 %
Eosinophils Absolute: 0.1 K/uL (ref 0.0–0.5)
Eosinophils Relative: 2 %
HCT: 34.6 % — ABNORMAL LOW (ref 36.0–46.0)
Hemoglobin: 10.9 g/dL — ABNORMAL LOW (ref 12.0–15.0)
Immature Granulocytes: 0 %
Lymphocytes Relative: 21 %
Lymphs Abs: 1.5 K/uL (ref 0.7–4.0)
MCH: 24.5 pg — ABNORMAL LOW (ref 26.0–34.0)
MCHC: 31.5 g/dL (ref 30.0–36.0)
MCV: 77.9 fL — ABNORMAL LOW (ref 80.0–100.0)
Monocytes Absolute: 0.9 K/uL (ref 0.1–1.0)
Monocytes Relative: 13 %
Neutro Abs: 4.6 K/uL (ref 1.7–7.7)
Neutrophils Relative %: 64 %
Platelets: 359 K/uL (ref 150–400)
RBC: 4.44 MIL/uL (ref 3.87–5.11)
RDW: 15.9 % — ABNORMAL HIGH (ref 11.5–15.5)
WBC: 7.2 K/uL (ref 4.0–10.5)
nRBC: 0 % (ref 0.0–0.2)

## 2024-08-16 ENCOUNTER — Encounter: Payer: Self-pay | Admitting: Obstetrics and Gynecology

## 2024-08-16 ENCOUNTER — Ambulatory Visit
Admission: RE | Admit: 2024-08-16 | Discharge: 2024-08-16 | Disposition: A | Discharge status: Home / Self Care | Attending: Obstetrics and Gynecology | Admitting: Obstetrics and Gynecology

## 2024-08-16 ENCOUNTER — Encounter
Admission: RE | Disposition: A | Discharge status: Home / Self Care | Payer: Self-pay | Source: Home / Self Care | Attending: Obstetrics and Gynecology

## 2024-08-16 ENCOUNTER — Ambulatory Visit: Payer: Self-pay

## 2024-08-16 ENCOUNTER — Other Ambulatory Visit: Payer: Self-pay

## 2024-08-16 ENCOUNTER — Ambulatory Visit: Payer: Self-pay | Admitting: Urgent Care

## 2024-08-16 DIAGNOSIS — E7800 Pure hypercholesterolemia, unspecified: Secondary | ICD-10-CM | POA: Insufficient documentation

## 2024-08-16 DIAGNOSIS — Z302 Encounter for sterilization: Secondary | ICD-10-CM | POA: Insufficient documentation

## 2024-08-16 DIAGNOSIS — D649 Anemia, unspecified: Secondary | ICD-10-CM

## 2024-08-16 DIAGNOSIS — Z87891 Personal history of nicotine dependence: Secondary | ICD-10-CM | POA: Insufficient documentation

## 2024-08-16 DIAGNOSIS — G35 Multiple sclerosis: Secondary | ICD-10-CM | POA: Diagnosis not present

## 2024-08-16 LAB — POCT PREGNANCY, URINE: Preg Test, Ur: NEGATIVE

## 2024-08-16 SURGERY — SALPINGECTOMY, BILATERAL, LAPAROSCOPIC
Anesthesia: General | Site: Abdomen | Laterality: Bilateral

## 2024-08-16 MED ORDER — LACTATED RINGERS IV SOLN: INTRAVENOUS | Status: DC

## 2024-08-16 MED ORDER — LIDOCAINE HCL (CARDIAC) PF 100 MG/5ML IV SOSY
PREFILLED_SYRINGE | INTRAVENOUS | Status: DC | PRN
  Administered 2024-08-16: 100 mg via INTRAVENOUS

## 2024-08-16 MED ORDER — DEXAMETHASONE SODIUM PHOSPHATE 10 MG/ML IJ SOLN
INTRAMUSCULAR | Status: DC | PRN
  Administered 2024-08-16: 10 mg via INTRAVENOUS

## 2024-08-16 MED ORDER — PROPOFOL 1000 MG/100ML IV EMUL
INTRAVENOUS | Status: AC
  Filled 2024-08-16: qty 100

## 2024-08-16 MED ORDER — ONDANSETRON HCL 4 MG/2ML IJ SOLN
INTRAMUSCULAR | Status: DC | PRN
  Administered 2024-08-16 (×2): 4 mg via INTRAVENOUS

## 2024-08-16 MED ORDER — OXYCODONE HCL 5 MG PO TABS
5.0000 mg | ORAL_TABLET | Freq: Once | ORAL | Status: AC | PRN
  Administered 2024-08-16: 5 mg via ORAL

## 2024-08-16 MED ORDER — SUGAMMADEX SODIUM 200 MG/2ML IV SOLN
INTRAVENOUS | Status: DC | PRN
  Administered 2024-08-16: 200 mg via INTRAVENOUS

## 2024-08-16 MED ORDER — IBUPROFEN 600 MG PO TABS: 600.0000 mg | ORAL_TABLET | Freq: Four times a day (QID) | ORAL | 0 refills | Status: AC

## 2024-08-16 MED ORDER — CHLORHEXIDINE GLUCONATE 0.12 % MT SOLN
15.0000 mL | Freq: Once | OROMUCOSAL | Status: AC
  Administered 2024-08-16: 15 mL via OROMUCOSAL

## 2024-08-16 MED ORDER — GLYCOPYRROLATE 0.2 MG/ML IJ SOLN
INTRAMUSCULAR | Status: DC | PRN
  Administered 2024-08-16 (×2): .2 mg via INTRAVENOUS

## 2024-08-16 MED ORDER — FENTANYL CITRATE (PF) 100 MCG/2ML IJ SOLN
INTRAMUSCULAR | Status: AC
  Filled 2024-08-16: qty 2

## 2024-08-16 MED ORDER — 0.9 % SODIUM CHLORIDE (POUR BTL) OPTIME
TOPICAL | Status: DC | PRN
  Administered 2024-08-16: 500 mL

## 2024-08-16 MED ORDER — MIDAZOLAM HCL 2 MG/2ML IJ SOLN
INTRAMUSCULAR | Status: AC
  Filled 2024-08-16: qty 2

## 2024-08-16 MED ORDER — FENTANYL CITRATE (PF) 100 MCG/2ML IJ SOLN
INTRAMUSCULAR | Status: DC | PRN
  Administered 2024-08-16 (×2): 50 ug via INTRAVENOUS

## 2024-08-16 MED ORDER — OXYCODONE HCL 5 MG/5ML PO SOLN: 5.0000 mg | Freq: Once | ORAL | Status: AC | PRN

## 2024-08-16 MED ORDER — BUPIVACAINE HCL 0.5 % IJ SOLN
INTRAMUSCULAR | Status: DC | PRN
  Administered 2024-08-16: 9 mL

## 2024-08-16 MED ORDER — MIDAZOLAM HCL 2 MG/2ML IJ SOLN
INTRAMUSCULAR | Status: DC | PRN
  Administered 2024-08-16: 2 mg via INTRAVENOUS

## 2024-08-16 MED ORDER — PROPOFOL 10 MG/ML IV BOLUS
INTRAVENOUS | Status: DC | PRN
  Administered 2024-08-16: 150 mg via INTRAVENOUS
  Administered 2024-08-16: 20 mg via INTRAVENOUS

## 2024-08-16 MED ORDER — CHLORHEXIDINE GLUCONATE 0.12 % MT SOLN
OROMUCOSAL | Status: AC
  Filled 2024-08-16: qty 15

## 2024-08-16 MED ORDER — HYDROCODONE-ACETAMINOPHEN 5-325 MG PO TABS: 1.0000 | ORAL_TABLET | Freq: Four times a day (QID) | ORAL | 0 refills | Status: DC | PRN

## 2024-08-16 MED ORDER — ORAL CARE MOUTH RINSE: 15.0000 mL | Freq: Once | OROMUCOSAL | Status: AC

## 2024-08-16 MED ORDER — FENTANYL CITRATE (PF) 100 MCG/2ML IJ SOLN
25.0000 ug | INTRAMUSCULAR | Status: DC | PRN
  Administered 2024-08-16 (×4): 25 ug via INTRAVENOUS

## 2024-08-16 MED ORDER — DEXMEDETOMIDINE HCL IN NACL 200 MCG/50ML IV SOLN
INTRAVENOUS | Status: DC | PRN
  Administered 2024-08-16: 12 ug via INTRAVENOUS
  Administered 2024-08-16: 8 ug via INTRAVENOUS
  Administered 2024-08-16: 12 ug via INTRAVENOUS

## 2024-08-16 MED ORDER — OXYCODONE HCL 5 MG PO TABS
ORAL_TABLET | ORAL | Status: AC
  Filled 2024-08-16: qty 1

## 2024-08-16 MED ORDER — ACETAMINOPHEN 10 MG/ML IV SOLN
INTRAVENOUS | Status: DC | PRN
  Administered 2024-08-16: 1000 mg via INTRAVENOUS

## 2024-08-16 MED ORDER — BUPIVACAINE HCL (PF) 0.5 % IJ SOLN
INTRAMUSCULAR | Status: AC
  Filled 2024-08-16: qty 30

## 2024-08-16 MED ORDER — ROCURONIUM BROMIDE 100 MG/10ML IV SOLN
INTRAVENOUS | Status: DC | PRN
  Administered 2024-08-16: 40 mg via INTRAVENOUS
  Administered 2024-08-16: 10 mg via INTRAVENOUS

## 2024-08-16 MED ORDER — SUCCINYLCHOLINE CHLORIDE 200 MG/10ML IV SOSY
PREFILLED_SYRINGE | INTRAVENOUS | Status: DC | PRN
  Administered 2024-08-16: 100 mg via INTRAVENOUS

## 2024-08-16 MED ORDER — KETOROLAC TROMETHAMINE 30 MG/ML IJ SOLN
INTRAMUSCULAR | Status: DC | PRN
  Administered 2024-08-16: 30 mg via INTRAVENOUS

## 2024-08-16 SURGICAL SUPPLY — 35 items
BAG URINE DRAIN 2000ML AR STRL (UROLOGICAL SUPPLIES) ×2 IMPLANT
BLADE SURG SZ11 CARB STEEL (BLADE) ×2 IMPLANT
CATH URTH 16FR FL 2W BLN LF (CATHETERS) ×2 IMPLANT
CHLORAPREP W/TINT 26 (MISCELLANEOUS) ×1 IMPLANT
DEFOGGER SCOPE WARM SEASHARP (MISCELLANEOUS) ×2 IMPLANT
DERMABOND ADVANCED .7 DNX12 (GAUZE/BANDAGES/DRESSINGS) ×2 IMPLANT
DRAPE LEGGINS SURG 28X43 STRL (DRAPES) ×2 IMPLANT
DRAPE SHEET LG 3/4 BI-LAMINATE (DRAPES) ×2 IMPLANT
DRAPE UNDER BUTTOCK W/FLU (DRAPES) ×2 IMPLANT
GLOVE BIO SURGEON STRL SZ7 (GLOVE) ×4 IMPLANT
GLOVE BIOGEL PI IND STRL 7.5 (GLOVE) ×2 IMPLANT
GOWN STRL REUS W/ TWL LRG LVL3 (GOWN DISPOSABLE) ×4 IMPLANT
GRASPER SUT TROCAR 14GX15 (MISCELLANEOUS) ×1 IMPLANT
KIT PINK PAD W/HEAD ARM REST (MISCELLANEOUS) ×2 IMPLANT
KIT TURNOVER CYSTO (KITS) ×2 IMPLANT
LABEL OR SOLS (LABEL) ×1 IMPLANT
LIGASURE VESSEL 5MM BLUNT TIP (ELECTROSURGICAL) ×1 IMPLANT
MANIFOLD NEPTUNE II (INSTRUMENTS) ×2 IMPLANT
NDL HYPO 22X1.5 SAFETY MO (MISCELLANEOUS) ×1 IMPLANT
NEEDLE HYPO 22X1.5 SAFETY MO (MISCELLANEOUS) ×2 IMPLANT
NS IRRIG 500ML POUR BTL (IV SOLUTION) ×2 IMPLANT
PACK LAP CHOLECYSTECTOMY (MISCELLANEOUS) ×2 IMPLANT
PAD OB MATERNITY 11 LF (PERSONAL CARE ITEMS) ×2 IMPLANT
PAD PREP OB/GYN DISP 24X41 (PERSONAL CARE ITEMS) ×2 IMPLANT
SCRUB CHG 4% DYNA-HEX 4OZ (MISCELLANEOUS) ×2 IMPLANT
SET TUBE SMOKE EVAC HIGH FLOW (TUBING) ×2 IMPLANT
SLEEVE Z-THREAD 5X100MM (TROCAR) ×2 IMPLANT
SOLUTION PREP PVP 2OZ (MISCELLANEOUS) ×1 IMPLANT
SURGILUBE 2OZ TUBE FLIPTOP (MISCELLANEOUS) ×2 IMPLANT
SUT VIC AB 2-0 UR6 27 (SUTURE) ×1 IMPLANT
SUTURE MNCRL 4-0 27XMF (SUTURE) ×1 IMPLANT
TRAP FLUID SMOKE EVACUATOR (MISCELLANEOUS) ×2 IMPLANT
TROCAR Z-THRD FIOS HNDL 11X100 (TROCAR) ×1 IMPLANT
TROCAR Z-THREAD FIOS 5X100MM (TROCAR) ×2 IMPLANT
WATER STERILE IRR 500ML POUR (IV SOLUTION) ×1 IMPLANT

## 2024-08-16 NOTE — Anesthesia Preprocedure Evaluation (Signed)
 Anesthesia Evaluation  Patient identified by MRN, date of birth, ID band Patient awake    Reviewed: Allergy & Precautions, H&P , NPO status , Patient's Chart, lab work & pertinent test results  Airway Mallampati: II  TM Distance: >3 FB Neck ROM: full    Dental no notable dental hx. (+) Teeth Intact   Pulmonary neg pulmonary ROS, former smoker   Pulmonary exam normal breath sounds clear to auscultation       Cardiovascular negative cardio ROS Normal cardiovascular exam Rhythm:regular Rate:Normal     Neuro/Psych  PSYCHIATRIC DISORDERS Anxiety Depression    Pt w/multiple sclerosis, no currently receiving treatment  Neuromuscular disease negative neurological ROS  negative psych ROS   GI/Hepatic negative GI ROS, Neg liver ROS,,,  Endo/Other  negative endocrine ROS    Renal/GU      Musculoskeletal   Abdominal   Peds  Hematology negative hematology ROS (+)   Anesthesia Other Findings Past Medical History: No date: Anemia No date: Anxiety 2016: Depression No date: Family history of adverse reaction to anesthesia     Comment:  sister had issues but not sure what happen No date: Gestational hypertension No date: Headache 2009: Hypercholesteremia No date: Multiple sclerosis (HCC) No date: POTS (postural orthostatic tachycardia syndrome) No date: TB lung, latent  Past Surgical History: No date: NO PAST SURGERIES No date: WISDOM TOOTH EXTRACTION  BMI    Body Mass Index: 34.38 kg/m      Reproductive/Obstetrics negative OB ROS                              Anesthesia Physical Anesthesia Plan  ASA: 2  Anesthesia Plan: General ETT   Post-op Pain Management:    Induction: Intravenous  PONV Risk Score and Plan: 4 or greater and Ondansetron , Dexamethasone , Midazolam , Propofol  infusion and TIVA  Airway Management Planned: Oral ETT  Additional Equipment:   Intra-op Plan:    Post-operative Plan: Extubation in OR  Informed Consent: I have reviewed the patients History and Physical, chart, labs and discussed the procedure including the risks, benefits and alternatives for the proposed anesthesia with the patient or authorized representative who has indicated his/her understanding and acceptance.     Dental Advisory Given  Plan Discussed with: Anesthesiologist, CRNA and Surgeon  Anesthesia Plan Comments: (Patient consented for risks of anesthesia including but not limited to:  - adverse reactions to medications - damage to eyes, teeth, lips or other oral mucosa - nerve damage due to positioning  - sore throat or hoarseness - Damage to heart, brain, nerves, lungs, other parts of body or loss of life  Patient voiced understanding and assent.)        Anesthesia Quick Evaluation

## 2024-08-16 NOTE — Interval H&P Note (Signed)
 History and Physical Interval Note:  08/16/2024 7:18 AM  Brenda Fuentes Finder  has presented today for surgery, with the diagnosis of sterilization.  The various methods of treatment have been discussed with the patient and family. After consideration of risks, benefits and other options for treatment, the patient has consented to  Procedure(s): LIGATION, FALLOPIAN TUBE, LAPAROSCOPIC (Bilateral) as a surgical intervention, by way of bilateral salpingectomy.  The patient's history has been reviewed, patient examined, no change in status, stable for surgery.  I have reviewed the patient's chart and labs.  Questions were answered to the patient's satisfaction.    Garnette Mace, MD, [facility] 08/16/2024 7:18 AM

## 2024-08-16 NOTE — Anesthesia Postprocedure Evaluation (Signed)
 Anesthesia Post Note  Patient: Brenda Fuentes  Procedure(s) Performed: SALPINGECTOMY, BILATERAL, LAPAROSCOPIC (Bilateral: Abdomen)  Patient location during evaluation: PACU Anesthesia Type: General Level of consciousness: awake and alert Pain management: pain level controlled Vital Signs Assessment: post-procedure vital signs reviewed and stable Respiratory status: spontaneous breathing, nonlabored ventilation, respiratory function stable and patient connected to nasal cannula oxygen Cardiovascular status: blood pressure returned to baseline and stable Postop Assessment: no apparent nausea or vomiting Anesthetic complications: no   No notable events documented.   Last Vitals:  Vitals:   08/16/24 0950 08/16/24 1009  BP:  107/71  Pulse: 63 66  Resp: 18 14  Temp: (!) 36.1 C   SpO2: 96% 99%    Last Pain:  Vitals:   08/16/24 1009  TempSrc:   PainSc: 4                  Debby Mines

## 2024-08-16 NOTE — Anesthesia Procedure Notes (Signed)
 Procedure Name: Intubation Date/Time: 08/16/2024 7:34 AM  Performed by: Ledora Duncan, CRNAPre-anesthesia Checklist: Patient identified, Emergency Drugs available, Suction available and Patient being monitored Patient Re-evaluated:Patient Re-evaluated prior to induction Oxygen Delivery Method: Circle system utilized Preoxygenation: Pre-oxygenation with 100% oxygen Induction Type: IV induction Ventilation: Mask ventilation without difficulty Laryngoscope Size: McGrath and 3 Grade View: Grade I Tube type: Oral Tube size: 7.0 mm Number of attempts: 1 Airway Equipment and Method: Stylet Placement Confirmation: ETT inserted through vocal cords under direct vision, positive ETCO2 and breath sounds checked- equal and bilateral Secured at: 21 cm Tube secured with: Tape Dental Injury: Teeth and Oropharynx as per pre-operative assessment

## 2024-08-16 NOTE — Op Note (Signed)
  Operative Note    Name: Brenda Fuentes Finder  Date of Service: 08/16/2024  DOB: 1996/02/03  MRN: [id number]   Pre-Operative Diagnosis: Multiparity, desires permanent sterilization  Post-Operative Diagnosis: Multiparity, desires permanent sterilization  Procedures:  Laparoscopic bilateral salpingectomy  Primary Surgeon: Garnette Mace, MD   EBL: 5 mL   IVF: 700 mL   Urine output: 200 mL clear urine at end of case  Specimens: bilateral fallopian tubes  Drains: none  Complications: None   Disposition: PACU   Condition: Stable   Findings: normal appearing uterus, bilateral fallopian tubes, and ovaries  Procedure Summary:  The patient was taken to the operating room where general anesthesia was administered and found to be adequate. She was placed in the dorsal supine lithotomy position in Argenta stirrups and prepped and draped in usual sterile fashion. After a timeout was called an indwelling catheter was placed in her bladder. A sponge-on-a-stick was placed in the vagina for uterine manipulation.    Attention was turned to the abdomen where after injection of local anesthetic, a 5 mm infraumbilical incision was made with the scalpel. Entry into the abdomen was obtained via Optiview trocar technique (a blunt entry technique with camera visualization through the obturator upon entry). Verification of entry into the abdomen was obtained using opening pressures. The abdomen was insufflated with CO2. The camera was introduced through the trocar with verification of atraumatic entry.  A 5 mm suprapubic port site was created under direct intra-abdominal camera visualization without difficulty.  A left lower quadrant 5 mm port site was created under direct intra-abdominal camera visualization without difficulty.   The left fallopian tube was grasped at the fimbriated end and using the 5 mm LigaSure device the mesosalpinx was cauterized and transected in a lateral to medial fashion.  The  fallopian tube was removed in its entirety through the 5 mm port.  The same procedure was carried out on the right fallopian tube without difficulty.  Hemostasis was noted along the transection line.  All instruments were removed.  The air was emptied of CO2 with the help of 5 deep breaths from anesthesia.  All trochars were removed.  Each incision site was closed with a 4-0 Vicryl subcutaneous stitch.  Each incision was then covered with surgical skin glue.   The sponge on a stick was removed from the vagina.  The catheter was removed from the bladder.  The vagina was swept digitally to verify that no instruments or sponges remained.  The patient tolerated the procedure well.  Sponge, lap, needle, and instrument counts were correct x 2.  VTE prophylaxis: SCDs. Antibiotic prophylaxis: not indicated. She was awakened in the operating room and was taken to the PACU in stable condition.   Garnette Mace, MD 08/16/2024 8:34 AM

## 2024-08-16 NOTE — Transfer of Care (Signed)
 Immediate Anesthesia Transfer of Care Note  Patient: Brenda Fuentes  Procedure(s) Performed: SALPINGECTOMY, BILATERAL, LAPAROSCOPIC (Bilateral: Abdomen)  Patient Location: PACU  Anesthesia Type:General  Level of Consciousness: drowsy and patient cooperative  Airway & Oxygen Therapy: Patient Spontanous Breathing and Patient connected to face mask oxygen  Post-op Assessment: Report given to RN and Post -op Vital signs reviewed and stable  Post vital signs: Reviewed and stable  Last Vitals:  Vitals Value Taken Time  BP 140/64 08/16/24 08:46  Temp    Pulse 72 08/16/24 08:49  Resp 25 08/16/24 08:49  SpO2 98 % 08/16/24 08:49  Vitals shown include unfiled device data.  Last Pain:  Vitals:   08/16/24 0627  TempSrc: Temporal  PainSc: 0-No pain         Complications: No notable events documented.

## 2024-08-17 ENCOUNTER — Encounter: Payer: Self-pay | Admitting: Obstetrics and Gynecology

## 2024-08-19 LAB — SURGICAL PATHOLOGY

## 2024-08-21 ENCOUNTER — Encounter: Payer: Self-pay | Admitting: Obstetrics and Gynecology

## 2024-10-14 ENCOUNTER — Other Ambulatory Visit: Payer: Self-pay | Admitting: Physician Assistant

## 2024-10-14 DIAGNOSIS — G35D Multiple sclerosis, unspecified: Secondary | ICD-10-CM

## 2024-10-18 ENCOUNTER — Ambulatory Visit
Admission: RE | Admit: 2024-10-18 | Discharge: 2024-10-18 | Disposition: A | Discharge status: Home / Self Care | Source: Ambulatory Visit | Attending: Physician Assistant | Admitting: Physician Assistant

## 2024-10-18 DIAGNOSIS — G35D Multiple sclerosis, unspecified: Secondary | ICD-10-CM

## 2024-10-18 MED ORDER — GADOBUTROL 1 MMOL/ML IV SOLN
9.0000 mL | Freq: Once | INTRAVENOUS | Status: AC | PRN
  Administered 2024-10-18: 9 mL via INTRAVENOUS

## 2025-01-06 ENCOUNTER — Inpatient Hospital Stay
Admission: EM | Admit: 2025-01-06 | Discharge: 2025-01-09 | DRG: 440 | Disposition: A | Discharge status: Home / Self Care

## 2025-01-06 ENCOUNTER — Other Ambulatory Visit: Payer: Self-pay

## 2025-01-06 ENCOUNTER — Emergency Department

## 2025-01-06 DIAGNOSIS — F32A Depression, unspecified: Secondary | ICD-10-CM | POA: Diagnosis present

## 2025-01-06 DIAGNOSIS — Z6837 Body mass index (BMI) 37.0-37.9, adult: Secondary | ICD-10-CM | POA: Diagnosis not present

## 2025-01-06 DIAGNOSIS — Z880 Allergy status to penicillin: Secondary | ICD-10-CM

## 2025-01-06 DIAGNOSIS — Z83438 Family history of other disorder of lipoprotein metabolism and other lipidemia: Secondary | ICD-10-CM | POA: Diagnosis not present

## 2025-01-06 DIAGNOSIS — G90A Postural orthostatic tachycardia syndrome (POTS): Secondary | ICD-10-CM | POA: Diagnosis present

## 2025-01-06 DIAGNOSIS — E7800 Pure hypercholesterolemia, unspecified: Secondary | ICD-10-CM | POA: Diagnosis present

## 2025-01-06 DIAGNOSIS — Z302 Encounter for sterilization: Secondary | ICD-10-CM

## 2025-01-06 DIAGNOSIS — E66812 Obesity, class 2: Secondary | ICD-10-CM | POA: Diagnosis present

## 2025-01-06 DIAGNOSIS — D509 Iron deficiency anemia, unspecified: Secondary | ICD-10-CM | POA: Diagnosis present

## 2025-01-06 DIAGNOSIS — G35D Multiple sclerosis, unspecified: Secondary | ICD-10-CM | POA: Diagnosis present

## 2025-01-06 DIAGNOSIS — K8500 Idiopathic acute pancreatitis without necrosis or infection: Secondary | ICD-10-CM

## 2025-01-06 DIAGNOSIS — Z79620 Long term (current) use of immunosuppressive biologic: Secondary | ICD-10-CM

## 2025-01-06 DIAGNOSIS — Z8249 Family history of ischemic heart disease and other diseases of the circulatory system: Secondary | ICD-10-CM

## 2025-01-06 DIAGNOSIS — R569 Unspecified convulsions: Secondary | ICD-10-CM | POA: Diagnosis present

## 2025-01-06 DIAGNOSIS — Z833 Family history of diabetes mellitus: Secondary | ICD-10-CM | POA: Diagnosis not present

## 2025-01-06 DIAGNOSIS — Z79899 Other long term (current) drug therapy: Secondary | ICD-10-CM

## 2025-01-06 DIAGNOSIS — F419 Anxiety disorder, unspecified: Secondary | ICD-10-CM | POA: Diagnosis present

## 2025-01-06 DIAGNOSIS — K859 Acute pancreatitis without necrosis or infection, unspecified: Principal | ICD-10-CM | POA: Diagnosis present

## 2025-01-06 DIAGNOSIS — Z87891 Personal history of nicotine dependence: Secondary | ICD-10-CM

## 2025-01-06 LAB — CBC
HCT: 38.1 % (ref 36.0–46.0)
Hemoglobin: 12.8 g/dL (ref 12.0–15.0)
MCH: 25.5 pg — ABNORMAL LOW (ref 26.0–34.0)
MCHC: 33.6 g/dL (ref 30.0–36.0)
MCV: 75.9 fL — ABNORMAL LOW (ref 80.0–100.0)
Platelets: 419 10*3/uL — ABNORMAL HIGH (ref 150–400)
RBC: 5.02 MIL/uL (ref 3.87–5.11)
RDW: 15.6 % — ABNORMAL HIGH (ref 11.5–15.5)
WBC: 12.2 10*3/uL — ABNORMAL HIGH (ref 4.0–10.5)
nRBC: 0 % (ref 0.0–0.2)

## 2025-01-06 LAB — HEPATIC FUNCTION PANEL
ALT: 25 U/L (ref 0–44)
AST: 29 U/L (ref 15–41)
Albumin: 4.3 g/dL (ref 3.5–5.0)
Alkaline Phosphatase: 80 U/L (ref 38–126)
Bilirubin, Direct: 0.1 mg/dL (ref 0.0–0.2)
Indirect Bilirubin: 0.1 mg/dL — ABNORMAL LOW (ref 0.3–0.9)
Total Bilirubin: 0.2 mg/dL (ref 0.0–1.2)
Total Protein: 7.8 g/dL (ref 6.5–8.1)

## 2025-01-06 LAB — BASIC METABOLIC PANEL WITH GFR
BUN: 12 mg/dL (ref 6–20)
CO2: <7 mmol/L — ABNORMAL LOW (ref 22–32)
Calcium: 9.3 mg/dL (ref 8.9–10.3)
Chloride: 101 mmol/L (ref 98–111)
Creatinine, Ser: 0.62 mg/dL (ref 0.44–1.00)
GFR, Estimated: >60 mL/min
Glucose, Bld: 120 mg/dL — ABNORMAL HIGH (ref 70–99)
Potassium: 4.3 mmol/L (ref 3.5–5.1)
Sodium: 135 mmol/L (ref 135–145)

## 2025-01-06 LAB — LIPASE, BLOOD: Lipase: 1624 U/L — ABNORMAL HIGH (ref 11–51)

## 2025-01-06 LAB — POC URINE PREG, ED: Preg Test, Ur: NEGATIVE

## 2025-01-06 LAB — TROPONIN T, HIGH SENSITIVITY: Troponin T High Sensitivity: <6 ng/L (ref 0–19)

## 2025-01-06 MED ORDER — IOHEXOL 300 MG/ML  SOLN
100.0000 mL | Freq: Once | INTRAMUSCULAR | Status: AC | PRN
  Administered 2025-01-06: 100 mL via INTRAVENOUS

## 2025-01-06 MED ORDER — HYDROMORPHONE HCL 1 MG/ML IJ SOLN
1.0000 mg | Freq: Once | INTRAMUSCULAR | Status: AC
  Administered 2025-01-06: 1 mg via INTRAVENOUS
  Filled 2025-01-06: qty 1

## 2025-01-06 MED ORDER — HYDROMORPHONE HCL 1 MG/ML IJ SOLN
0.5000 mg | INTRAMUSCULAR | Status: DC | PRN
  Administered 2025-01-06 – 2025-01-09 (×8): 1 mg via INTRAVENOUS
  Filled 2025-01-06 (×8): qty 1

## 2025-01-06 MED ORDER — ACETAMINOPHEN 325 MG PO TABS
650.0000 mg | ORAL_TABLET | Freq: Four times a day (QID) | ORAL | Status: DC | PRN
  Filled 2025-01-06: qty 2

## 2025-01-06 MED ORDER — SODIUM CHLORIDE 0.9 % IV SOLN: Freq: Once | INTRAVENOUS | Status: AC

## 2025-01-06 MED ORDER — MORPHINE SULFATE (PF) 4 MG/ML IV SOLN
4.0000 mg | Freq: Once | INTRAVENOUS | Status: AC
  Administered 2025-01-06: 4 mg via INTRAVENOUS
  Filled 2025-01-06: qty 1

## 2025-01-06 MED ORDER — HYDROCODONE-ACETAMINOPHEN 5-325 MG PO TABS
1.0000 | ORAL_TABLET | ORAL | Status: DC | PRN
  Administered 2025-01-06 – 2025-01-08 (×7): 2 via ORAL
  Filled 2025-01-06 (×7): qty 2

## 2025-01-06 MED ORDER — SENNOSIDES-DOCUSATE SODIUM 8.6-50 MG PO TABS: 1.0000 | ORAL_TABLET | Freq: Every evening | ORAL | Status: DC | PRN

## 2025-01-06 MED ORDER — ONDANSETRON HCL 4 MG/2ML IJ SOLN
4.0000 mg | Freq: Four times a day (QID) | INTRAMUSCULAR | Status: DC | PRN
  Administered 2025-01-07 – 2025-01-08 (×2): 4 mg via INTRAVENOUS
  Filled 2025-01-06 (×2): qty 2

## 2025-01-06 MED ORDER — ONDANSETRON HCL 4 MG/2ML IJ SOLN
4.0000 mg | Freq: Once | INTRAMUSCULAR | Status: AC
  Administered 2025-01-06: 4 mg via INTRAVENOUS
  Filled 2025-01-06: qty 2

## 2025-01-06 MED ORDER — LACTATED RINGERS IV SOLN: INTRAVENOUS | Status: AC

## 2025-01-06 MED ORDER — ONDANSETRON HCL 4 MG PO TABS: 4.0000 mg | ORAL_TABLET | Freq: Four times a day (QID) | ORAL | Status: DC | PRN

## 2025-01-06 MED ORDER — ACETAMINOPHEN 650 MG RE SUPP: 650.0000 mg | Freq: Four times a day (QID) | RECTAL | Status: DC | PRN

## 2025-01-06 NOTE — ED Notes (Signed)
 Resumed care from [facility] rn.  Pt alert   iv fluids infusing  pt on cell phone

## 2025-01-06 NOTE — ED Provider Notes (Signed)
----------------------------------------- °  6:05 PM on 01/06/2025 -----------------------------------------  I took over care of this patient from Dr. Arlander.  The LFTs are normal.  CT shows pancreatitis but no other acute findings.  CT abdomen/pelvis:  IMPRESSION:  1. Focal acute pancreatitis involving the uncinate process, with surrounding  edema extending into the right anterior pararenal space and no loculated  collections or pancreatic ductal dilatation.     Based on lab workup and imaging there is no evidence of gallstone pancreatitis or any other biliary obstruction.  On reassessment, the patient reports continued pain.  She will need admission for further management.  I consulted the hospitalist; based on our discussion she agrees to evaluate the patient for admission.  She also mentions that her father has had recurrent episodes of pancreatitis even though he is not an alcohol drinker.     Jacolyn Pae, MD 01/06/25 1807

## 2025-01-06 NOTE — ED Notes (Signed)
 Pt moved to cpod   report off to [facility] paramedic

## 2025-01-06 NOTE — ED Notes (Signed)
 Meds given  waiting on admission

## 2025-01-06 NOTE — ED Provider Notes (Signed)
 "  [facility] Provider Note    Event Date/Time   First MD Initiated Contact with Patient 01/06/25 1357     (approximate)   History   Abdominal Pain   HPI  Brenda Fuentes is a 29 y.o. female with a history of multiple sclerosis, POTS who presents with complaints of right upper quadrant and upper abdominal pain which started this morning.  She reports the pain is severe.  No history of the same.  No history of abdominal surgery.  Does not drink alcohol daily.     Physical Exam   Triage Vital Signs: ED Triage Vitals  Encounter Vitals Group     BP 01/06/25 1326 (!) 147/99     Girls Systolic BP Percentile --      Girls Diastolic BP Percentile --      Boys Systolic BP Percentile --      Boys Diastolic BP Percentile --      Pulse Rate 01/06/25 1326 99     Resp 01/06/25 1326 17     Temp 01/06/25 1326 98.2 F (36.8 C)     Temp src --      SpO2 01/06/25 1326 100 %     Weight 01/06/25 1327 96.6 kg (212 lb 15.4 oz)     Height 01/06/25 1327 1.676 m (5' 6)     Head Circumference --      Peak Flow --      Pain Score 01/06/25 1327 9     Pain Loc --      Pain Education --      Exclude from Growth Chart --     Most recent vital signs: Vitals:   01/06/25 1326  BP: (!) 147/99  Pulse: 99  Resp: 17  Temp: 98.2 F (36.8 C)  SpO2: 100%     General: Awake, no distress.  CV:  Good peripheral perfusion.  Resp:  Normal effort.  Abd:  No distention.  Tenderness in the epigastrium and right upper quad Other:     ED Results / Procedures / Treatments   Labs (all labs ordered are listed, but only abnormal results are displayed) Labs Reviewed  BASIC METABOLIC PANEL WITH GFR - Abnormal; Notable for the following components:      Result Value   CO2 <7 (*)    Glucose, Bld 120 (*)    All other components within normal limits  CBC - Abnormal; Notable for the following components:   WBC 12.2 (*)    MCV 75.9 (*)    MCH 25.5 (*)    RDW 15.6 (*)     Platelets 419 (*)    All other components within normal limits  LIPASE, BLOOD - Abnormal; Notable for the following components:   Lipase 1,624 (*)    All other components within normal limits  HEPATIC FUNCTION PANEL  POC URINE PREG, ED  TROPONIN T, HIGH SENSITIVITY     EKG ED ECG REPORT I, Lamar Price, the attending physician, personally viewed and interpreted this ECG.  Date: 01/06/2025  Rhythm: normal sinus rhythm QRS Axis: normal Intervals: normal ST/T Wave abnormalities: normal Narrative Interpretation: no evidence of acute ischemia     RADIOLOGY Chest x-ray viewed interpret by me, no acute abnormality, confirmed by radiology    PROCEDURES:  Critical Care performed:   Procedures   MEDICATIONS ORDERED IN ED: Medications  morphine  (PF) 4 MG/ML injection 4 mg (4 mg Intravenous Given 01/06/25 1436)  ondansetron  (ZOFRAN ) injection 4 mg (4  mg Intravenous Given 01/06/25 1435)  0.9 %  sodium chloride  infusion ( Intravenous New Bag/Given 01/06/25 1444)     IMPRESSION / MDM / ASSESSMENT AND PLAN / ED COURSE  I reviewed the triage vital signs and the nursing notes. Patient's presentation is most consistent with acute presentation with potential threat to life or bodily function.  Patient presents with upper abdominal pain/right upper quadrant pain as detailed above.  Suspicious for cholecystitis versus pancreatitis versus gastritis  Pending labs, will treat with IV morphine , IV Zofran , IV fluids  Lipase is noted to be elevated  Will send for CT abdomen pelvis to evaluate further  Have asked my colleague to follow-up on imaging and reevaluate, anticipate admission        FINAL CLINICAL IMPRESSION(S) / ED DIAGNOSES   Final diagnoses:  Acute pancreatitis, unspecified complication status, unspecified pancreatitis type     Rx / DC Orders   ED Discharge Orders     None        Note:  This document was prepared using Dragon voice recognition software  and may include unintentional dictation errors.   Arlander Charleston, MD 01/06/25 1447  "

## 2025-01-06 NOTE — ED Notes (Signed)
 Poct pregnancy Negative

## 2025-01-06 NOTE — ED Triage Notes (Signed)
 Pt comes in via pov with complaints of right rib pain. Pt states that she picked her daughter on Saturday, felt like she pulled a muscle. PT complaints of upper abdominal pain, nausea, and upper abdominal swelling at this time. Pt complains of pain 9/10, and pain with trying to breath.

## 2025-01-06 NOTE — H&P (Signed)
 " History and Physical    Patient: Brenda Fuentes FMW:[id number] DOB: 1996/10/18 DOA: 01/06/2025 DOS: the patient was seen and examined on 01/06/2025 PCP: Sherial Bail, MD  Patient coming from: Home  Chief Complaint:  Chief Complaint  Patient presents with   Abdominal Pain   HPI: Brenda Fuentes is a 29 y.o. female with medical history significant of MS, POTS presents to the emergency department for evaluation of sudden onset, constant, nonradiating epigastric abdominal pain started this morning after breakfast.  She reports associated nausea with vomiting.  No fevers, diarrhea, hematemesis.  She denies recent alcohol use.  In the ED she was hemodynamically stable, afebrie. WBC 12. Lipase >1600. CT with acute pancreatitis.  Review of Systems: Review of Systems  Constitutional:  Negative for chills, fever, malaise/fatigue and weight loss.  Respiratory:  Negative for cough, sputum production and shortness of breath.   Cardiovascular:  Negative for chest pain, palpitations, claudication and leg swelling.  Gastrointestinal:  Positive for abdominal pain, nausea and vomiting. Negative for constipation and diarrhea.  Genitourinary:  Negative for dysuria and hematuria.  Musculoskeletal:  Negative for falls and joint pain.  Skin:  Negative for itching and rash.  Neurological:  Negative for dizziness, tingling, tremors, focal weakness and weakness.  Psychiatric/Behavioral:  Negative for hallucinations, substance abuse and suicidal ideas.     Past Medical History:  Diagnosis Date   Anemia    Anxiety    Depression 2016   Family history of adverse reaction to anesthesia    sister had issues but not sure what happen   Gestational hypertension    Headache    Hypercholesteremia 2009   Multiple sclerosis    POTS (postural orthostatic tachycardia syndrome)    TB lung, latent    Past Surgical History:  Procedure Laterality Date   LAPAROSCOPIC BILATERAL SALPINGECTOMY Bilateral  08/16/2024   Procedure: SALPINGECTOMY, BILATERAL, LAPAROSCOPIC;  Surgeon: Leonce Garnette BIRCH, MD;  Location: [facility] ORS;  Service: Gynecology;  Laterality: Bilateral;   NO PAST SURGERIES     WISDOM TOOTH EXTRACTION     Social History:  reports that she quit smoking about 6 years ago. Her smoking use included cigarettes. She smoked an average of 0.5 packs per day. She has never used smokeless tobacco. She reports current alcohol use. She reports that she does not use drugs.  Allergies[1]  Family History  Problem Relation Age of Onset   Hyperlipidemia Mother    Heart failure Father    Diabetes Father     Prior to Admission medications  Medication Sig Start Date End Date Taking? Authorizing Provider  FLUoxetine  (PROZAC ) 20 MG capsule Take 20 mg by mouth daily.    [provider]  HYDROcodone -acetaminophen  (NORCO/VICODIN) 5-325 MG tablet Take 1 tablet by mouth every 6 (six) hours as needed (breakthrough pain). 08/16/24   Leonce Garnette BIRCH, MD  ibuprofen  (ADVIL ) 600 MG tablet Take 1 tablet (600 mg total) by mouth every 6 (six) hours. 08/16/24   Leonce Garnette BIRCH, MD  lamoTRIgine  200 MG TBDP Take 1 tablet by mouth daily.    [provider]  metoprolol  tartrate (LOPRESSOR ) 25 MG tablet Take 1 tablet (25 mg total) by mouth 2 (two) times daily. 03/05/24 05/11/24  Liboon, Jazmine, CNM  Ofatumumab (KESIMPTA) 20 MG/0.4ML SOAJ Inject into the skin every 30 (thirty) days.    [provider]    Physical Exam: Vitals:   01/06/25 1508 01/06/25 1509 01/06/25 1657 01/06/25 1747  BP: (!) 142/78   (!) 140/96  Pulse:  87 85 93  Resp:      Temp:      SpO2:  99% 100% 100%  Weight:      Height:       Physical Exam Vitals and nursing note reviewed.  HENT:     Head: Normocephalic and atraumatic.  Eyes:     Extraocular Movements: Extraocular movements intact.     Pupils: Pupils are equal, round, and reactive to light.  Cardiovascular:     Rate and Rhythm: Normal rate and regular  rhythm.  Pulmonary:     Effort: Pulmonary effort is normal. No respiratory distress.     Breath sounds: Normal breath sounds.  Abdominal:     General: Bowel sounds are normal. There is no distension.     Palpations: Abdomen is soft.     Tenderness: There is abdominal tenderness in the epigastric area.  Skin:    General: Skin is warm and dry.  Neurological:     General: No focal deficit present.     Mental Status: She is alert and oriented to person, place, and time.  Psychiatric:        Mood and Affect: Mood normal.        Behavior: Behavior normal.     Data Reviewed:   Labs on Admission: I have personally reviewed following labs and imaging studies  CBC: Recent Labs  Lab 01/06/25 1331  WBC 12.2*  HGB 12.8  HCT 38.1  MCV 75.9*  PLT 419*   Basic Metabolic Panel: Recent Labs  Lab 01/06/25 1331  NA 135  K 4.3  CL 101  CO2 <7*  GLUCOSE 120*  BUN 12  CREATININE 0.62  CALCIUM  9.3   GFR: Estimated Creatinine Clearance: 124.8 mL/min (by C-G formula based on SCr of 0.62 mg/dL). Liver Function Tests: Recent Labs  Lab 01/06/25 1331  AST 29  ALT 25  ALKPHOS 80  BILITOT 0.2  PROT 7.8  ALBUMIN 4.3   Recent Labs  Lab 01/06/25 1331  LIPASE 1,624*   No results for input(s): AMMONIA  in the last 168 hours. Coagulation Profile: No results for input(s): INR, PROTIME in the last 168 hours. Cardiac Enzymes: No results for input(s): CKTOTAL, CKMB, CKMBINDEX, TROPONINI in the last 168 hours. BNP (last 3 results) No results for input(s): PROBNP in the last 8760 hours. HbA1C: No results for input(s): HGBA1C in the last 72 hours. CBG: No results for input(s): GLUCAP in the last 168 hours. Lipid Profile: No results for input(s): CHOL, HDL, LDLCALC, TRIG, CHOLHDL, LDLDIRECT in the last 72 hours. Thyroid  Function Tests: No results for input(s): TSH, T4TOTAL, FREET4, T3FREE, THYROIDAB in the last 72 hours. Anemia Panel: No  results for input(s): VITAMINB12, FOLATE, FERRITIN, TIBC, IRON, RETICCTPCT in the last 72 hours. Urine analysis:    Component Value Date/Time   COLORURINE YELLOW (A) 06/07/2024 1425   APPEARANCEUR CLOUDY (A) 06/07/2024 1425   APPEARANCEUR Cloudy (A) 02/04/2019 1014   LABSPEC 1.027 06/07/2024 1425   LABSPEC 1.019 07/23/2014 1420   PHURINE 6.0 06/07/2024 1425   GLUCOSEU NEGATIVE 06/07/2024 1425   GLUCOSEU Negative 07/23/2014 1420   HGBUR NEGATIVE 06/07/2024 1425   BILIRUBINUR NEGATIVE 06/07/2024 1425   BILIRUBINUR Negative 08/09/2019 1118   BILIRUBINUR Negative 02/04/2019 1014   BILIRUBINUR Negative 07/23/2014 1420   KETONESUR NEGATIVE 06/07/2024 1425   PROTEINUR NEGATIVE 06/07/2024 1425   UROBILINOGEN 0.2 08/09/2019 1118   NITRITE NEGATIVE 06/07/2024 1425   LEUKOCYTESUR TRACE (A) 06/07/2024 1425   LEUKOCYTESUR 2+ 07/23/2014  1420    Radiological Exams on Admission: CT ABDOMEN PELVIS W CONTRAST Result Date: 01/06/2025 EXAM: CT ABDOMEN AND PELVIS WITH CONTRAST 01/06/2025 03:46:48 PM TECHNIQUE: CT of the abdomen and pelvis was performed with the administration of 100 mL of iohexol  (OMNIPAQUE ) 300 MG/ML solution. Multiplanar reformatted images are provided for review. Automated exposure control, iterative reconstruction, and/or weight-based adjustment of the mA/kV was utilized to reduce the radiation dose to as low as reasonably achievable. COMPARISON: Comparison with 02/06/2021. CLINICAL HISTORY: Pancreatitis, acute, severe. Acute severe pancreatitis. Right rib pain and upper abdominal pain after injury on Saturday. Feels like a pulled muscle. FINDINGS: LOWER CHEST: Mild dependent atelectasis in the lung bases. LIVER: Diffuse fatty infiltration of the liver. GALLBLADDER AND BILE DUCTS: Gallbladder and bile ducts are normal. No biliary ductal dilatation. SPLEEN: Spleen is normal. PANCREAS: The pancreas is mildly enlarged without a focal lesion. There is edema around the uncinate  process of the pancreas and extending around the 3rd and 4th portions of the duodenum into the right anterior pararenal space. This is likely focal acute pancreatitis. The duodenal wall is not thickened suggesting duodenitis less likely. No loculated collections. No pancreatic ductal dilatation. ADRENAL GLANDS: Adrenal glands are normal. KIDNEYS, URETERS AND BLADDER: Kidneys, ureters, and the bladder are normal. No stones in the kidneys or ureters. No hydronephrosis. No perinephric or periureteral stranding. GI AND BOWEL: The stomach, small bowel, and colon are mostly decompressed with minimal scattered stool in the colon. The appendix is normal. There is no bowel obstruction. PERITONEUM AND RETROPERITONEUM: No ascites. No free air. VASCULATURE: Normal caliber abdominal aorta. LYMPH NODES: No significant lymphadenopathy. No retroperitoneal lymphadenopathy. REPRODUCTIVE ORGANS: The uterus and ovaries are not enlarged. BONES AND SOFT TISSUES: No acute bony abnormalities. No focal soft tissue abnormality. IMPRESSION: 1. Focal acute pancreatitis involving the uncinate process, with surrounding edema extending into the right anterior pararenal space and no loculated collections or pancreatic ductal dilatation. Electronically signed by: Elsie Gravely MD 01/06/2025 04:13 PM EST RP Workstation: [id number]   DG Chest 2 View Result Date: 01/06/2025 CLINICAL DATA:  left upper abdominal pain EXAM: CHEST - 2 VIEW COMPARISON:  Chest XR, 03/04/2024.  CTA chest, 01/18/2023 FINDINGS: Cardiomediastinal silhouette is within normal limits. Lungs are well inflated. No focal consolidation or mass. No pleural effusion or pneumothorax. No acute displaced fracture. IMPRESSION: No acute cardiopulmonary process. Electronically Signed   By: Thom Hall M.D.   On: 01/06/2025 13:57       Assessment and Plan:  Acute uncomplicated pancreatitis  - idiopathic,  no etoh use or hepatobiliary abnormality on CT, LFTs wnl  - CLD, advance  as tolerated  - IVF  - supportive care  Stable chronic conditions:  Non-epileptic seizures  Mood disorder Relapse remitting MS  - resume home medications when reconciled   SCD's  CLD advance as tolerated  LR@150    Advance Care Planning:   Code Status: Prior  discussed with patient at time of admission     Severity of Illness: The appropriate patient status for this patient is INPATIENT. Inpatient status is judged to be reasonable and necessary in order to provide the required intensity of service to ensure the patient's safety. The patient's presenting symptoms, physical exam findings, and initial radiographic and laboratory data in the context of their chronic comorbidities is felt to place them at high risk for further clinical deterioration. Furthermore, it is not anticipated that the patient will be medically stable for discharge from the hospital within 2 midnights of admission.   *  I certify that at the point of admission it is my clinical judgment that the patient will require inpatient hospital care spanning beyond 2 midnights from the point of admission due to high intensity of service, high risk for further deterioration and high frequency of surveillance required.*  Author: Daved JAYSON Pump, DO 01/06/2025 6:03 PM  For on call review [email or web address].      [1]  Allergies Allergen Reactions   Penicillins Hives   "

## 2025-01-07 DIAGNOSIS — K859 Acute pancreatitis without necrosis or infection, unspecified: Secondary | ICD-10-CM | POA: Diagnosis not present

## 2025-01-07 DIAGNOSIS — G35D Multiple sclerosis, unspecified: Secondary | ICD-10-CM | POA: Diagnosis not present

## 2025-01-07 LAB — COMPREHENSIVE METABOLIC PANEL WITH GFR
ALT: 20 U/L (ref 0–44)
AST: 20 U/L (ref 15–41)
Albumin: 3.9 g/dL (ref 3.5–5.0)
Alkaline Phosphatase: 62 U/L (ref 38–126)
Anion gap: 9 (ref 5–15)
BUN: 7 mg/dL (ref 6–20)
CO2: 26 mmol/L (ref 22–32)
Calcium: 8.8 mg/dL — ABNORMAL LOW (ref 8.9–10.3)
Chloride: 104 mmol/L (ref 98–111)
Creatinine, Ser: 0.6 mg/dL (ref 0.44–1.00)
GFR, Estimated: >60 mL/min
Glucose, Bld: 95 mg/dL (ref 70–99)
Potassium: 4.2 mmol/L (ref 3.5–5.1)
Sodium: 139 mmol/L (ref 135–145)
Total Bilirubin: 0.4 mg/dL (ref 0.0–1.2)
Total Protein: 6.9 g/dL (ref 6.5–8.1)

## 2025-01-07 MED ORDER — HEPARIN SODIUM (PORCINE) 5000 UNIT/ML IJ SOLN
5000.0000 [IU] | Freq: Two times a day (BID) | INTRAMUSCULAR | Status: DC
  Filled 2025-01-07 (×3): qty 1

## 2025-01-07 MED ORDER — METOPROLOL TARTRATE 25 MG PO TABS
25.0000 mg | ORAL_TABLET | Freq: Two times a day (BID) | ORAL | Status: DC
  Administered 2025-01-07 – 2025-01-09 (×5): 25 mg via ORAL
  Filled 2025-01-07 (×5): qty 1

## 2025-01-07 MED ORDER — FLUOXETINE HCL 20 MG PO CAPS
20.0000 mg | ORAL_CAPSULE | Freq: Every day | ORAL | Status: DC
  Administered 2025-01-07 – 2025-01-09 (×3): 20 mg via ORAL
  Filled 2025-01-07 (×3): qty 1

## 2025-01-07 MED ORDER — LAMOTRIGINE 100 MG PO TABS
200.0000 mg | ORAL_TABLET | Freq: Every day | ORAL | Status: DC
  Administered 2025-01-07 – 2025-01-09 (×3): 200 mg via ORAL
  Filled 2025-01-07 (×3): qty 2

## 2025-01-07 NOTE — Progress Notes (Signed)
 " PROGRESS NOTE    Brenda Fuentes  FMW:[id number] DOB: [birth year] DOA: 01/06/2025 PCP: Sherial Bail, MD  Chief Complaint  Patient presents with   Abdominal Pain    Hospital Course:  Brenda Fuentes is a 29 year old female with multiple sclerosis, POTS, anxiety, depression, who presents to the ED with epigastric abdominal pain.  She was found have lipase over 1600, CT scan consistent with acute pancreatitis.  She denies any alcohol and endorses a family history of chronic pancreatitis in her father.  Subjective: This morning patient reports that she continues to have abdominal pain.  We have discussed that ideally she should stop eating to give the pancreas time to rest.  She endorses understanding and agrees  Objective: Vitals:   01/06/25 1846 01/06/25 2006 01/07/25 0336 01/07/25 0916  BP:  (!) 120/91 122/79 119/66  Pulse:  88 84 87  Resp:  18 18 18   Temp:  98.5 F (36.9 C) 98.3 F (36.8 C) 97.9 F (36.6 C)  TempSrc:  Oral  Oral  SpO2: 100% 100% 100% 100%  Weight:  106.7 kg    Height:  5' 6 (1.676 m)      Intake/Output Summary (Last 24 hours) at 01/07/2025 1429 Last data filed at 01/07/2025 9046 Gross per 24 hour  Intake 2432.96 ml  Output --  Net 2432.96 ml   Filed Weights   01/06/25 1327 01/06/25 2006  Weight: 96.6 kg 106.7 kg    Examination: General exam: Appears calm and comfortable, NAD  Respiratory system: No work of breathing, symmetric chest wall expansion Cardiovascular system: S1 & S2 heard, RRR.  Gastrointestinal system: Abdomen is nondistended, soft, tender to palpation in epigastrium Psychiatry: Mood & affect appropriate for situation.   Assessment & Plan:  Principal Problem:   Pancreatitis, acute Active Problems:   MS (multiple sclerosis)   Acute pancreatitis - Appears to be idiopathic - Denies alcohol use, no LFT elevation, no evidence of gallstones - Home meds include Lamictal , Lopressor , Prozac .  None known to cause pancreatitis -  Was on CLD but is continuing to have abdominal pain.  Will make n.p.o. - Continue MIVF for now - Continue pain meds - Consider diet tomorrow if pain improves - Repeat CMP in AM.  Nonepileptic seizure disorder Mood disorder Depression and anxiety - Continue home meds  Relapsed remitting multiple sclerosis - Continue home meds. Follows with neuro  Iron deficiency anemia - Hemoglobin stable on arrival  Body mass index is 37.97 kg/m. Obesity Class 2 - Outpatient follow up for lifestyle modification and risk factor management   DVT prophylaxis: Heparin    Code Status: Full Code Disposition:  Inpatient pending clinical resolution   Consultants:    Procedures:    Antimicrobials:  Anti-infectives (From admission, onward)    None       Data Reviewed: I have personally reviewed following labs and imaging studies CBC: Recent Labs  Lab 01/06/25 1331  WBC 12.2*  HGB 12.8  HCT 38.1  MCV 75.9*  PLT 419*   Basic Metabolic Panel: Recent Labs  Lab 01/06/25 1331 01/07/25 0531  NA 135 139  K 4.3 4.2  CL 101 104  CO2 <7* 26  GLUCOSE 120* 95  BUN 12 7  CREATININE 0.62 0.60  CALCIUM  9.3 8.8*   GFR: Estimated Creatinine Clearance: 131.7 mL/min (by C-G formula based on SCr of 0.6 mg/dL). Liver Function Tests: Recent Labs  Lab 01/06/25 1331 01/07/25 0531  AST 29 20  ALT 25 20  ALKPHOS 80 62  BILITOT 0.2 0.4  PROT 7.8 6.9  ALBUMIN 4.3 3.9   CBG: No results for input(s): GLUCAP in the last 168 hours.  No results found for this or any previous visit (from the past 240 hours).   Radiology Studies: CT ABDOMEN PELVIS W CONTRAST Result Date: 01/06/2025 EXAM: CT ABDOMEN AND PELVIS WITH CONTRAST 01/06/2025 03:46:48 PM TECHNIQUE: CT of the abdomen and pelvis was performed with the administration of 100 mL of iohexol  (OMNIPAQUE ) 300 MG/ML solution. Multiplanar reformatted images are provided for review. Automated exposure control, iterative reconstruction, and/or  weight-based adjustment of the mA/kV was utilized to reduce the radiation dose to as low as reasonably achievable. COMPARISON: Comparison with 02/06/2021. CLINICAL HISTORY: Pancreatitis, acute, severe. Acute severe pancreatitis. Right rib pain and upper abdominal pain after injury on Saturday. Feels like a pulled muscle. FINDINGS: LOWER CHEST: Mild dependent atelectasis in the lung bases. LIVER: Diffuse fatty infiltration of the liver. GALLBLADDER AND BILE DUCTS: Gallbladder and bile ducts are normal. No biliary ductal dilatation. SPLEEN: Spleen is normal. PANCREAS: The pancreas is mildly enlarged without a focal lesion. There is edema around the uncinate process of the pancreas and extending around the 3rd and 4th portions of the duodenum into the right anterior pararenal space. This is likely focal acute pancreatitis. The duodenal wall is not thickened suggesting duodenitis less likely. No loculated collections. No pancreatic ductal dilatation. ADRENAL GLANDS: Adrenal glands are normal. KIDNEYS, URETERS AND BLADDER: Kidneys, ureters, and the bladder are normal. No stones in the kidneys or ureters. No hydronephrosis. No perinephric or periureteral stranding. GI AND BOWEL: The stomach, small bowel, and colon are mostly decompressed with minimal scattered stool in the colon. The appendix is normal. There is no bowel obstruction. PERITONEUM AND RETROPERITONEUM: No ascites. No free air. VASCULATURE: Normal caliber abdominal aorta. LYMPH NODES: No significant lymphadenopathy. No retroperitoneal lymphadenopathy. REPRODUCTIVE ORGANS: The uterus and ovaries are not enlarged. BONES AND SOFT TISSUES: No acute bony abnormalities. No focal soft tissue abnormality. IMPRESSION: 1. Focal acute pancreatitis involving the uncinate process, with surrounding edema extending into the right anterior pararenal space and no loculated collections or pancreatic ductal dilatation. Electronically signed by: Elsie Gravely MD 01/06/2025  04:13 PM EST RP Workstation: [id number]   DG Chest 2 View Result Date: 01/06/2025 CLINICAL DATA:  left upper abdominal pain EXAM: CHEST - 2 VIEW COMPARISON:  Chest XR, 03/04/2024.  CTA chest, 01/18/2023 FINDINGS: Cardiomediastinal silhouette is within normal limits. Lungs are well inflated. No focal consolidation or mass. No pleural effusion or pneumothorax. No acute displaced fracture. IMPRESSION: No acute cardiopulmonary process. Electronically Signed   By: Thom Hall M.D.   On: 01/06/2025 13:57    Scheduled Meds:  FLUoxetine   20 mg Oral Daily   lamoTRIgine   200 mg Oral Daily   metoprolol  tartrate  25 mg Oral BID   Continuous Infusions:  lactated ringers  100 mL/hr at 01/07/25 0915     LOS: 1 day  MDM: Patient is high risk for one or more organ failure.  They necessitate ongoing hospitalization for continued IV therapies and subsequent lab monitoring. Total time spent interpreting labs and vitals, reviewing the medical record, coordinating care amongst consultants and care team members, directly assessing and discussing care with the patient and/or family: 55 min  Marvelous Woolford, DO [facility]  To contact the attending physician between 7A-7P please use Epic Chat. To contact the covering physician during after hours 7P-7A, please review Amion.  01/07/2025, 2:29 PM   *This document has been created with  the assistance of dictation software. Please excuse typographical errors. *   "

## 2025-01-07 NOTE — Plan of Care (Signed)
  Problem: Education: Goal: Knowledge of General Education information will improve Description: Including pain rating scale, medication(s)/side effects and non-pharmacologic comfort measures Outcome: Progressing   Problem: Activity: Goal: Risk for activity intolerance will decrease Outcome: Progressing   Problem: Coping: Goal: Level of anxiety will decrease Outcome: Progressing   Problem: Elimination: Goal: Will not experience complications related to urinary retention Outcome: Progressing   Problem: Pain Managment: Goal: General experience of comfort will improve and/or be controlled Outcome: Progressing

## 2025-01-08 LAB — COMPREHENSIVE METABOLIC PANEL WITH GFR
ALT: 33 U/L (ref 0–44)
AST: 28 U/L (ref 15–41)
Albumin: 4 g/dL (ref 3.5–5.0)
Alkaline Phosphatase: 87 U/L (ref 38–126)
Anion gap: 10 (ref 5–15)
BUN: 6 mg/dL (ref 6–20)
CO2: 25 mmol/L (ref 22–32)
Calcium: 8.9 mg/dL (ref 8.9–10.3)
Chloride: 102 mmol/L (ref 98–111)
Creatinine, Ser: 0.63 mg/dL (ref 0.44–1.00)
GFR, Estimated: >60 mL/min
Glucose, Bld: 94 mg/dL (ref 70–99)
Potassium: 4.2 mmol/L (ref 3.5–5.1)
Sodium: 136 mmol/L (ref 135–145)
Total Bilirubin: 0.7 mg/dL (ref 0.0–1.2)
Total Protein: 7.3 g/dL (ref 6.5–8.1)

## 2025-01-08 LAB — CBC
HCT: 35.9 % — ABNORMAL LOW (ref 36.0–46.0)
Hemoglobin: 10.7 g/dL — ABNORMAL LOW (ref 12.0–15.0)
MCH: 23.4 pg — ABNORMAL LOW (ref 26.0–34.0)
MCHC: 29.8 g/dL — ABNORMAL LOW (ref 30.0–36.0)
MCV: 78.6 fL — ABNORMAL LOW (ref 80.0–100.0)
Platelets: 367 10*3/uL (ref 150–400)
RBC: 4.57 MIL/uL (ref 3.87–5.11)
RDW: 15.9 % — ABNORMAL HIGH (ref 11.5–15.5)
WBC: 13.4 10*3/uL — ABNORMAL HIGH (ref 4.0–10.5)
nRBC: 0 % (ref 0.0–0.2)

## 2025-01-08 LAB — PHOSPHORUS: Phosphorus: 2.9 mg/dL (ref 2.5–4.6)

## 2025-01-08 LAB — LIPASE, BLOOD: Lipase: 248 U/L — ABNORMAL HIGH (ref 11–51)

## 2025-01-08 LAB — MAGNESIUM: Magnesium: 2 mg/dL (ref 1.7–2.4)

## 2025-01-08 MED ORDER — SUMATRIPTAN SUCCINATE 50 MG PO TABS
100.0000 mg | ORAL_TABLET | ORAL | Status: DC | PRN
  Administered 2025-01-08: 100 mg via ORAL
  Filled 2025-01-08: qty 2

## 2025-01-08 MED ORDER — LEVOTHYROXINE SODIUM 50 MCG PO TABS
75.0000 ug | ORAL_TABLET | Freq: Every day | ORAL | Status: DC
  Administered 2025-01-09: 75 ug via ORAL
  Filled 2025-01-08: qty 2

## 2025-01-08 NOTE — Progress Notes (Signed)
 " PROGRESS NOTE    Brenda Fuentes  FMW:[id number] DOB: 08-25-1996 DOA: 01/06/2025 PCP: Sherial Bail, MD  Chief Complaint  Patient presents with   Abdominal Pain    Hospital Course:  Brenda Fuentes is a 29 year old female with multiple sclerosis, POTS, anxiety, depression, who presents to the ED with epigastric abdominal pain.  She was found have lipase over 1600, CT scan consistent with acute pancreatitis.  She denies any alcohol and endorses a family history of chronic pancreatitis in her father.  Subjective: Abdominal pain slightly better. Ready to start po trial.  No new concerns.   Objective: Vitals:   01/07/25 0916 01/07/25 2300 01/08/25 0320 01/08/25 0700  BP: 119/66 121/84 107/68 115/61  Pulse: 87 89 79 78  Resp: 18 18 18 20   Temp: 97.9 F (36.6 C) 98.2 F (36.8 C) 99.2 F (37.3 C) 98 F (36.7 C)  TempSrc: Oral Oral  Oral  SpO2: 100% 98% 93% 99%  Weight:      Height:        Intake/Output Summary (Last 24 hours) at 01/08/2025 1202 Last data filed at 01/07/2025 2300 Gross per 24 hour  Intake 180 ml  Output --  Net 180 ml   Filed Weights   01/06/25 1327 01/06/25 2006  Weight: 96.6 kg 106.7 kg    Examination: General exam: Appears calm and comfortable, NAD  Respiratory system: No work of breathing, symmetric chest wall expansion Cardiovascular system: S1 & S2 heard, RRR.  Gastrointestinal system: Abdomen is nondistended, soft, tender to palpation in epigastrium Psychiatry: Mood & affect appropriate for situation.   Assessment & Plan:  Principal Problem:   Pancreatitis, acute Active Problems:   MS (multiple sclerosis)   Acute pancreatitis Mild leucocytosis, likely reactive  - Appears to be idiopathic - Denies alcohol use, no LFT elevation, no evidence of gallstones - Home meds include Lamictal , Lopressor , Prozac .  None known to cause pancreatitis - Continue pain meds, IVF until able to tolerate orally  - Start on Clears and advance diet as  tolerated  - Monitor LFT's  Nonepileptic seizure disorder Mood disorder Depression and anxiety - Continue home meds  Relapsed remitting multiple sclerosis - Continue home meds. Follows with neuro  Iron deficiency anemia - Hemoglobin stable on arrival  Body mass index is 37.97 kg/m. Obesity Class 2 - Outpatient follow up for lifestyle modification and risk factor management   DVT prophylaxis: Heparin    Code Status: Full Code Disposition:  Inpatient pending clinical resolution of symptoms.   Consultants:    Procedures:    Antimicrobials:  Anti-infectives (From admission, onward)    None       Data Reviewed: I have personally reviewed following labs and imaging studies CBC: Recent Labs  Lab 01/06/25 1331 01/08/25 0703  WBC 12.2* 13.4*  HGB 12.8 10.7*  HCT 38.1 35.9*  MCV 75.9* 78.6*  PLT 419* 367   Basic Metabolic Panel: Recent Labs  Lab 01/06/25 1331 01/07/25 0531 01/08/25 0703  NA 135 139 136  K 4.3 4.2 4.2  CL 101 104 102  CO2 <7* 26 25  GLUCOSE 120* 95 94  BUN 12 7 6   CREATININE 0.62 0.60 0.63  CALCIUM  9.3 8.8* 8.9  MG  --   --  2.0  PHOS  --   --  2.9   GFR: Estimated Creatinine Clearance: 131.7 mL/min (by C-G formula based on SCr of 0.63 mg/dL). Liver Function Tests: Recent Labs  Lab 01/06/25 1331 01/07/25 0531 01/08/25 0703  AST  29 20 28   ALT 25 20 33  ALKPHOS 80 62 87  BILITOT 0.2 0.4 0.7  PROT 7.8 6.9 7.3  ALBUMIN 4.3 3.9 4.0   CBG: No results for input(s): GLUCAP in the last 168 hours.  No results found for this or any previous visit (from the past 240 hours).   Radiology Studies: CT ABDOMEN PELVIS W CONTRAST Result Date: 01/06/2025 EXAM: CT ABDOMEN AND PELVIS WITH CONTRAST 01/06/2025 03:46:48 PM TECHNIQUE: CT of the abdomen and pelvis was performed with the administration of 100 mL of iohexol  (OMNIPAQUE ) 300 MG/ML solution. Multiplanar reformatted images are provided for review. Automated exposure control, iterative  reconstruction, and/or weight-based adjustment of the mA/kV was utilized to reduce the radiation dose to as low as reasonably achievable. COMPARISON: Comparison with 02/06/2021. CLINICAL HISTORY: Pancreatitis, acute, severe. Acute severe pancreatitis. Right rib pain and upper abdominal pain after injury on Saturday. Feels like a pulled muscle. FINDINGS: LOWER CHEST: Mild dependent atelectasis in the lung bases. LIVER: Diffuse fatty infiltration of the liver. GALLBLADDER AND BILE DUCTS: Gallbladder and bile ducts are normal. No biliary ductal dilatation. SPLEEN: Spleen is normal. PANCREAS: The pancreas is mildly enlarged without a focal lesion. There is edema around the uncinate process of the pancreas and extending around the 3rd and 4th portions of the duodenum into the right anterior pararenal space. This is likely focal acute pancreatitis. The duodenal wall is not thickened suggesting duodenitis less likely. No loculated collections. No pancreatic ductal dilatation. ADRENAL GLANDS: Adrenal glands are normal. KIDNEYS, URETERS AND BLADDER: Kidneys, ureters, and the bladder are normal. No stones in the kidneys or ureters. No hydronephrosis. No perinephric or periureteral stranding. GI AND BOWEL: The stomach, small bowel, and colon are mostly decompressed with minimal scattered stool in the colon. The appendix is normal. There is no bowel obstruction. PERITONEUM AND RETROPERITONEUM: No ascites. No free air. VASCULATURE: Normal caliber abdominal aorta. LYMPH NODES: No significant lymphadenopathy. No retroperitoneal lymphadenopathy. REPRODUCTIVE ORGANS: The uterus and ovaries are not enlarged. BONES AND SOFT TISSUES: No acute bony abnormalities. No focal soft tissue abnormality. IMPRESSION: 1. Focal acute pancreatitis involving the uncinate process, with surrounding edema extending into the right anterior pararenal space and no loculated collections or pancreatic ductal dilatation. Electronically signed by: Elsie Gravely MD 01/06/2025 04:13 PM EST RP Workstation: [id number]   DG Chest 2 View Result Date: 01/06/2025 CLINICAL DATA:  left upper abdominal pain EXAM: CHEST - 2 VIEW COMPARISON:  Chest XR, 03/04/2024.  CTA chest, 01/18/2023 FINDINGS: Cardiomediastinal silhouette is within normal limits. Lungs are well inflated. No focal consolidation or mass. No pleural effusion or pneumothorax. No acute displaced fracture. IMPRESSION: No acute cardiopulmonary process. Electronically Signed   By: Thom Hall M.D.   On: 01/06/2025 13:57    Scheduled Meds:  FLUoxetine   20 mg Oral Daily   heparin  injection (subcutaneous)  5,000 Units Subcutaneous Q12H   lamoTRIgine   200 mg Oral Daily   [START ON 01/09/2025] levothyroxine   75 mcg Oral QAC breakfast   metoprolol  tartrate  25 mg Oral BID   Continuous Infusions:     LOS: 2 days  MDM: Patient is high risk for one or more organ failure.  They necessitate ongoing hospitalization for continued IV therapies and subsequent lab monitoring. Total time spent interpreting labs and vitals, reviewing the medical record, coordinating care amongst consultants and care team members, directly assessing and discussing care with the patient and/or family: 55 min  Azelea Seguin R Lenice Koper, DO [facility]  To contact the  attending physician between 7A-7P please use Epic Chat. To contact the covering physician during after hours 7P-7A, please review Amion.  01/08/2025, 12:02 PM   *This document has been created with the assistance of dictation software. Please excuse typographical errors. *   "

## 2025-01-08 NOTE — Plan of Care (Signed)
  Problem: Nutrition: Goal: Adequate nutrition will be maintained Outcome: Progressing   Problem: Coping: Goal: Level of anxiety will decrease Outcome: Progressing   Problem: Pain Managment: Goal: General experience of comfort will improve and/or be controlled Outcome: Progressing

## 2025-01-08 NOTE — Plan of Care (Signed)

## 2025-01-08 NOTE — TOC CM/SW Note (Signed)
 Transition of Care [facility]) CM/SW Note    Transition of Care [facility]) - Inpatient Brief Assessment   Patient Details  Name: Brenda Fuentes MRN: [id number] Date of Birth: 02-08-1996  Transition of Care [facility]) CM/SW Contact:    Alfonso Rummer, LCSW Phone Number: 01/08/2025, 1:10 PM   Clinical Narrative:  KEN DELENA Rummer completed toc chart review. No toc needs at this time. Please contact should need arise.   Transition of Care Asessment: Insurance and Status: Insurance coverage has been reviewed Patient has primary care physician: Yes DERETHA, RADHIKA) Home environment has been reviewed: single family home   Prior/Current Home Services: No current home services Social Drivers of Health Review: SDOH reviewed no interventions necessary Readmission risk has been reviewed: No Transition of care needs: no transition of care needs at this time

## 2025-01-09 LAB — CBC WITH DIFFERENTIAL/PLATELET
Abs Immature Granulocytes: 0.07 10*3/uL (ref 0.00–0.07)
Basophils Absolute: 0.1 10*3/uL (ref 0.0–0.1)
Basophils Relative: 0 %
Eosinophils Absolute: 0.4 10*3/uL (ref 0.0–0.5)
Eosinophils Relative: 3 %
HCT: 36.5 % (ref 36.0–46.0)
Hemoglobin: 10.9 g/dL — ABNORMAL LOW (ref 12.0–15.0)
Immature Granulocytes: 1 %
Lymphocytes Relative: 11 %
Lymphs Abs: 1.3 10*3/uL (ref 0.7–4.0)
MCH: 23.3 pg — ABNORMAL LOW (ref 26.0–34.0)
MCHC: 29.9 g/dL — ABNORMAL LOW (ref 30.0–36.0)
MCV: 78 fL — ABNORMAL LOW (ref 80.0–100.0)
Monocytes Absolute: 1.3 10*3/uL — ABNORMAL HIGH (ref 0.1–1.0)
Monocytes Relative: 11 %
Neutro Abs: 8.9 10*3/uL — ABNORMAL HIGH (ref 1.7–7.7)
Neutrophils Relative %: 74 %
Platelets: 413 10*3/uL — ABNORMAL HIGH (ref 150–400)
RBC: 4.68 MIL/uL (ref 3.87–5.11)
RDW: 15.7 % — ABNORMAL HIGH (ref 11.5–15.5)
WBC: 11.9 10*3/uL — ABNORMAL HIGH (ref 4.0–10.5)
nRBC: 0 % (ref 0.0–0.2)

## 2025-01-09 LAB — MISC LABCORP TEST (SEND OUT): Labcorp test code: 83935

## 2025-01-09 MED ORDER — ENOXAPARIN SODIUM 60 MG/0.6ML IJ SOSY: 0.5000 mg/kg | PREFILLED_SYRINGE | INTRAMUSCULAR | Status: DC

## 2025-01-09 MED ORDER — HYDROCODONE-ACETAMINOPHEN 5-325 MG PO TABS: 1.0000 | ORAL_TABLET | Freq: Four times a day (QID) | ORAL | 0 refills | Status: AC | PRN

## 2025-01-09 NOTE — Plan of Care (Signed)

## 2025-01-09 NOTE — Discharge Summary (Addendum)
 " Physician Discharge Summary   Patient: Brenda Fuentes MRN: [id number] DOB: 03/11/1996  Admit date:     01/06/2025  Discharge date: 01/09/25  Discharge Physician: Brenda Fuentes Brenda Fuentes   PCP: Brenda Bail, MD   Recommendations at discharge:    Follow up with PCP and GI  Discharge Diagnoses: Principal Problem:   Pancreatitis, acute Active Problems:   MS (multiple sclerosis)  Resolved Problems:   * No resolved hospital problems. *  Hospital Course: 29 year old female with multiple sclerosis, POTS, anxiety, depression, who presents to the ED with epigastric abdominal pain. She was found have lipase over 1600, CT scan consistent with acute pancreatitis. She denies any alcohol and endorses a family history of chronic pancreatitis in her father. LFT's- WNL.Preg test negative. Treated conservatively with bowel rest, IV fluids and pain management with improvement in her symptoms. Her symptoms improved, tolerating diet at discharge. Patient advised to follow up with PCP, Outpatient referral for GI for follow up. Per patient request she was given prn norco for symptomatic management.   Assessment and Plan: No notes have been filed under this hospital service. Service: Hospitalist        Consultants: None  Procedures performed: None  Disposition: Home Diet recommendation:  Regular diet with low fat  DISCHARGE MEDICATION: Allergies as of 01/09/2025       Reactions   Penicillins Hives        Medication List     STOP taking these medications    traZODone 50 MG tablet Commonly known as: DESYREL   zonisamide 50 MG capsule Commonly known as: ZONEGRAN       TAKE these medications    atomoxetine 40 MG capsule Commonly known as: STRATTERA Take 40 mg by mouth daily.   FLUoxetine  20 MG capsule Commonly known as: PROZAC  Take 20 mg by mouth daily. What changed: Another medication with the same name was removed. Continue taking this medication, and follow the directions  you see here.   HYDROcodone -acetaminophen  5-325 MG tablet Commonly known as: NORCO/VICODIN Take 1 tablet by mouth every 6 (six) hours as needed for up to 5 days (breakthrough pain).   ibuprofen  600 MG tablet Commonly known as: ADVIL  Take 1 tablet (600 mg total) by mouth every 6 (six) hours.   Kesimpta 20 MG/0.4ML Soaj Generic drug: Ofatumumab Inject into the skin every 30 (thirty) days.   lamoTRIgine  200 MG Tbdp Take 1 tablet by mouth daily.   levothyroxine  75 MCG tablet Commonly known as: SYNTHROID  Take 75 mcg by mouth daily before breakfast.   metoprolol  tartrate 25 MG tablet Commonly known as: LOPRESSOR  Take 1 tablet (25 mg total) by mouth 2 (two) times daily.        Follow-up Information     Brenda Bail, MD. Schedule an appointment as soon as possible for a visit in 1 week(s).   Specialty: Internal Medicine Contact information: [address] [place] [address] [phone]                Discharge Exam: Brenda Fuentes   01/06/25 1327 01/06/25 2006  Weight: 96.6 kg 106.7 kg    General exam: Appears calm and comfortable, NAD  Respiratory system: No work of breathing, symmetric chest wall expansion Cardiovascular system: S1 & S2 heard, RRR.  Gastrointestinal system: Abdomen is nondistended, soft, no significant tenderness Psychiatry: Mood & affect appropriate for situation.    Condition at discharge: stable  The results of significant diagnostics from this hospitalization (including imaging, microbiology, ancillary and laboratory)  are listed below for reference.   Imaging Studies: CT ABDOMEN PELVIS W CONTRAST Result Date: 01/06/2025 EXAM: CT ABDOMEN AND PELVIS WITH CONTRAST 01/06/2025 03:46:48 PM TECHNIQUE: CT of the abdomen and pelvis was performed with the administration of 100 mL of iohexol  (OMNIPAQUE ) 300 MG/ML solution. Multiplanar reformatted images are provided for review. Automated exposure control, iterative reconstruction,  and/or weight-based adjustment of the mA/kV was utilized to reduce the radiation dose to as low as reasonably achievable. COMPARISON: Comparison with 02/06/2021. CLINICAL HISTORY: Pancreatitis, acute, severe. Acute severe pancreatitis. Right rib pain and upper abdominal pain after injury on Saturday. Feels like a pulled muscle. FINDINGS: LOWER CHEST: Mild dependent atelectasis in the lung bases. LIVER: Diffuse fatty infiltration of the liver. GALLBLADDER AND BILE DUCTS: Gallbladder and bile ducts are normal. No biliary ductal dilatation. SPLEEN: Spleen is normal. PANCREAS: The pancreas is mildly enlarged without a focal lesion. There is edema around the uncinate process of the pancreas and extending around the 3rd and 4th portions of the duodenum into the right anterior pararenal space. This is likely focal acute pancreatitis. The duodenal wall is not thickened suggesting duodenitis less likely. No loculated collections. No pancreatic ductal dilatation. ADRENAL GLANDS: Adrenal glands are normal. KIDNEYS, URETERS AND BLADDER: Kidneys, ureters, and the bladder are normal. No stones in the kidneys or ureters. No hydronephrosis. No perinephric or periureteral stranding. GI AND BOWEL: The stomach, small bowel, and colon are mostly decompressed with minimal scattered stool in the colon. The appendix is normal. There is no bowel obstruction. PERITONEUM AND RETROPERITONEUM: No ascites. No free air. VASCULATURE: Normal caliber abdominal aorta. LYMPH NODES: No significant lymphadenopathy. No retroperitoneal lymphadenopathy. REPRODUCTIVE ORGANS: The uterus and ovaries are not enlarged. BONES AND SOFT TISSUES: No acute bony abnormalities. No focal soft tissue abnormality. IMPRESSION: 1. Focal acute pancreatitis involving the uncinate process, with surrounding edema extending into the right anterior pararenal space and no loculated collections or pancreatic ductal dilatation. Electronically signed by: Brenda Gravely MD  01/06/2025 04:13 PM EST RP Workstation: [id number]   DG Chest 2 View Result Date: 01/06/2025 CLINICAL DATA:  left upper abdominal pain EXAM: CHEST - 2 VIEW COMPARISON:  Chest XR, 03/04/2024.  CTA chest, 01/18/2023 FINDINGS: Cardiomediastinal silhouette is within normal limits. Lungs are well inflated. No focal consolidation or mass. No pleural effusion or pneumothorax. No acute displaced fracture. IMPRESSION: No acute cardiopulmonary process. Electronically Signed   By: Thom Hall M.D.   On: 01/06/2025 13:57    Microbiology: Results for orders placed or performed during the hospital encounter of 06/04/24  OB RESULT CONSOLE Group B Strep     Status: None   Collection Time: 05/28/24 12:00 AM  Result Value Ref Range Status   GBS Negative  Final    Labs: CBC: Recent Labs  Lab 01/06/25 1331 01/08/25 0703 01/09/25 0912  WBC 12.2* 13.4* 11.9*  NEUTROABS  --   --  8.9*  HGB 12.8 10.7* 10.9*  HCT 38.1 35.9* 36.5  MCV 75.9* 78.6* 78.0*  PLT 419* 367 413*   Basic Metabolic Panel: Recent Labs  Lab 01/06/25 1331 01/07/25 0531 01/08/25 0703  NA 135 139 136  K 4.3 4.2 4.2  CL 101 104 102  CO2 <7* 26 25  GLUCOSE 120* 95 94  BUN 12 7 6   CREATININE 0.62 0.60 0.63  CALCIUM  9.3 8.8* 8.9  MG  --   --  2.0  PHOS  --   --  2.9   Liver Function Tests: Recent Labs  Lab 01/06/25  1331 01/07/25 0531 01/08/25 0703  AST 29 20 28   ALT 25 20 33  ALKPHOS 80 62 87  BILITOT 0.2 0.4 0.7  PROT 7.8 6.9 7.3  ALBUMIN 4.3 3.9 4.0   CBG: No results for input(s): GLUCAP in the last 168 hours.  Discharge time spent: less than 30 minutes.  Signed: Genell JONELLE Overcast, MD [facility] 01/09/2025 "

## 2025-01-09 NOTE — Plan of Care (Signed)

## 2025-01-09 NOTE — Hospital Course (Signed)
 29 year old female with multiple sclerosis, POTS, anxiety, depression, who presents to the ED with epigastric abdominal pain. She was found have lipase over 1600, CT scan consistent with acute pancreatitis. She denies any alcohol and endorses a family history of chronic pancreatitis in her father. LFT's- WNL.Preg test negative. Treated conservatively with bowel rest, IV fluids and pain management with improvement in her symptoms. Her symptoms improved, tolerating diet at discharge. Patient advised to follow up with PCP, Outpatient referral for GI for follow up. Per patient request she was given prn norco for symptomatic management.
